# Patient Record
Sex: Female | Born: 1935
Health system: Southern US, Community
[De-identification: ages and names within clinical notes are randomized; demographics above are authoritative.]

## PROBLEM LIST (undated history)

## (undated) DIAGNOSIS — F0390 Unspecified dementia without behavioral disturbance: Secondary | ICD-10-CM

## (undated) DIAGNOSIS — I10 Essential (primary) hypertension: Secondary | ICD-10-CM

## (undated) DIAGNOSIS — E785 Hyperlipidemia, unspecified: Secondary | ICD-10-CM

## (undated) DIAGNOSIS — H3530 Unspecified macular degeneration: Secondary | ICD-10-CM

## (undated) DIAGNOSIS — E039 Hypothyroidism, unspecified: Secondary | ICD-10-CM

## (undated) DIAGNOSIS — M1990 Unspecified osteoarthritis, unspecified site: Secondary | ICD-10-CM

## (undated) DIAGNOSIS — I739 Peripheral vascular disease, unspecified: Secondary | ICD-10-CM

## (undated) DIAGNOSIS — M545 Low back pain: Secondary | ICD-10-CM

## (undated) HISTORY — PX: LUMBAR FUSION: SHX111

## (undated) HISTORY — DX: Hypothyroidism, unspecified: E03.9

## (undated) HISTORY — DX: Unspecified macular degeneration: H35.30

## (undated) HISTORY — DX: Unspecified osteoarthritis, unspecified site: M19.90

## (undated) HISTORY — PX: TOTAL KNEE ARTHROPLASTY: SHX125

## (undated) HISTORY — DX: Essential (primary) hypertension: I10

## (undated) HISTORY — DX: Low back pain: M54.5

## (undated) HISTORY — DX: Hyperlipidemia, unspecified: E78.5

## (undated) HISTORY — DX: Peripheral vascular disease, unspecified: I73.9

---

## 2007-06-29 ENCOUNTER — Ambulatory Visit: Payer: Self-pay | Admitting: Internal Medicine

## 2007-07-06 ENCOUNTER — Ambulatory Visit: Payer: Self-pay

## 2007-11-23 ENCOUNTER — Ambulatory Visit: Payer: Self-pay | Admitting: Internal Medicine

## 2007-11-23 DIAGNOSIS — I10 Essential (primary) hypertension: Secondary | ICD-10-CM

## 2007-11-23 DIAGNOSIS — E785 Hyperlipidemia, unspecified: Secondary | ICD-10-CM

## 2007-11-23 DIAGNOSIS — E039 Hypothyroidism, unspecified: Secondary | ICD-10-CM | POA: Insufficient documentation

## 2007-11-23 DIAGNOSIS — M545 Low back pain, unspecified: Secondary | ICD-10-CM

## 2007-11-23 DIAGNOSIS — M1990 Unspecified osteoarthritis, unspecified site: Secondary | ICD-10-CM

## 2007-11-23 HISTORY — DX: Hyperlipidemia, unspecified: E78.5

## 2007-11-23 HISTORY — DX: Unspecified osteoarthritis, unspecified site: M19.90

## 2007-11-23 HISTORY — DX: Hypothyroidism, unspecified: E03.9

## 2007-11-23 HISTORY — DX: Low back pain, unspecified: M54.50

## 2007-11-23 HISTORY — DX: Essential (primary) hypertension: I10

## 2007-11-26 LAB — CONVERTED CEMR LAB
CO2: 30 meq/L (ref 19–32)
Calcium: 10.4 mg/dL (ref 8.4–10.5)
Cholesterol: 236 mg/dL (ref 0–200)
GFR calc non Af Amer: 65 mL/min
Glucose, Bld: 81 mg/dL (ref 70–99)
Hemoglobin, Urine: NEGATIVE
Potassium: 4.6 meq/L (ref 3.5–5.1)
Specific Gravity, Urine: 1.015 (ref 1.000–1.03)
TSH: 5.44 microintl units/mL (ref 0.35–5.50)
Total CHOL/HDL Ratio: 2.5
Urine Glucose: NEGATIVE mg/dL
pH: 6 (ref 5.0–8.0)

## 2008-05-23 ENCOUNTER — Ambulatory Visit: Payer: Self-pay | Admitting: Internal Medicine

## 2008-05-23 DIAGNOSIS — IMO0001 Reserved for inherently not codable concepts without codable children: Secondary | ICD-10-CM

## 2008-05-23 DIAGNOSIS — D485 Neoplasm of uncertain behavior of skin: Secondary | ICD-10-CM

## 2008-05-23 DIAGNOSIS — D692 Other nonthrombocytopenic purpura: Secondary | ICD-10-CM | POA: Insufficient documentation

## 2008-07-11 ENCOUNTER — Telehealth: Payer: Self-pay | Admitting: Internal Medicine

## 2008-07-11 DIAGNOSIS — I739 Peripheral vascular disease, unspecified: Secondary | ICD-10-CM

## 2008-07-11 HISTORY — DX: Peripheral vascular disease, unspecified: I73.9

## 2008-07-19 ENCOUNTER — Encounter (INDEPENDENT_AMBULATORY_CARE_PROVIDER_SITE_OTHER): Payer: Self-pay | Admitting: *Deleted

## 2008-08-01 ENCOUNTER — Ambulatory Visit: Payer: Self-pay | Admitting: Internal Medicine

## 2008-08-01 DIAGNOSIS — R197 Diarrhea, unspecified: Secondary | ICD-10-CM

## 2008-08-02 ENCOUNTER — Encounter: Payer: Self-pay | Admitting: Internal Medicine

## 2008-08-02 LAB — CONVERTED CEMR LAB
Crystals: NEGATIVE
Specific Gravity, Urine: <=1.005 (ref 1.000–1.03)
TSH: 2.43 microintl units/mL (ref 0.35–5.50)
Total Protein, Urine: NEGATIVE mg/dL
Urine Glucose: NEGATIVE mg/dL
Urobilinogen, UA: 0.2 (ref 0.0–1.0)
pH: 7 (ref 5.0–8.0)

## 2008-08-22 ENCOUNTER — Encounter: Payer: Self-pay | Admitting: Internal Medicine

## 2008-08-24 ENCOUNTER — Ambulatory Visit: Payer: Self-pay

## 2008-08-24 ENCOUNTER — Encounter: Payer: Self-pay | Admitting: Internal Medicine

## 2008-08-31 ENCOUNTER — Ambulatory Visit: Payer: Self-pay | Admitting: Internal Medicine

## 2008-09-04 LAB — CONVERTED CEMR LAB
ALT: 26 units/L (ref 0–35)
Albumin: 4.3 g/dL (ref 3.5–5.2)
BUN: 24 mg/dL — ABNORMAL HIGH (ref 6–23)
Basophils Relative: 0.4 % (ref 0.0–3.0)
Calcium: 10 mg/dL (ref 8.4–10.5)
Cholesterol: 260 mg/dL (ref 0–200)
Creatinine, Ser: 0.9 mg/dL (ref 0.4–1.2)
Direct LDL: 199.4 mg/dL
GFR calc non Af Amer: 64 mL/min
HDL: 86.1 mg/dL (ref >39.0)
Hemoglobin: 14 g/dL (ref 12.0–15.0)
MCV: 89.6 fL (ref 78.0–100.0)
Monocytes Absolute: 0.5 10*3/uL (ref 0.1–1.0)
Monocytes Relative: 9.7 % (ref 3.0–12.0)
Potassium: 3.8 meq/L (ref 3.5–5.1)
RBC: 4.52 M/uL (ref 3.87–5.11)
Total Bilirubin: 1 mg/dL (ref 0.3–1.2)
Total CHOL/HDL Ratio: 3
VLDL: 12 mg/dL (ref 0–40)
WBC: 4.9 10*3/uL (ref 4.5–10.5)

## 2008-09-05 ENCOUNTER — Ambulatory Visit: Payer: Self-pay | Admitting: Internal Medicine

## 2009-05-25 ENCOUNTER — Ambulatory Visit: Payer: Self-pay | Admitting: Internal Medicine

## 2009-05-25 ENCOUNTER — Encounter: Payer: Self-pay | Admitting: Adult Health

## 2009-05-25 LAB — CONVERTED CEMR LAB
Bilirubin Urine: NEGATIVE
Ketones, ur: NEGATIVE mg/dL
Specific Gravity, Urine: <=1.005 (ref 1.000–1.030)
Total Protein, Urine: NEGATIVE mg/dL
Urine Glucose: NEGATIVE mg/dL
pH: 7 (ref 5.0–8.0)

## 2009-08-01 ENCOUNTER — Ambulatory Visit: Payer: Self-pay | Admitting: Internal Medicine

## 2009-08-01 LAB — CONVERTED CEMR LAB
AST: 29 units/L (ref 0–37)
Albumin: 4.2 g/dL (ref 3.5–5.2)
Alkaline Phosphatase: 88 units/L (ref 39–117)
Basophils Absolute: 0 10*3/uL (ref 0.0–0.1)
Basophils Relative: 0.7 % (ref 0.0–3.0)
Bilirubin Urine: NEGATIVE
Bilirubin, Direct: 0.1 mg/dL (ref 0.0–0.3)
Creatinine, Ser: 0.8 mg/dL (ref 0.4–1.2)
Eosinophils Absolute: 0.1 10*3/uL (ref 0.0–0.7)
Eosinophils Relative: 2 % (ref 0.0–5.0)
Glucose, Bld: 80 mg/dL (ref 70–99)
HCT: 37.9 % (ref 36.0–46.0)
Lymphocytes Relative: 34.2 % (ref 12.0–46.0)
Lymphs Abs: 1.5 10*3/uL (ref 0.7–4.0)
Neutro Abs: 2.2 10*3/uL (ref 1.4–7.7)
Platelets: 209 10*3/uL (ref 150.0–400.0)
Sodium: 141 meq/L (ref 135–145)
TSH: 2.11 microintl units/mL (ref 0.35–5.50)
Total Bilirubin: 1.1 mg/dL (ref 0.3–1.2)
Total Protein, Urine: NEGATIVE mg/dL
Urine Glucose: NEGATIVE mg/dL
Urobilinogen, UA: 0.2 (ref 0.0–1.0)
Vitamin B-12: 1100 pg/mL — ABNORMAL HIGH (ref 211–911)

## 2009-09-19 ENCOUNTER — Ambulatory Visit: Payer: Self-pay | Admitting: Internal Medicine

## 2010-04-15 ENCOUNTER — Encounter: Payer: Self-pay | Admitting: Internal Medicine

## 2010-04-19 ENCOUNTER — Encounter: Payer: Self-pay | Admitting: Internal Medicine

## 2010-05-16 ENCOUNTER — Encounter: Payer: Self-pay | Admitting: Internal Medicine

## 2010-06-14 ENCOUNTER — Ambulatory Visit: Payer: Self-pay | Admitting: Internal Medicine

## 2010-06-14 LAB — CONVERTED CEMR LAB
BUN: 22 mg/dL (ref 6–23)
Basophils Relative: 0.4 % (ref 0.0–3.0)
CO2: 28 meq/L (ref 19–32)
CRP, High Sensitivity: 7.9 — ABNORMAL HIGH (ref 0.00–5.00)
Eosinophils Relative: 1.4 % (ref 0.0–5.0)
GFR calc non Af Amer: 69.29 mL/min (ref >60)
Lymphocytes Relative: 23.1 % (ref 12.0–46.0)
Lymphs Abs: 1.4 10*3/uL (ref 0.7–4.0)
MCHC: 33.5 g/dL (ref 30.0–36.0)
Monocytes Absolute: 0.5 10*3/uL (ref 0.1–1.0)
Neutrophils Relative %: 66.3 % (ref 43.0–77.0)
RDW: 14 % (ref 11.5–14.6)

## 2010-06-20 ENCOUNTER — Ambulatory Visit: Payer: Self-pay | Admitting: Internal Medicine

## 2010-06-21 ENCOUNTER — Telehealth: Payer: Self-pay | Admitting: Internal Medicine

## 2010-07-01 ENCOUNTER — Telehealth: Payer: Self-pay | Admitting: Internal Medicine

## 2010-07-05 ENCOUNTER — Ambulatory Visit: Payer: Self-pay | Admitting: Internal Medicine

## 2010-07-08 ENCOUNTER — Telehealth: Payer: Self-pay | Admitting: Internal Medicine

## 2010-07-09 ENCOUNTER — Ambulatory Visit: Payer: Self-pay | Admitting: Family Medicine

## 2010-07-09 ENCOUNTER — Encounter: Payer: Self-pay | Admitting: Internal Medicine

## 2010-07-09 LAB — CONVERTED CEMR LAB
Basophils Absolute: 0 10*3/uL (ref 0.0–0.1)
Direct LDL: 196.2 mg/dL
Eosinophils Relative: 3.8 % (ref 0.0–5.0)
HDL: 80.8 mg/dL (ref >39.00)
Hemoglobin: 12.4 g/dL (ref 12.0–15.0)
Lymphocytes Relative: 29 % (ref 12.0–46.0)
Lymphs Abs: 1.5 10*3/uL (ref 0.7–4.0)
MCHC: 34.6 g/dL (ref 30.0–36.0)
MCV: 90.9 fL (ref 78.0–100.0)
Neutro Abs: 3 10*3/uL (ref 1.4–7.7)
Platelets: 309 10*3/uL (ref 150.0–400.0)
TSH: 2.98 microintl units/mL (ref 0.35–5.50)
Total CHOL/HDL Ratio: 4
Transferrin: 264.8 mg/dL (ref 212.0–360.0)
Triglycerides: 45 mg/dL (ref 0.0–149.0)
VLDL: 9 mg/dL (ref 0.0–40.0)
WBC: 5.2 10*3/uL (ref 4.5–10.5)

## 2010-07-10 ENCOUNTER — Telehealth: Payer: Self-pay | Admitting: Internal Medicine

## 2010-08-26 ENCOUNTER — Encounter: Payer: Self-pay | Admitting: Internal Medicine

## 2010-08-27 ENCOUNTER — Ambulatory Visit: Payer: Self-pay

## 2010-08-27 ENCOUNTER — Encounter: Payer: Self-pay | Admitting: Internal Medicine

## 2010-10-03 ENCOUNTER — Ambulatory Visit: Payer: Self-pay | Admitting: Internal Medicine

## 2010-10-03 LAB — CONVERTED CEMR LAB
Basophils Relative: 0.7 % (ref 0.0–3.0)
CO2: 26 meq/L (ref 19–32)
Calcium: 10 mg/dL (ref 8.4–10.5)
Chloride: 98 meq/L (ref 96–112)
Eosinophils Absolute: 0.1 10*3/uL (ref 0.0–0.7)
Hemoglobin: 12.4 g/dL (ref 12.0–15.0)
Lymphocytes Relative: 18 % (ref 12.0–46.0)
MCHC: 34.6 g/dL (ref 30.0–36.0)
MCV: 89.5 fL (ref 78.0–100.0)
Neutro Abs: 7.1 10*3/uL (ref 1.4–7.7)
RBC: 3.99 M/uL (ref 3.87–5.11)
Sodium: 134 meq/L — ABNORMAL LOW (ref 135–145)

## 2010-10-05 ENCOUNTER — Encounter: Payer: Self-pay | Admitting: Internal Medicine

## 2010-10-09 ENCOUNTER — Telehealth: Payer: Self-pay | Admitting: Internal Medicine

## 2010-10-14 ENCOUNTER — Telehealth: Payer: Self-pay | Admitting: Internal Medicine

## 2010-10-14 ENCOUNTER — Telehealth (INDEPENDENT_AMBULATORY_CARE_PROVIDER_SITE_OTHER): Payer: Self-pay | Admitting: *Deleted

## 2010-10-16 ENCOUNTER — Telehealth (INDEPENDENT_AMBULATORY_CARE_PROVIDER_SITE_OTHER): Payer: Self-pay | Admitting: *Deleted

## 2010-11-14 ENCOUNTER — Telehealth (INDEPENDENT_AMBULATORY_CARE_PROVIDER_SITE_OTHER): Payer: Self-pay | Admitting: *Deleted

## 2010-11-18 ENCOUNTER — Telehealth (INDEPENDENT_AMBULATORY_CARE_PROVIDER_SITE_OTHER): Payer: Self-pay | Admitting: *Deleted

## 2011-01-15 NOTE — Progress Notes (Signed)
Summary: memory   Phone Note Call from Patient Call back at Home Phone [phone] Call back at [phone] cell   Caller: Daughter-Lisa [name] Summary of Call: pt's daughter [name] called about concerns for pt. Pt is having more frequent memory episodes and cannot remember day to day events without asking someone. Also pt is c/o diarrhea and "just not feeling well." Regarding MD's advisement Initial call taken by: Lisa Grills MA,  July 10, 2010 4:18 PM  Follow-up for Phone Call        I just saw the pt with her husband - they both had no major concernes re: Lisa [name]' memory ( I've asked). I'll be happy to see her and her dtr back for an OV again to talk about memory problem again and to discuss avail Rx Follow-up by: Tresa Garter MD,  July 10, 2010 5:30 PM  Additional Follow-up for Phone Call Additional follow up Details #1::        informed pt's daughter of MD's advisement Additional Follow-up by: Lisa Grills MA,  July 11, 2010 10:52 AM

## 2011-01-15 NOTE — Assessment & Plan Note (Signed)
Summary: DR AVP PT/NO CLINIC--GROIN PAIN--STC   Vital Signs:  Patient profile:   77 year old female Height:      66 inches (167.64 cm) Weight:      126 pounds (57.27 kg) BMI:     20.41 O2 Sat:      99 % on Room air Temp:     97.6 degrees F (36.44 degrees C) oral Pulse rate:   64 / minute Pulse rhythm:   regular Resp:     16 per minute BP sitting:   130 / 90  (left arm) Cuff size:   regular  Vitals Entered By: Lanier Prude, CMA(AAMA) (June 14, 2010 10:23 AM)  O2 Flow:  Room air CC: C/O LT HIP PAIN/INFLAMM POST SCREW PLACEMENT 8 WKS AGO Is Patient Diabetic? No Pain Assessment Patient in pain? yes     Location: hip Intensity: 2 Type: sharp   Primary Care Provider:  [facility]nikov  CC:  C/O LT HIP PAIN/INFLAMM POST SCREW PLACEMENT 8 WKS AGO.  History of Present Illness: New to me she states that she had screws placed in her left hip 8 weeks ago by an Ortho. in [place], [place] due to a complication from a prior THR. She has had persistent left hip ( groin ) pain for 4 weeks now and has not improved with PT. She is taking Meloxicam, tramadol, and Tylenol with relief and she does not want anything in addition to that for pain. The pain is worsened nby any weight-bearing activity and ROM in the left hip.  Preventive Screening-Counseling & Management  Alcohol-Tobacco     Alcohol drinks/day: 0     Smoking Status: never  Hep-HIV-STD-Contraception     Hepatitis Risk: no risk noted     HIV Risk: no risk noted     STD Risk: no risk noted  Medications Prior to Update: 1)  Vitamin D3 1000 Unit  Tabs (Cholecalciferol) .Marland Kitchen.. 1 Qd 2)  Synthroid 88 Mcg Tabs (Levothyroxine Sodium) .Marland Kitchen.. 1 Po Qd 3)  Premarin 0.625 Mg/gm Crea (Estrogens, Conjugated) .... Use 1 G Pv At Bedtime X 7 D, Then 1 G Pv Q 1 Wk 4)  Amlodipine Besylate 5 Mg Tabs (Amlodipine Besylate) .... Once Daily 5)  Meloxicam 7.5 Mg Tabs (Meloxicam) .... Once Daily 6)  Ocuvite  Tabs (Multiple Vitamins-Minerals) .... Two Times A  Day 7)  Travatan 0.004 % Soln (Travoprost) .... 2 Drops in Each Eye Once Daily 8)  Penlac 8 % Soln (Ciclopirox) .... Once Daily On Effected Nail X6-12 Months 9)  Benicar Hct 20-12.5 Mg Tabs (Olmesartan Medoxomil-Hctz) .Marland Kitchen.. 1 Daily By Mouth  Current Medications (verified): 1)  Vitamin D3 1000 Unit  Tabs (Cholecalciferol) .Marland Kitchen.. 1 Qd 2)  Synthroid 88 Mcg Tabs (Levothyroxine Sodium) .Marland Kitchen.. 1 Po Qd 3)  Premarin 0.625 Mg/gm Crea (Estrogens, Conjugated) .... Use 1 G Pv At Bedtime X 7 D, Then 1 G Pv Q 1 Wk 4)  Amlodipine Besylate 5 Mg Tabs (Amlodipine Besylate) .... Once Daily 5)  Meloxicam 7.5 Mg Tabs (Meloxicam) .... Once Daily 6)  Ocuvite  Tabs (Multiple Vitamins-Minerals) .... Two Times A Day 7)  Travatan 0.004 % Soln (Travoprost) .... 2 Drops in Each Eye Once Daily 8)  Penlac 8 % Soln (Ciclopirox) .... Once Daily On Effected Nail X6-12 Months 9)  Benicar Hct 20-12.5 Mg Tabs (Olmesartan Medoxomil-Hctz) .Marland Kitchen.. 1 Daily By Mouth  Allergies (verified): 1)  ! Synthroid (Levothyroxine Sodium)  Past History:  Past Medical History: Last updated: 05/23/2008 Hyperlipidemia Hypertension  Hypothyroidism Low back pain Osteoarthritis Macular degeneration Glaucoma  Past Surgical History: Last updated: 11/23/2007 Total knee replacement B Lumbar fusion  Family History: Last updated: 11/23/2007 Family History Hypertension  Social History: Last updated: 08/01/2008 Retired - Runner, broadcasting/film/video Married Never Smoked 2 children Alcohol use-yes  Risk Factors: Alcohol Use: 0 (06/14/2010)  Risk Factors: Smoking Status: never (06/14/2010)  Family History: Reviewed history from 11/23/2007 and no changes required. Family History Hypertension  Social History: Reviewed history from 08/01/2008 and no changes required. Retired Film/video editor Married Never Smoked 2 children Alcohol use-yes Hepatitis Risk:  no risk noted HIV Risk:  no risk noted STD Risk:  no risk noted  Review of Systems       The patient  complains of difficulty walking.  The patient denies anorexia, fever, weight loss, chest pain, abdominal pain, hematuria, suspicious skin lesions, enlarged lymph nodes, and angioedema.   General:  Denies chills, fever, loss of appetite, malaise, sweats, and weakness. MS:  Complains of joint pain; denies joint redness, joint swelling, loss of strength, low back pain, mid back pain, muscle aches, cramps, muscle weakness, and stiffness.  Physical Exam  General:  disheveled and mild distress.   Head:  normocephalic and atraumatic.   Mouth:  Oral mucosa and oropharynx without lesions or exudates.  Teeth in good repair. Neck:  No deformities, masses, or tenderness noted. Lungs:  Normal respiratory effort, chest expands symmetrically. Lungs are clear to auscultation, no crackles or wheezes. Heart:  Normal rate and regular rhythm. S1 and S2 normal without gallop, murmur, click, rub or other extra sounds. Abdomen:  Bowel sounds positive,abdomen soft and non-tender without masses, organomegaly or hernias noted. Msk:  the incision sight over the left lateral hip looks good, is intact and has no ttp, erythema, exudate. there is mild anterior hip ttp in the left groin but no hernia, mass, LAD, rash, crepitance. she ambulates with slight favoring of the LLE. Pulses:  R and L carotid,radial,femoral,dorsalis pedis and posterior tibial pulses are full and equal bilaterally Extremities:  No clubbing, cyanosis, edema, or deformity noted with normal full range of motion of all joints.   Neurologic:  No cranial nerve deficits noted. Station and gait are normal. Plantar reflexes are down-going bilaterally. DTRs are symmetrical throughout. Sensory, motor and coordinative functions appear intact. Skin:  turgor normal, color normal, no rashes, no suspicious lesions, no ecchymoses, no petechiae, no purpura, no ulcerations, and no edema.   Cervical Nodes:  no anterior cervical adenopathy and no posterior cervical adenopathy.    Axillary Nodes:  no R axillary adenopathy and no L axillary adenopathy.   Inguinal Nodes:  no R inguinal adenopathy and no L inguinal adenopathy.   Psych:  Oriented X3, memory intact for recent and remote, normally interactive, good eye contact, not depressed appearing, not agitated, not suicidal, and slightly anxious.     Impression & Recommendations:  Problem # 1:  OTH COMPLICATIONS DUE INTERNAL JOINT PROSTHESIS (ICD-996.77) Assessment New plain films do not show any disruption of the hardware Orders: Venipuncture (14782) Orthopedic Referral (Ortho) TLB-BMP (Basic Metabolic Panel-BMET) (80048-METABOL) TLB-CBC Platelet - w/Differential (85025-CBCD) TLB-Sedimentation Rate (ESR) (85652-ESR) TLB-CRP-High Sensitivity (C-Reactive Protein) (86140-FCRP) T-Hip Comp Left Min 2-views (73510TC) Radiology Referral (Radiology)  Problem # 2:  HIP PAIN, LEFT (ICD-719.45) Assessment: New she has a very slight increase in CRP and ESR so will get a CT scan to look for infection Her updated medication list for this problem includes:    Meloxicam 7.5 Mg Tabs (Meloxicam) ..... Once  daily  Orders: Venipuncture (16109) Orthopedic Referral (Ortho) TLB-BMP (Basic Metabolic Panel-BMET) (80048-METABOL) TLB-CBC Platelet - w/Differential (85025-CBCD) TLB-Sedimentation Rate (ESR) (85652-ESR) TLB-CRP-High Sensitivity (C-Reactive Protein) (86140-FCRP) T-Hip Comp Left Min 2-views (73510TC) Radiology Referral (Radiology)  Complete Medication List: 1)  Vitamin D3 1000 Unit Tabs (Cholecalciferol) .Marland Kitchen.. 1 qd 2)  Synthroid 88 Mcg Tabs (Levothyroxine sodium) .Marland Kitchen.. 1 po qd 3)  Premarin 0.625 Mg/gm Crea (Estrogens, conjugated) .... Use 1 g pv at bedtime x 7 d, then 1 g pv q 1 wk 4)  Amlodipine Besylate 5 Mg Tabs (Amlodipine besylate) .... Once daily 5)  Meloxicam 7.5 Mg Tabs (Meloxicam) .... Once daily 6)  Ocuvite Tabs (Multiple vitamins-minerals) .... Two times a day 7)  Travatan 0.004 % Soln (Travoprost) ....  2 drops in each eye once daily 8)  Penlac 8 % Soln (Ciclopirox) .... Once daily on effected nail x6-12 months 9)  Benicar Hct 20-12.5 Mg Tabs (Olmesartan medoxomil-hctz) .Marland Kitchen.. 1 daily by mouth  Patient Instructions: 1)  Please schedule a follow-up appointment in 2 weeks.  Appended Document: DR AVP PT/NO CLINIC--GROIN PAIN--STC

## 2011-01-15 NOTE — Progress Notes (Signed)
Summary: repeat labs  Phone Note Call from Patient Call back at Home Phone [phone]   Summary of Call: Patient left message on triage that she will be having blood work done at [facility] and remembers that something needed to be repeated. She wanted to do it while she is there. Please advise. Initial call taken by: Lucious Groves CMA,  July 08, 2010 4:26 PM  Follow-up for Phone Call        It is on the printed Rx that she has Follow-up by: Tresa Garter MD,  July 08, 2010 10:24 PM  Additional Follow-up for Phone Call Additional follow up Details #1::        Pt called specificially requesting CRP be repeated per lab results from 06/14/2010. This test was not included on lab Rx. Okay to call lab and add with Dx 996.77? Additional Follow-up by: Margaret Pyle, CMA,  July 09, 2010 9:30 AM    Additional Follow-up for Phone Call Additional follow up Details #2::    ok Follow-up by: Tresa Garter MD,  July 09, 2010 12:58 PM  Additional Follow-up for Phone Call Additional follow up Details #3:: Details for Additional Follow-up Action Taken: Per lab order 07/09/2010 CRP to be drawn. Additional Follow-up by: Margaret Pyle, CMA,  July 09, 2010 2:19 PM

## 2011-01-15 NOTE — Progress Notes (Signed)
Summary: MRI  Phone Note Call from Patient Call back at Home Phone [phone]   Summary of Call: Pt is requesting a copy of her MRI.  She says she is moving to [facility]. Initial call taken by: Jarome Lamas,  October 14, 2010 11:31 AM

## 2011-01-15 NOTE — Letter (Signed)
Summary: Date Range:04-15-10 to 05-16-10/[facility]  Date Range:04-15-10 to 05-16-10/[facility]   Imported By: Sherian Rein 07/02/2010 10:06:01  _____________________________________________________________________  External Attachment:    Type:   Image     Comment:   External Document

## 2011-01-15 NOTE — Progress Notes (Signed)
Summary: RESULTS  Phone Note Call from Patient   Caller: Lynden Ang - daughter [phone] Summary of Call: Pt's daughter called, Patient is requesting results of all labs. Pt continues to be weak, not eating well and no appetite.   Also - patient did not discuss her memory issues at office visit? Family is concerned about memory. Initial call taken by: Lamar Sprinkles, CMA,  October 09, 2010 9:24 AM  Follow-up for Phone Call        Keep return office visit  OK to mail labs Take her to ER if very sick Follow-up by: Tresa Garter MD,  October 09, 2010 2:22 PM  Additional Follow-up for Phone Call Additional follow up Details #1::        left mess to call office back................Marland KitchenLamar Sprinkles, CMA  October 09, 2010 5:51 PM     Additional Follow-up for Phone Call Additional follow up Details #2::    I informed pt's daughter, pt is trying to get back to [facility] and will call as needed  Follow-up by: Ami Bullins CMA,  October 10, 2010 8:45 AM

## 2011-01-15 NOTE — Progress Notes (Signed)
Summary: MRI results  Phone Note Call from Patient   Caller: Patient Summary of Call: pt is requesting MRI results.  Please advise Initial call taken by: Lanier Prude, [facility]),  October 14, 2010 2:54 PM  Follow-up for Phone Call        Where was it done and who orered it?I do not see an MRI in the chart.  I see a CT report from july. Follow-up by: Tresa Garter MD,  October 14, 2010 5:34 PM  Additional Follow-up for Phone Call Additional follow up Details #1::        Pt wanted cpoies of everything done from early June to now. Pt is moving to [place] for the winter and wnats to have records on hand for any OV in [place]. Pt was advised that this would need to be done through the Medical Records dept, pt expressed understanding and was transferred to Medical Records. Additional Follow-up by: Margaret Pyle, CMA,  October 16, 2010 9:51 AM

## 2011-01-15 NOTE — Progress Notes (Signed)
Summary: RESULTS  Phone Note Call from Patient Call back at Home Phone [phone]   Summary of Call: Patient is requesting results of last labs, xray and CT.  Initial call taken by: Lamar Sprinkles, CMA,  June 21, 2010 10:28 AM  Follow-up for Phone Call        ok to provide results Keep return office visit  Follow-up by: Tresa Garter MD,  June 21, 2010 5:57 PM  Additional Follow-up for Phone Call Additional follow up Details #1::        Patient notified. Additional Follow-up by: Lucious Groves,  June 24, 2010 2:02 PM

## 2011-01-15 NOTE — Assessment & Plan Note (Signed)
Summary: diarrhea x 3 days/plot pt, plot off/cd   Vital Signs:  Patient profile:   77 year old female Menstrual status:  postmenopausal Height:      66 inches Weight:      128.50 pounds BMI:     20.82 O2 Sat:      97 % on Room air Temp:     97.8 degrees F oral Pulse rate:   69 / minute Pulse rhythm:   regular Resp:     16 per minute BP sitting:   126 / 80  (left arm) Cuff size:   large  Vitals Entered By: Rock Nephew CMA (October 03, 2010 10:01 AM)  O2 Flow:  Room air CC: Patient c/o diarrhea x several weeks, Diarrhea Is Patient Diabetic? No Pain Assessment Patient in pain? no       Does patient need assistance? Functional Status Self care Ambulation Normal     Menstrual Status postmenopausal   Primary Care Germani Gavilanes:  Plotnikov  CC:  Patient c/o diarrhea x several weeks and Diarrhea.  History of Present Illness:  Diarrhea      This is an 77 year old woman who presents with Diarrhea.  The symptoms began 4 weeks ago.  The severity is described as moderate.  The patient reports 3 stools or less per day, watery/unformed stools, voluminous stools, and gradual onset of symptoms, but denies blood in stool, mucus in stool, greasy stools, malodorous stools, fecal urgency, fecal soiling, alternating diarrhea/constipation, nocturnal diarrhea, fasting diarrhea, bloating, gassiness, and abrupt onset of symptoms.  The patient denies fever, abdominal pain, abdominal cramps, nausea, vomiting, lightheadedness, increased thirst, weight loss, joint pains, and mouth ulcers.  The symptoms are worse with any food.  The symptoms are better with fasting.  Patient's risk factors for diarrhea include recent hospitalization.    Preventive Screening-Counseling & Management  Alcohol-Tobacco     Alcohol drinks/day: 0     Smoking Status: never     Tobacco Counseling: not indicated; no tobacco use  Hep-HIV-STD-Contraception     Hepatitis Risk: no risk noted     HIV Risk: no risk noted  STD Risk: no risk noted      Sexual History:  currently monogamous.        Drug Use:  never.        Blood Transfusions:  no.    Clinical Review Panels:  Immunizations   Last Flu Vaccine:  Fluvax 3+ (09/19/2009)   Last Pneumovax:  given (12/15/2004)  Lipid Management   Cholesterol:  292 (07/09/2010)   LDL (bad choesterol):  DEL (08/31/2008)   HDL (good cholesterol):  80.80 (07/09/2010)  Diabetes Management   Creatinine:  0.8 (06/14/2010)   Last Flu Vaccine:  Fluvax 3+ (09/19/2009)   Last Pneumovax:  given (12/15/2004)  CBC   WBC:  5.2 (07/09/2010)   RBC:  3.94 (07/09/2010)   Hgb:  12.4 (07/09/2010)   Hct:  35.8 (07/09/2010)   Platelets:  309.0 (07/09/2010)   MCV  90.9 (07/09/2010)   MCHC  34.6 (07/09/2010)   RDW  14.8 (07/09/2010)   PMN:  57.9 (07/09/2010)   Lymphs:  29.0 (07/09/2010)   Monos:  8.8 (07/09/2010)   Eosinophils:  3.8 (07/09/2010)   Basophil:  0.5 (07/09/2010)  Complete Metabolic Panel   Glucose:  84 (06/14/2010)   Sodium:  135 (06/14/2010)   Potassium:  4.6 (06/14/2010)   Chloride:  97 (06/14/2010)   CO2:  28 (06/14/2010)   BUN:  22 (06/14/2010)   Creatinine:  0.8 (06/14/2010)   Albumin:  4.2 (08/01/2009)   Total Protein:  7.8 (08/01/2009)   Calcium:  9.7 (06/14/2010)   Total Bili:  1.1 (08/01/2009)   Alk Phos:  88 (08/01/2009)   SGPT (ALT):  32 (08/01/2009)   SGOT (AST):  29 (08/01/2009)   Medications Prior to Update: 1)  Vitamin D3 1000 Unit  Tabs (Cholecalciferol) .Marland Kitchen.. 1 Qd 2)  Synthroid 88 Mcg Tabs (Levothyroxine Sodium) .Marland Kitchen.. 1 Po Qd 3)  Amlodipine Besylate 5 Mg Tabs (Amlodipine Besylate) .... Once Daily 4)  Meloxicam 7.5 Mg Tabs (Meloxicam) .... Once Daily 5)  Ocuvite  Tabs (Multiple Vitamins-Minerals) .... Two Times A Day 6)  Travatan 0.004 % Soln (Travoprost) .... 2 Drops in Each Eye Once Daily 7)  Vit B12, Lipids, Tsh, Vit D, Cbc, Iron/tibc .... Dx V70.0 782.0 780.79  Current Medications (verified): 1)  Vitamin D3 1000 Unit  Tabs  (Cholecalciferol) .Marland Kitchen.. 1 Qd 2)  Synthroid 88 Mcg Tabs (Levothyroxine Sodium) .Marland Kitchen.. 1 Po Qd 3)  Amlodipine Besylate 5 Mg Tabs (Amlodipine Besylate) .... Once Daily 4)  Meloxicam 7.5 Mg Tabs (Meloxicam) .... Once Daily 5)  Ocuvite  Tabs (Multiple Vitamins-Minerals) .... Two Times A Day 6)  Travatan 0.004 % Soln (Travoprost) .... 2 Drops in Each Eye Once Daily 7)  Vit B12, Lipids, Tsh, Vit D, Cbc, Iron/tibc .... Dx V70.0 782.0 780.79  Allergies (verified): 1)  ! 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 Family History Hypertension  Social History: Reviewed history from 08/01/2008 and no changes required. 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    Physical Exam  General:  alert, well-developed, well-nourished, well-hydrated, appropriate dress, normal appearance, and healthy-appearing.   Eyes:  no icterus, she does have blepharitis Mouth:  Oral mucosa and oropharynx without lesions or exudates.   Teeth in good repair. pharynx pink and moist.   Neck:  supple, full ROM, and no masses.   Lungs:  normal respiratory effort, no intercostal retractions, no accessory muscle use, normal breath sounds, no dullness, no fremitus, no crackles, and no wheezes.   Heart:  Normal rate and regular rhythm. S1 and S2 normal without gallop, murmur, click, rub or other extra sounds. Abdomen:  soft, non-tender, no distention, no masses, no guarding, no rigidity, no rebound tenderness, no abdominal hernia, no inguinal hernia, no hepatomegaly, no splenomegaly, and bowel sounds hyperactive.   Msk:  No deformity or scoliosis noted of thoracic or lumbar spine.   Pulses:  R and L carotid,radial,femoral,dorsalis pedis and posterior tibial pulses are full and equal bilaterally Extremities:  No clubbing, cyanosis, edema, or deformity noted with normal full range of motion of all joints.   Neurologic:  No cranial nerve deficits noted. Station and gait are normal.  Plantar reflexes are down-going bilaterally. DTRs are symmetrical throughout. Sensory, motor and coordinative functions appear intact. Skin:  turgor normal, color normal, no rashes, no suspicious lesions, no ecchymoses, no petechiae, no purpura, no ulcerations, and no edema.   Cervical Nodes:  no anterior cervical adenopathy and no posterior cervical adenopathy.   Inguinal Nodes:  no R inguinal adenopathy and no L inguinal adenopathy.   Psych:  Oriented X3, memory intact for recent and remote, normally interactive, good eye contact, not depressed appearing, not agitated, not suicidal, and not anxious.     Impression & Recommendations:  Problem # 1:  DIARRHEA (ICD-787.91) Assessment Deteriorated will check for infection, malabsorption, celiac dz.,  Orders: T-Sprue Panel (Celiac Disease Aby Eval) (83516x3/[address]) T-Stool Fats [place] Stain [phone]) T-Stool Giardia / Crypto- EIA (14782) T-Stool for O&P (95621-30865) T-Fecal WBC (78469-62952) T-Culture,  C-Diff Toxin A/B (84132-44010) T-Beta Carotene (27253-66440) Venipuncture (34742) TLB-BMP (Basic Metabolic Panel-BMET) (80048-METABOL) TLB-CBC Platelet - w/Differential (85025-CBCD) TLB-TSH (Thyroid Stimulating Hormone) (84443-TSH)  Problem # 2:  HYPOTHYROIDISM (ICD-244.9) Assessment: Unchanged  Her updated medication list for this problem includes:    Synthroid 88 Mcg Tabs (Levothyroxine sodium) .Marland Kitchen... 1 po qd  Orders: Venipuncture (59563) TLB-BMP (Basic Metabolic Panel-BMET) (80048-METABOL) TLB-CBC Platelet - w/Differential (85025-CBCD) TLB-TSH (Thyroid Stimulating Hormone) (84443-TSH)  Labs Reviewed: TSH: 2.98 (07/09/2010)    Chol: 292 (07/09/2010)   HDL: 80.80 (07/09/2010)   LDL: DEL (08/31/2008)   TG: 45.0 (07/09/2010)  Problem # 3:  HYPERTENSION (ICD-401.9) Assessment: Improved  Her updated medication list for this problem includes:    Amlodipine Besylate 5 Mg Tabs (Amlodipine besylate) ..... Once daily  Orders: Venipuncture (87564) TLB-BMP (Basic Metabolic Panel-BMET) (80048-METABOL) TLB-CBC Platelet - w/Differential (85025-CBCD) TLB-TSH (Thyroid Stimulating Hormone) (84443-TSH)  BP today: 126/80 Prior BP: 130/60 (07/05/2010)  Labs Reviewed: K+: 4.6 (06/14/2010) Creat: : 0.8 (06/14/2010)   Chol: 292 (07/09/2010)   HDL: 80.80 (07/09/2010)   LDL: DEL (08/31/2008)   TG: 45.0 (07/09/2010)  Complete Medication List: 1)  Vitamin D3 1000 Unit Tabs (Cholecalciferol) .Marland Kitchen.. 1 qd 2)  Synthroid 88 Mcg Tabs (Levothyroxine sodium) .Marland Kitchen.. 1 po qd 3)  Amlodipine Besylate 5 Mg Tabs (Amlodipine besylate) .... Once daily 4)  Meloxicam 7.5 Mg Tabs (Meloxicam) .... Once daily 5)  Ocuvite Tabs (Multiple vitamins-minerals) .... Two times a day 6)  Travatan 0.004 % Soln (Travoprost) .... 2 drops in each eye once daily 7)  Vit B12, Lipids, Tsh, Vit D, Cbc, Iron/tibc  .... Dx v70.0 782.0 780.79  Patient Instructions: 1)  Please schedule a follow-up appointment in 2 weeks. 2)  teh main  problem with gastroenteritis is dehydration. Drink plenty of fluids and take solids as you feel better. If you are unable to keep anything down and/or you show signs of dehydration(dry/cracked lips, lack of tears, not urinating, very sleepy), call our office.   Orders Added: 1)  T-Sprue Panel (Celiac Disease Aby Eval) [83516x3/[address]] 2)  T-Stool Fats [place] Stain [33295-18841] 3)  T-Stool Giardia / Crypto- EIA [66063] 4)  T-Stool for O&P [87177-70555] 5)  T-Fecal WBC [01601-09323] 6)  T-Culture, C-Diff Toxin A/B [55732-20254] 7)  T-Beta Carotene [82380-81790] 8)  Venipuncture [36415] 9)  TLB-BMP (Basic Metabolic Panel-BMET) [80048-METABOL] 10)  TLB-CBC Platelet - w/Differential [85025-CBCD] 11)  TLB-TSH (Thyroid Stimulating Hormone) [84443-TSH] 12)  Est. Patient Level IV [27062]

## 2011-01-15 NOTE — Miscellaneous (Signed)
Summary: Orders Update  Clinical Lists Changes  Orders: Added new Test order of Carotid Duplex (Carotid Duplex) - Signed 

## 2011-01-15 NOTE — Assessment & Plan Note (Signed)
Summary: 6 mos f/u #/cd   Vital Signs:  Patient profile:   77 year old female Height:      66 inches Weight:      129 pounds BMI:     20.90 O2 Sat:      99 % on Room air Temp:     97.2 degrees F oral Pulse rate:   67 / minute Pulse rhythm:   regular BP sitting:   130 / 60  (left arm) Cuff size:   regular  Vitals Entered By: Lanier Prude, CMA(AAMA) (July 05, 2010 9:16 AM)  O2 Flow:  Room air CC: 6 mo f/u Comments pt needs Rf on all meds. She is not taking Premarin, Ocuvite, Penlac or Benicar. Please remove from list   Primary Care Provider:  Plotnikov  CC:  6 mo f/u.  History of Present Illness: C/o L groin pain - not better; worse with moving x 2 wk C/o anemia  The patient presents for a follow up of hypertension, hyperlipidemia   Current Medications (verified): 1)  Vitamin D3 1000 Unit  Tabs (Cholecalciferol) .Marland Kitchen.. 1 Qd 2)  Synthroid 88 Mcg Tabs (Levothyroxine Sodium) .Marland Kitchen.. 1 Po Qd 3)  Premarin 0.625 Mg/gm Crea (Estrogens, Conjugated) .... Use 1 G Pv At Bedtime X 7 D, Then 1 G Pv Q 1 Wk 4)  Amlodipine Besylate 5 Mg Tabs (Amlodipine Besylate) .... Once Daily 5)  Meloxicam 7.5 Mg Tabs (Meloxicam) .... Once Daily 6)  Ocuvite  Tabs (Multiple Vitamins-Minerals) .... Two Times A Day 7)  Travatan 0.004 % Soln (Travoprost) .... 2 Drops in Each Eye Once Daily 8)  Penlac 8 % Soln (Ciclopirox) .... Once Daily On Effected Nail X6-12 Months 9)  Benicar Hct 20-12.5 Mg Tabs (Olmesartan Medoxomil-Hctz) .Marland Kitchen.. 1 Daily By Mouth  Allergies (verified): 1)  ! Synthroid (Levothyroxine Sodium)  Past History:  Past Medical History: Last updated: 05/23/2008 Hyperlipidemia Hypertension Hypothyroidism Low back pain Osteoarthritis Macular degeneration Glaucoma  Past Surgical History: Last updated: 11/23/2007 Total knee replacement B Lumbar fusion  Social History: Last updated: 08/01/2008 Retired - Runner, broadcasting/film/video Married Never Smoked 2 children Alcohol use-yes  Review of  Systems  The patient denies fever, weight loss, weight gain, dyspnea on exertion, and abdominal pain.    Physical Exam  General:  disheveled and mild distress.   Eyes:  Less redness of the eyes Nose:  External nasal examination shows no deformity or inflammation. Nasal mucosa are pink and moist without lesions or exudates. Mouth:  Oral mucosa and oropharynx without lesions or exudates.  Teeth in good repair. Neck:  No deformities, masses, or tenderness noted. Lungs:  Normal respiratory effort, chest expands symmetrically. Lungs are clear to auscultation, no crackles or wheezes. Heart:  Normal rate and regular rhythm. S1 and S2 normal without gallop, murmur, click, rub or other extra sounds. Abdomen:  Bowel sounds positive,abdomen soft and non-tender without masses, organomegaly or hernias noted. Msk:  the incision sight over the left lateral hip looks good, is intact and has no ttp, erythema, exudate. there is mild anterior hip ttp in the left groin but no hernia, mass, LAD, rash, crepitance. she ambulates with slight favoring of the LLE. L groin is tender to palp Extremities:  No clubbing, cyanosis, edema, or deformity noted with normal full range of motion of all joints.   Neurologic:  No cranial nerve deficits noted. Station and gait are normal. Plantar reflexes are down-going bilaterally. DTRs are symmetrical throughout. Sensory, motor and coordinative functions appear intact.  Impression & Recommendations:  Problem # 1:  HIP PAIN, LEFT (ICD-719.45)/L groin pain - poss ileopsoas tendonitis Assessment New  Her updated medication list for this problem includes:    Meloxicam 7.5 Mg Tabs (Meloxicam) ..... Once daily Hip CT was OK See "Patient Instructions".  Offered Prednisone or Flexeril  Orders: TLB-B12, Serum-Total ONLY (13086-V78) TLB-CBC Platelet - w/Differential (85025-CBCD) TLB-IBC Pnl (Iron/FE;Transferrin) (83550-IBC) TLB-TSH (Thyroid Stimulating Hormone)  (84443-TSH) T-Vitamin D (25-Hydroxy) (46962-95284)  Problem # 2:  FATIGUE (ICD-780.79) Assessment: Unchanged  Labs is pending   Problem # 3:  PARESTHESIA (ICD-782.0) Assessment: Comment Only Check B12  Problem # 4:  REDNESS OR DISCHARGE OF EYE (ICD-379.93) Assessment: Improved  Problem # 5:  HYPERTENSION (ICD-401.9) Assessment: Unchanged  The following medications were removed from the medication list:    Benicar Hct 20-12.5 Mg Tabs (Olmesartan medoxomil-hctz) .Marland Kitchen... 1 daily by mouth Her updated medication list for this problem includes:    Amlodipine Besylate 5 Mg Tabs (Amlodipine besylate) ..... Once daily  BP today: 130/60 Prior BP: 130/90 (06/14/2010)  Labs Reviewed: K+: 4.6 (06/14/2010) Creat: : 0.8 (06/14/2010)   Chol: 260 (08/31/2008)   HDL: 86.1 (08/31/2008)   LDL: DEL (08/31/2008)   TG: 58 (08/31/2008)  Problem # 6:  HYPERLIPIDEMIA (ICD-272.4) Assessment: Comment Only  Complete Medication List: 1)  Vitamin D3 1000 Unit Tabs (Cholecalciferol) .Marland Kitchen.. 1 qd 2)  Synthroid 88 Mcg Tabs (Levothyroxine sodium) .Marland Kitchen.. 1 po qd 3)  Amlodipine Besylate 5 Mg Tabs (Amlodipine besylate) .... Once daily 4)  Meloxicam 7.5 Mg Tabs (Meloxicam) .... Once daily 5)  Ocuvite Tabs (Multiple vitamins-minerals) .... Two times a day 6)  Travatan 0.004 % Soln (Travoprost) .... 2 drops in each eye once daily 7)  Vit B12, Lipids, Tsh, Vit D, Cbc, Iron/tibc  .... Dx v70.0 782.0 780.79  Other Orders: Prescription Created Electronically [phone])  Patient Instructions: 1)  Use a body pillow or a blanket roll to sleep with 2)  Use stretching and balance exercises that I have provided (15 min. or longer every day) 3)  Please schedule a follow-up appointment in 2 months. 4)  Ileopsoas strain Prescriptions: VIT B12, LIPIDS, TSH, VIT D, CBC, IRON/TIBC Dx v70.0 782.0 780.79  #1 x 0   Entered and Authorized by:   Tresa Garter MD   Signed by:   Tresa Garter MD on 07/05/2010   Method used:    Print then Give to Patient   RxID:   [id number] MELOXICAM 7.5 MG TABS (MELOXICAM) once daily  #90 x 4   Entered and Authorized by:   Tresa Garter MD   Signed by:   Tresa Garter MD on 07/05/2010   Method used:   Print then Give to Patient   RxID:   [id number] AMLODIPINE BESYLATE 5 MG TABS (AMLODIPINE BESYLATE) once daily  #90 x 4   Entered and Authorized by:   Tresa Garter MD   Signed by:   Tresa Garter MD on 07/05/2010   Method used:   Print then Give to Patient   RxID:   [id number] SYNTHROID 88 MCG TABS (LEVOTHYROXINE SODIUM) 1 po qd  #90 x 4   Entered and Authorized by:   Tresa Garter MD   Signed by:   Tresa Garter MD on 07/05/2010   Method used:   Print then Give to Patient   RxID:   [id number] VITAMIN D3 1000 UNIT  TABS (CHOLECALCIFEROL) 1 qd  #90 x 4   Entered and  Authorized by:   Tresa Garter MD   Signed by:   Tresa Garter MD on 07/05/2010   Method used:   Print then Give to Patient   RxID:   [id number] MELOXICAM 7.5 MG TABS (MELOXICAM) once daily  #90 x 4   Entered and Authorized by:   Tresa Garter MD   Signed by:   Tresa Garter MD on 07/05/2010   Method used:   Electronically to        [facility]  [address] (retail)       [address]       [place], [place]  [address]       Ph: [phone] or [phone]       Fax: [phone]   RxID:   [id number] AMLODIPINE BESYLATE 5 MG TABS (AMLODIPINE BESYLATE) once daily  #90 x 4   Entered and Authorized by:   Tresa Garter MD   Signed by:   Tresa Garter MD on 07/05/2010   Method used:   Electronically to        [facility]  [address] (retail)       [address]       [place], [place]  [address]       Ph: [phone] or [phone]       Fax: [phone]   RxID:   [id number] SYNTHROID 88 MCG TABS (LEVOTHYROXINE SODIUM) 1 po qd  #90 x 4   Entered and Authorized by:   Tresa Garter  MD   Signed by:   Tresa Garter MD on 07/05/2010   Method used:   [facility] to        [facility]  [address] (retail)       [address]       [place], [place]  [address]       Ph: [phone] or [phone]       Fax: [phone]   RxID:   [id number] VITAMIN D3 1000 UNIT  TABS (CHOLECALCIFEROL) 1 qd  #90 x 4   Entered and Authorized by:   Tresa Garter MD   Signed by:   Tresa Garter MD on 07/05/2010   Method used:   [facility]ron[facility] to        [facility] (retail)       [address]       [place], [place]  [address]       Ph: [phone] or [phone]       Fax: [phone]   RxID:   [id number]

## 2011-01-15 NOTE — Progress Notes (Signed)
  Phone Note Other Incoming   Request: Send information Summary of Call: Request received from [facility] forwarded to [employer].

## 2011-01-15 NOTE — Op Note (Signed)
Summary: [facility]  [facility]   Imported By: Sherian Rein 07/02/2010 10:15:42  _____________________________________________________________________  External Attachment:    Type:   Image     Comment:   External Document

## 2011-01-15 NOTE — Progress Notes (Signed)
  Phone Note Other Incoming   Request: Send information Summary of Call: Returned message from patient. Patient is leaving for [place], [place] and would like copies of her records from 06/2010 to present. She is unable to stop by and complete the form because she is leaving tomorrow. I offered to fax or e-mail her a release. The patient states, she does not have a fax and her e-mail is shut off until she arrives to [place]. The patient is requesting that the release form be mailed to [address] [place], [place] [address]. This was placed in the mail this morning.

## 2011-01-15 NOTE — Progress Notes (Signed)
  Phone Note Other Incoming   Request: Send information Summary of Call: [facility] faxed a release requesting and MRI report. Release was faxed back to [phone] with included statement that records requested were unavailable. There is no MRI report in the patient's chart nor in EMR.

## 2011-01-15 NOTE — Progress Notes (Signed)
Summary: resuklts  Phone Note Call from Patient   Caller: Patient Reason for Call: Lab or Test Results Summary of Call: Patient called requesting status of lab results/ she states she never received in the mail.Alvy Beal Archie CMA  July 01, 2010 9:05 AM   Follow-up for Phone Call        Mailed today, Pt informed  Follow-up by: Lamar Sprinkles, CMA,  July 01, 2010 3:21 PM

## 2011-04-29 NOTE — Assessment & Plan Note (Signed)
[facility]                           PRIMARY CARE OFFICE NOTE   Lisa [name], Lisa [name]                        MRN:          [id number]  DATE:06/30/2007                            DOB:          12-31-1927    The patient is a 76 year old female, who presents to establish primary  with me and to address chronic medical problems, including hypertension,  osteoarthritis, hypothyroidism and others.   PAST MEDICAL HISTORY:  Total knee replacement bilaterally, total hip  replacement bilaterally, history of spinal fusion, hypothyroidism,  hypertension.   ALLERGIES:  SYNTHROID, CERTAIN EYE DROPS.   CURRENT MEDICATIONS:  1. Doxycycline.  2. Cream for eyes.  3. Clindamycin eye drops p.r.n.  4. Synthroid 75 micrograms daily.  5. Meloxicam 7.5 mg daily.  6. Amlodipine 5 mg daily.  7. Ocuvite 2 drops each eye twice a day.  8. Travatan eye drops.  9. Centrum Silver.  10.Calcium and D.  11.Vitamin C.  12.Vitamin E.  13.Vitamin B12.  14.Red rice yeast extract for cholesterol.   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  Does not smoke.  No alcohol.  She has two children, moved  here from [place].   FAMILY HISTORY:  Father died with diabetes complications.  Mother died  with possible coronary disease or valve disease.   REVIEW OF SYSTEMS:  She has been exercising on a stationary bike.  Chronic joint problems with back and lower extremity pain.  Chronic  inflammation of the eyelids.  Health maintenance status seems to be up  to date.  The rest of the review of systems is negative.   PHYSICAL:  Blood pressure 167/68, pulse 61, temperature 98, weight 151  pounds.  She is in no acute distress.  HEENT:  Reveals erythematous eyelids bilaterally.  Eyeballs without  obvious inflammation.  NECK:  Supple, no thyromegaly.  Bruit on the left side.  LUNGS:  Clear, no wheezes or rales.  HEART:  S1, S2, no murmur, no  gallops.  ABDOMEN:  Soft, nontender.  LOWER EXTREMITIES:  Without edema.  She is alert and cooperative.  Denies being depressed.  SKIN:  Clear.   LABORATORY DATA:  On March 10, 2007, TSH 2.07, HDL 71, LDL 126, glucose  96.   ASSESSMENT AND PLAN:  1. Hypertension.  Continue current therapy.  2. Osteoarthritis.  Continue current therapy.  3. Hypothyroidism.  Continue current therapy.  4. Chronic conjunctivitis/blepharitis, chronic on therapy.  5. Elevated blood pressure.  States normal at home.  Call if elevated.      I will see her back in three months with laboratory work.  6. Carotid left bruit.  We will obtain ultrasound.  7. Health maintenance:  Colonoscopy three years ago.  Had Pap      smear/mammogram/chest x-ray recently.  Bone density scan normal in      April 2007.  Zostavax information provided.  Other shots up to      date.     Georgina Quint. Plotnikov, MD  Electronically Signed    AVP/MedQ  DD: 07/01/2007  DT:  07/02/2007  Job #: [id number]

## 2011-06-03 ENCOUNTER — Other Ambulatory Visit: Payer: Self-pay | Admitting: Internal Medicine

## 2011-06-24 ENCOUNTER — Telehealth: Payer: Self-pay | Admitting: *Deleted

## 2011-06-24 NOTE — Telephone Encounter (Signed)
Pt has apt tomorrow. Spoke w/daughter (who can not come w/her tomorrow)  1. Pt has worries about driving but husband "likes her to drive". Pt does not feel comfortable b/c she says she "cant see" 2. Pt has had 4 episodes where she "goes out" - non responsive for a couple minutes. Not all are after standing. One was after being in hot car, another while sitting at dinner table, and another after eating large meal. She was seen in hospital in [place]. Dx low sodium and kept overnight. EMS was called last Sunday to last episode but pt did not go to hospital. They are worried about her heart.  3. Pt constantly c/o "feeling like a wet dish rag"

## 2011-06-24 NOTE — Telephone Encounter (Signed)
Noted. Thx.

## 2011-06-25 ENCOUNTER — Ambulatory Visit: Payer: Self-pay | Admitting: Internal Medicine

## 2011-06-25 ENCOUNTER — Encounter: Payer: Self-pay | Admitting: Internal Medicine

## 2011-06-25 ENCOUNTER — Ambulatory Visit (INDEPENDENT_AMBULATORY_CARE_PROVIDER_SITE_OTHER): Payer: Medicare Other | Admitting: Internal Medicine

## 2011-06-25 VITALS — BP 150/70 | HR 80 | Temp 97.6°F | Resp 16 | Wt 130.0 lb

## 2011-06-25 DIAGNOSIS — F0280 Dementia in other diseases classified elsewhere without behavioral disturbance: Secondary | ICD-10-CM

## 2011-06-25 DIAGNOSIS — E871 Hypo-osmolality and hyponatremia: Secondary | ICD-10-CM | POA: Insufficient documentation

## 2011-06-25 DIAGNOSIS — R55 Syncope and collapse: Secondary | ICD-10-CM | POA: Insufficient documentation

## 2011-06-25 DIAGNOSIS — G309 Alzheimer's disease, unspecified: Secondary | ICD-10-CM

## 2011-06-25 NOTE — Patient Instructions (Signed)
Stop Aricept please Do not drive

## 2011-06-25 NOTE — Assessment & Plan Note (Signed)
EKG w/S brady Hosp records and labs and tests from [facility] were reviewed RTC 3 wks Stop Aricept Card cons if sx not resolved

## 2011-06-25 NOTE — Assessment & Plan Note (Signed)
Reduce water intake and recheck BMET in 3 wks

## 2011-06-25 NOTE — Progress Notes (Signed)
  Subjective:    Patient ID: Lisa [name], female    DOB: Oct 14, 1928, 77 y.o.   MRN: [id number]  HPI   1. Pt has worries about driving but husband "likes her to drive". Pt does not feel comfortable b/c she says she "cant see"  2. Pt has had 4 episodes where she "passed out" - non responsive for a couple minutes. Never hit her head. No warnings. Not all are after standing. One was after being in hot car, another while sitting at dinner table, and another after eating large meal. She was seen in hospital in [place], [place] for syncope #1. Dx low sodium and kept overnight. EMS was called last Sunday to last episode but pt did not go to hospital . They are worried about her heart.  3. Pt constantly c/o "feeling like a wet dish rag" however denied it today  The only new med is a dementia med started 3-4 mo ago    Review of Systems  Constitutional: Negative for chills, activity change, appetite change, fatigue and unexpected weight change.  HENT: Negative for congestion, mouth sores and sinus pressure.   Eyes: Negative for visual disturbance.  Respiratory: Negative for cough, chest tightness and shortness of breath.   Cardiovascular: Negative for chest pain, palpitations and leg swelling.  Gastrointestinal: Negative for nausea, abdominal pain, blood in stool and abdominal distention.  Genitourinary: Negative for dysuria, urgency, frequency, difficulty urinating and vaginal pain.  Musculoskeletal: Negative for back pain and gait problem.  Skin: Negative for pallor and rash.  Neurological: Negative for dizziness, tremors, seizures, weakness, light-headedness, numbness and headaches.  Psychiatric/Behavioral: Negative for confusion and sleep disturbance. The patient is not nervous/anxious.        Objective:   Physical Exam  Constitutional: She appears well-developed and well-nourished. No distress.       NAD  HENT:  Head: Normocephalic.  Right Ear: External ear normal.  Left Ear: External ear  normal.  Nose: Nose normal.  Mouth/Throat: Oropharynx is clear and moist.  Eyes: Conjunctivae are normal. Pupils are equal, round, and reactive to light. Right eye exhibits no discharge. Left eye exhibits no discharge.  Neck: Normal range of motion. Neck supple. No JVD present. No tracheal deviation present. No thyromegaly present.  Cardiovascular: Normal rate, regular rhythm and normal heart sounds.  Exam reveals no gallop.   No murmur heard. Pulmonary/Chest: No stridor. No respiratory distress. She has no wheezes.  Abdominal: Soft. Bowel sounds are normal. She exhibits no distension and no mass. There is no tenderness. There is no rebound and no guarding.  Musculoskeletal: She exhibits no edema and no tenderness.  Lymphadenopathy:    She has no cervical adenopathy.  Neurological: She displays normal reflexes. No cranial nerve deficit. She exhibits normal muscle tone. Coordination (ataxic) abnormal.  Skin: No rash noted. No erythema.  Psychiatric: She has a normal mood and affect. Her behavior is normal. Judgment and thought content normal.          Assessment & Plan:

## 2011-06-25 NOTE — Assessment & Plan Note (Signed)
May try Exelon later

## 2011-06-26 ENCOUNTER — Other Ambulatory Visit: Payer: Self-pay | Admitting: *Deleted

## 2011-06-26 MED ORDER — AMLODIPINE BESYLATE 5 MG PO TABS: 5.0000 mg | ORAL_TABLET | Freq: Every day | ORAL | Status: DC

## 2011-06-26 MED ORDER — LEVOTHYROXINE SODIUM 88 MCG PO TABS: 88.0000 ug | ORAL_TABLET | Freq: Every day | ORAL | Status: DC

## 2011-06-27 ENCOUNTER — Other Ambulatory Visit: Payer: Self-pay | Admitting: *Deleted

## 2011-06-27 MED ORDER — LEVOTHYROXINE SODIUM 88 MCG PO TABS: 88.0000 ug | ORAL_TABLET | Freq: Every day | ORAL | Status: DC

## 2011-06-27 MED ORDER — AMLODIPINE BESYLATE 5 MG PO TABS: 5.0000 mg | ORAL_TABLET | Freq: Every day | ORAL | Status: DC

## 2011-07-03 ENCOUNTER — Other Ambulatory Visit (INDEPENDENT_AMBULATORY_CARE_PROVIDER_SITE_OTHER): Payer: Medicare Other

## 2011-07-03 DIAGNOSIS — R55 Syncope and collapse: Secondary | ICD-10-CM

## 2011-07-03 LAB — BASIC METABOLIC PANEL
BUN: 28 mg/dL — ABNORMAL HIGH (ref 6–23)
Chloride: 103 mEq/L (ref 96–112)
Glucose, Bld: 82 mg/dL (ref 70–99)
Potassium: 4.5 mEq/L (ref 3.5–5.1)
Sodium: 138 mEq/L (ref 135–145)

## 2011-07-22 ENCOUNTER — Ambulatory Visit (INDEPENDENT_AMBULATORY_CARE_PROVIDER_SITE_OTHER): Payer: Medicare Other | Admitting: Internal Medicine

## 2011-07-22 ENCOUNTER — Encounter: Payer: Self-pay | Admitting: Internal Medicine

## 2011-07-22 DIAGNOSIS — R5381 Other malaise: Secondary | ICD-10-CM

## 2011-07-22 DIAGNOSIS — I10 Essential (primary) hypertension: Secondary | ICD-10-CM

## 2011-07-22 DIAGNOSIS — R55 Syncope and collapse: Secondary | ICD-10-CM

## 2011-07-22 DIAGNOSIS — E871 Hypo-osmolality and hyponatremia: Secondary | ICD-10-CM

## 2011-07-22 DIAGNOSIS — R5383 Other fatigue: Secondary | ICD-10-CM | POA: Insufficient documentation

## 2011-07-22 DIAGNOSIS — E039 Hypothyroidism, unspecified: Secondary | ICD-10-CM

## 2011-07-22 DIAGNOSIS — F0280 Dementia in other diseases classified elsewhere without behavioral disturbance: Secondary | ICD-10-CM

## 2011-07-22 DIAGNOSIS — G309 Alzheimer's disease, unspecified: Secondary | ICD-10-CM

## 2011-07-22 MED ORDER — MEMANTINE HCL 10 MG PO TABS: 10.0000 mg | ORAL_TABLET | Freq: Two times a day (BID) | ORAL | Status: DC

## 2011-07-22 NOTE — Assessment & Plan Note (Signed)
On Rx 

## 2011-07-22 NOTE — Assessment & Plan Note (Signed)
Will try Namenda

## 2011-07-22 NOTE — Assessment & Plan Note (Signed)
Take Norvasc at hs 

## 2011-07-22 NOTE — Assessment & Plan Note (Signed)
Will cont to watch 

## 2011-07-22 NOTE — Assessment & Plan Note (Signed)
Take Norvasc at hs due to fatigue

## 2011-07-22 NOTE — Assessment & Plan Note (Signed)
Resolved

## 2011-07-22 NOTE — Progress Notes (Signed)
  Subjective:    Patient ID: Lisa [name], female    DOB: [birth year], 77 y.o.   MRN: [id number]  HPI  F/u syncope - stopped F/u dementia - no change C/o fatigue  Review of Systems  Constitutional: Positive for fatigue. Negative for chills, activity change, appetite change and unexpected weight change.  HENT: Negative for congestion, mouth sores and sinus pressure.   Eyes: Negative for visual disturbance.  Respiratory: Negative for cough and chest tightness.   Gastrointestinal: Negative for nausea and abdominal pain.  Genitourinary: Negative for frequency, difficulty urinating and vaginal pain.  Musculoskeletal: Negative for back pain and gait problem.  Skin: Negative for pallor and rash.  Neurological: Negative for dizziness, tremors, weakness, numbness and headaches.  Psychiatric/Behavioral: Negative for confusion and sleep disturbance.   BP Readings from Last 3 Encounters:  07/22/11 150/70  06/25/11 150/70  10/03/10 126/80   Wt Readings from Last 3 Encounters:  07/22/11 133 lb (60.328 kg)  06/25/11 130 lb (58.968 kg)  10/03/10 128 lb 8 oz (58.287 kg)       Objective:   Physical Exam  Constitutional: She appears well-developed and well-nourished. No distress.  HENT:  Head: Normocephalic.  Right Ear: External ear normal.  Left Ear: External ear normal.  Nose: Nose normal.  Mouth/Throat: Oropharynx is clear and moist.       blepharitis  Eyes: Conjunctivae are normal. Pupils are equal, round, and reactive to light. Right eye exhibits no discharge. Left eye exhibits no discharge.  Neck: Normal range of motion. Neck supple. No JVD present. No tracheal deviation present. No thyromegaly present.  Cardiovascular: Normal rate, regular rhythm and normal heart sounds.   Pulmonary/Chest: No stridor. No respiratory distress. She has no wheezes.  Abdominal: Soft. Bowel sounds are normal. She exhibits no distension and no mass. There is no tenderness. There is no rebound and no  guarding.  Musculoskeletal: She exhibits no edema and no tenderness.  Lymphadenopathy:    She has no cervical adenopathy.  Neurological: She displays normal reflexes. No cranial nerve deficit. She exhibits normal muscle tone. Coordination normal.  Skin: No rash noted. No erythema.  Psychiatric: She has a normal mood and affect. Her behavior is normal. Judgment and thought content normal.    Lab Results  Component Value Date   WBC 9.6 10/03/2010   HGB 12.4 10/03/2010   HCT 35.7* 10/03/2010   PLT 282.0 10/03/2010   CHOL 292* 07/09/2010   TRIG 45.0 07/09/2010   HDL 80.80 07/09/2010   LDLDIRECT 196.2 07/09/2010   ALT 32 08/01/2009   AST 29 08/01/2009   NA 138 07/03/2011   K 4.5 07/03/2011   CL 103 07/03/2011   CREATININE 0.9 07/03/2011   BUN 28* 07/03/2011   CO2 26 07/03/2011   TSH 2.36 10/03/2010         Assessment & Plan:

## 2011-07-22 NOTE — Patient Instructions (Signed)
Take Amlodipine at night Start "Silver sneakers"

## 2011-09-08 ENCOUNTER — Other Ambulatory Visit: Payer: Self-pay | Admitting: *Deleted

## 2011-09-08 MED ORDER — MEMANTINE HCL 10 MG PO TABS: 10.0000 mg | ORAL_TABLET | Freq: Two times a day (BID) | ORAL | Status: DC

## 2011-09-16 ENCOUNTER — Encounter: Payer: Self-pay | Admitting: Internal Medicine

## 2011-09-16 ENCOUNTER — Ambulatory Visit: Payer: Medicare Other | Admitting: Internal Medicine

## 2011-09-16 ENCOUNTER — Ambulatory Visit (INDEPENDENT_AMBULATORY_CARE_PROVIDER_SITE_OTHER): Payer: Medicare Other | Admitting: Internal Medicine

## 2011-09-16 ENCOUNTER — Other Ambulatory Visit (INDEPENDENT_AMBULATORY_CARE_PROVIDER_SITE_OTHER): Payer: Medicare Other

## 2011-09-16 ENCOUNTER — Other Ambulatory Visit: Payer: Self-pay | Admitting: Internal Medicine

## 2011-09-16 VITALS — BP 150/70 | HR 72 | Temp 98.1°F | Resp 16 | Wt 139.0 lb

## 2011-09-16 DIAGNOSIS — R55 Syncope and collapse: Secondary | ICD-10-CM

## 2011-09-16 DIAGNOSIS — M545 Low back pain: Secondary | ICD-10-CM

## 2011-09-16 DIAGNOSIS — E871 Hypo-osmolality and hyponatremia: Secondary | ICD-10-CM

## 2011-09-16 DIAGNOSIS — Z23 Encounter for immunization: Secondary | ICD-10-CM

## 2011-09-16 DIAGNOSIS — E039 Hypothyroidism, unspecified: Secondary | ICD-10-CM

## 2011-09-16 DIAGNOSIS — M1990 Unspecified osteoarthritis, unspecified site: Secondary | ICD-10-CM

## 2011-09-16 DIAGNOSIS — I10 Essential (primary) hypertension: Secondary | ICD-10-CM

## 2011-09-16 LAB — URINALYSIS, ROUTINE W REFLEX MICROSCOPIC
Bilirubin Urine: NEGATIVE
Hgb urine dipstick: NEGATIVE
Nitrite: NEGATIVE
Total Protein, Urine: NEGATIVE
Urine Glucose: NEGATIVE

## 2011-09-16 LAB — CBC WITH DIFFERENTIAL/PLATELET
Basophils Relative: 0.9 % (ref 0.0–3.0)
Eosinophils Absolute: 0.3 10*3/uL (ref 0.0–0.7)
MCHC: 33.9 g/dL (ref 30.0–36.0)
MCV: 90.5 fl (ref 78.0–100.0)
Monocytes Absolute: 0.5 10*3/uL (ref 0.1–1.0)
Neutrophils Relative %: 62.9 % (ref 43.0–77.0)
WBC: 5.6 10*3/uL (ref 4.5–10.5)

## 2011-09-16 LAB — BASIC METABOLIC PANEL
Calcium: 9.4 mg/dL (ref 8.4–10.5)
GFR: 78.72 mL/min (ref >60.00)
Potassium: 4.5 mEq/L (ref 3.5–5.1)
Sodium: 142 mEq/L (ref 135–145)

## 2011-09-16 MED ORDER — LEVOTHYROXINE SODIUM 88 MCG PO TABS: 88.0000 ug | ORAL_TABLET | Freq: Every day | ORAL | Status: DC

## 2011-09-16 MED ORDER — VITAMIN D3 25 MCG (1000 UT) PO CAPS: 1.0000 | ORAL_CAPSULE | Freq: Every day | ORAL | Status: AC

## 2011-09-16 MED ORDER — AMLODIPINE BESYLATE 5 MG PO TABS: 5.0000 mg | ORAL_TABLET | Freq: Every day | ORAL | Status: DC

## 2011-09-16 MED ORDER — MELOXICAM 7.5 MG PO TABS: 7.5000 mg | ORAL_TABLET | Freq: Every day | ORAL | Status: DC

## 2011-09-16 MED ORDER — MEMANTINE HCL 10 MG PO TABS: 10.0000 mg | ORAL_TABLET | Freq: Two times a day (BID) | ORAL | Status: DC

## 2011-09-16 NOTE — Progress Notes (Signed)
  Subjective:    Patient ID: Lisa [name], female    DOB: July 02, 1928, 77 y.o.   MRN: [id number]  HPI  The patient presents for a follow-up of  chronic hypertension, chronic OA, memory loss controlled with medicines    Review of Systems  Constitutional: Negative for chills, activity change, appetite change, fatigue and unexpected weight change.  HENT: Negative for congestion, mouth sores and sinus pressure.   Eyes: Negative for visual disturbance.  Respiratory: Negative for cough and chest tightness.   Gastrointestinal: Negative for nausea and abdominal pain.  Genitourinary: Negative for frequency, difficulty urinating and vaginal pain.  Musculoskeletal: Negative for back pain and gait problem.  Skin: Negative for pallor and rash.  Neurological: Positive for syncope (remote). Negative for dizziness, tremors, weakness, numbness and headaches.  Psychiatric/Behavioral: Negative for confusion and sleep disturbance.       Objective:   Physical Exam  Constitutional: She appears well-developed and well-nourished. No distress.  HENT:  Head: Normocephalic.  Right Ear: External ear normal.  Left Ear: External ear normal.  Nose: Nose normal.  Mouth/Throat: Oropharynx is clear and moist.  Eyes: Conjunctivae are normal. Pupils are equal, round, and reactive to light. Right eye exhibits no discharge. Left eye exhibits no discharge.       Eyelids less red  Neck: Normal range of motion. Neck supple. No JVD present. No tracheal deviation present. No thyromegaly present.  Cardiovascular: Normal rate, regular rhythm and normal heart sounds.   Pulmonary/Chest: No stridor. No respiratory distress. She has no wheezes.  Abdominal: Soft. Bowel sounds are normal. She exhibits no distension and no mass. There is no tenderness. There is no rebound and no guarding.  Musculoskeletal: She exhibits no edema and no tenderness.  Lymphadenopathy:    She has no cervical adenopathy.  Neurological: She displays  normal reflexes. No cranial nerve deficit. She exhibits normal muscle tone. Coordination normal.  Skin: No rash noted. No erythema.  Psychiatric: She has a normal mood and affect. Her behavior is normal. Thought content normal.          Assessment & Plan:

## 2011-09-16 NOTE — Assessment & Plan Note (Signed)
Continue with current prescription therapy as reflected on the Med list.  

## 2011-09-16 NOTE — Assessment & Plan Note (Signed)
Will recheck labs 

## 2011-09-16 NOTE — Assessment & Plan Note (Signed)
No relapse 

## 2011-10-22 ENCOUNTER — Other Ambulatory Visit: Payer: Self-pay | Admitting: Internal Medicine

## 2012-01-08 ENCOUNTER — Telehealth: Payer: Self-pay | Admitting: *Deleted

## 2012-01-08 NOTE — Telephone Encounter (Signed)
Caller left vm req rx for pravastatin bid #[phone] be sent to [facility] in [place]. Med is not active on list., ok to fill?

## 2012-01-09 NOTE — Telephone Encounter (Signed)
Left mess for patient to call back.  Need to verify strength/dose.

## 2012-01-09 NOTE — Telephone Encounter (Signed)
No answer..... See below.

## 2012-01-09 NOTE — Telephone Encounter (Signed)
OK. Thx

## 2012-01-13 DIAGNOSIS — H3530 Unspecified macular degeneration: Secondary | ICD-10-CM | POA: Diagnosis not present

## 2012-01-13 DIAGNOSIS — H43819 Vitreous degeneration, unspecified eye: Secondary | ICD-10-CM | POA: Diagnosis not present

## 2012-01-15 NOTE — Telephone Encounter (Signed)
Spoke to [employer] at [facility] in [place]. He states pt last got Pravastatin 20mg  1 daily by Dr Dia Crawford on 01/05/12 in [place].

## 2012-01-19 MED ORDER — PRAVASTATIN SODIUM 20 MG PO TABS: 20.0000 mg | ORAL_TABLET | Freq: Every day | ORAL | Status: DC

## 2012-01-19 NOTE — Telephone Encounter (Signed)
20 mg qd is correct Thx

## 2012-01-19 NOTE — Telephone Encounter (Signed)
Addended by: Janeal Holmes on: 01/19/2012 01:15 PM   Modules accepted: Orders

## 2012-01-19 NOTE — Telephone Encounter (Signed)
Dr. Posey Rea- Does pt need to be taking Pravastatin 20 mg bid? See below/please advise

## 2012-01-20 ENCOUNTER — Other Ambulatory Visit: Payer: Self-pay | Admitting: *Deleted

## 2012-01-20 MED ORDER — PRAVASTATIN SODIUM 20 MG PO TABS: 20.0000 mg | ORAL_TABLET | Freq: Every day | ORAL | Status: DC

## 2012-02-16 DIAGNOSIS — H353190 Nonexudative age-related macular degeneration, unspecified eye, stage unspecified: Secondary | ICD-10-CM | POA: Diagnosis not present

## 2012-02-16 DIAGNOSIS — H43819 Vitreous degeneration, unspecified eye: Secondary | ICD-10-CM | POA: Diagnosis not present

## 2012-02-24 DIAGNOSIS — B351 Tinea unguium: Secondary | ICD-10-CM | POA: Diagnosis not present

## 2012-04-19 ENCOUNTER — Ambulatory Visit (INDEPENDENT_AMBULATORY_CARE_PROVIDER_SITE_OTHER)
Admission: RE | Admit: 2012-04-19 | Discharge: 2012-04-19 | Disposition: A | Discharge status: Home / Self Care | Payer: Medicare Other | Source: Ambulatory Visit | Attending: Family Medicine | Admitting: Family Medicine

## 2012-04-19 ENCOUNTER — Ambulatory Visit: Payer: Medicare Other | Admitting: Endocrinology

## 2012-04-19 ENCOUNTER — Ambulatory Visit (INDEPENDENT_AMBULATORY_CARE_PROVIDER_SITE_OTHER): Payer: Medicare Other | Admitting: Family Medicine

## 2012-04-19 ENCOUNTER — Encounter: Payer: Self-pay | Admitting: Family Medicine

## 2012-04-19 ENCOUNTER — Telehealth: Payer: Self-pay

## 2012-04-19 VITALS — BP 130/72 | HR 84 | Temp 97.7°F | Ht 66.0 in | Wt 139.0 lb

## 2012-04-19 DIAGNOSIS — M19049 Primary osteoarthritis, unspecified hand: Secondary | ICD-10-CM | POA: Diagnosis not present

## 2012-04-19 DIAGNOSIS — S52501A Unspecified fracture of the lower end of right radius, initial encounter for closed fracture: Secondary | ICD-10-CM

## 2012-04-19 DIAGNOSIS — S52599A Other fractures of lower end of unspecified radius, initial encounter for closed fracture: Secondary | ICD-10-CM | POA: Diagnosis not present

## 2012-04-19 DIAGNOSIS — M25539 Pain in unspecified wrist: Secondary | ICD-10-CM | POA: Diagnosis not present

## 2012-04-19 DIAGNOSIS — M25531 Pain in right wrist: Secondary | ICD-10-CM

## 2012-04-19 DIAGNOSIS — M79609 Pain in unspecified limb: Secondary | ICD-10-CM

## 2012-04-19 DIAGNOSIS — S63259A Unspecified dislocation of unspecified finger, initial encounter: Secondary | ICD-10-CM | POA: Diagnosis not present

## 2012-04-19 DIAGNOSIS — S63509A Unspecified sprain of unspecified wrist, initial encounter: Secondary | ICD-10-CM | POA: Diagnosis not present

## 2012-04-19 DIAGNOSIS — W19XXXA Unspecified fall, initial encounter: Secondary | ICD-10-CM

## 2012-04-19 DIAGNOSIS — M79641 Pain in right hand: Secondary | ICD-10-CM

## 2012-04-19 MED ORDER — HYDROCODONE-ACETAMINOPHEN 5-500 MG PO TABS: 1.0000 | ORAL_TABLET | Freq: Four times a day (QID) | ORAL | Status: AC | PRN

## 2012-04-19 NOTE — Patient Instructions (Signed)
REFERRAL: GO THE THE FRONT ROOM AT THE ENTRANCE OF OUR CLINIC, NEAR CHECK IN. ASK FOR MARION. SHE WILL HELP YOU SET UP YOUR REFERRAL. DATE: TIME:  

## 2012-04-19 NOTE — Telephone Encounter (Signed)
Pt walked in injury to rt wrist and hand with pain (pain level 8) and swelling; pt slipped off curb on 04/18/12. Pt cannot drive to [facility] office and Dr Patsy Lager will see pt.

## 2012-04-19 NOTE — Progress Notes (Signed)
Patient Name: Lisa [name] Date of Birth: 1928-10-21 Medical Record Number: [id number] Gender: female Date of Encounter: 04/19/2012  History of Present Illness:  Lisa [name] is a 78 y.o. very pleasant female patient who presents with the following:  Pleasant patient who I am asked to see on an emergent basis in the office, for an urgent evaluation of wrist pain, status post fall that occurred one day prior. The patient and her husband were driving home from [place], and the patient stumbled forward and fell on her outstretched hand. Now she is having significant pain in the wrist, swelling, and diffuse bruising. She is having significant pain with motion at the wrist and hand. No numbness and tingling distally. She has never had a prior operative intervention or fracture in the affected hand, which is her right.  Orthopedic history is significant for left total hip arthroplasty, subsequent dislocation of her prosthetic joint, with complication and operative fixation. She has also had some spine surgery. All operative interventions have been done in [place] in the past.  Patient Active Problem List  Diagnoses  . NEOPLASM OF UNCERTAIN BEHAVIOR OF SKIN  . HYPOTHYROIDISM  . HYPERLIPIDEMIA  . OTHER NONTHROMBOCYTOPENIC PURPURAS  . HYPERTENSION  . PERIPHERAL VASCULAR DISEASE  . OSTEOARTHRITIS  . LOW BACK PAIN  . MYALGIA  . DIARRHEA  . Syncopal episodes  . Hyponatremia  . Alzheimer disease  . Fatigue   Past Medical History  Diagnosis Date  . HYPOTHYROIDISM 11/23/2007  . HYPERLIPIDEMIA 11/23/2007  . HYPERTENSION 11/23/2007  . LOW BACK PAIN 11/23/2007  . OSTEOARTHRITIS 11/23/2007  . PERIPHERAL VASCULAR DISEASE 07/11/2008  . Macular degeneration   . Glaucoma    Past Surgical History  Procedure Date  . Total knee arthroplasty   . Lumbar fusion    History  Substance Use Topics  . Smoking status: Never Smoker   . Smokeless tobacco: Not on file  . Alcohol Use: No   Family History    Problem Relation Age of Onset  . Hypertension Other   . Hyperlipidemia Mother   . Diabetes Father    Allergies  Allergen Reactions  . Aricept (Donepezil Hydrochloride)     syncope  . Levothyroxine Sodium     Medication list has been reviewed and updated.  Review of Systems:  GEN: No fevers, chills. Nontoxic. Primarily MSK c/o today. MSK: Detailed in the HPI GI: tolerating PO intake without difficulty Neuro: No numbness, parasthesias, or tingling associated. Otherwise, the pertinent positives and negatives are listed above and in the HPI, otherwise a full review of systems has been reviewed and is negative unless noted positive.    Physical Examination: Filed Vitals:   04/19/12 0949  BP: 130/72  Pulse: 84  Temp: 97.7 F (36.5 C)  TempSrc: Oral  Height: 5\' 6"  (1.676 m)  Weight: 139 lb (63.05 kg)  SpO2: 98%    Body mass index is 22.44 kg/(m^2).   GEN: WDWN, NAD, Non-toxic, Alert & Oriented x 3 HEENT: Atraumatic, Normocephalic.  Ears and Nose: No external deformity. NEURO: Normal gait.  PSYCH: Normally interactive. Conversant. Not depressed or anxious appearing.  Calm demeanor.   MSK: Diffuse swelling and bruising at her distal radius, significant swelling and bruising around the first and second metacarpals and around her wrists. Significant tenderness to palpation at the distal radius. Nontender at the distal colon. Nontender proximally along the radius and ulna. Range of motion at the elbow is full.  Nontender throughout all phalanges. Nontender along all metacarpals. Nontender at all  PIP, DIP, and MCP joints. Carpal bones and true wrist joint is tender to palpation. Given suspicion for occult fx, excessive manipulation is not attempted.  Objective:  Dg Wrist Complete Right  04/19/2012  *RADIOLOGY REPORT*  Clinical Data: Suspicious for distal radial fracture  RIGHT WRIST - COMPLETE 3+ VIEW  Comparison: Right hand same day  Findings: Four views of the right wrist  submitted.  There is mild impacted fracture of distal radial metaphysis.  Again noted degenerative changes first carpal metacarpal joint.  Again noted partial subluxation of the second metacarpal phalangeal joint.  IMPRESSION: Mild impacted fracture of distal radial metaphysis.  Original Report Authenticated By: Natasha Mead, M.D.   Dg Hand Complete Right  04/19/2012  *RADIOLOGY REPORT*  Clinical Data: Fall  RIGHT HAND - COMPLETE 3+ VIEW  Comparison: None.  Findings: There is near complete anterior subluxation of the base of the proximal phalanx of the index finger with respect to the head of the second metacarpal.  The dorsal aspect of the base of the proximal phalanx is perched upon the palmar aspect of the head. There is also slight subluxation of the base of the third proximal phalanx with respect to the head of the third metacarpal.  No acute fracture and no dislocation. Degenerative change involving the second metacarpal phalangeal joint.  Severe degenerative changes at the base of the first metacarpal.  Central erosions involving the DIP joints of the long finger and ring finger are noted.  IMPRESSION: Near complete dislocation of the second metacarpal phalangeal joint.  Partial subluxation of the third metacarpal phalangeal joint.  Chronic changes.  Original Report Authenticated By: Donavan Burnet, M.D.     Assessment and Plan:  1. Distal radius fracture, right  Ambulatory referral to Orthopedic Surgery  2. Wrist pain, right  DG Wrist Complete Right, DG Wrist Navic Only Right, DG Hand Complete Right, Ambulatory referral to Orthopedic Surgery  3. Hand pain, right  DG Wrist Complete Right, DG Wrist Navic Only Right, DG Hand Complete Right, Ambulatory referral to Orthopedic Surgery   Urgent evaluation, the patient has sustained a nondisplaced minimally impacted right distal radius fracture. The patient was placed in a fiberglass sugar tong splint without diffulty and placed in a sling.  Multiple  findings in the hand, consistent with degenerative changes. Nonacute.  With the patient's age and risk, we have made her an orthopedics appointment for definitive management at 2:15 PM today at [facility].  Orders Today: Orders Placed This Encounter  Procedures  . DG Wrist Complete Right    Standing Status: Future     Number of Occurrences: 1     Standing Expiration Date: 06/19/2013    Order Specific Question:  Preferred imaging location?    Answer:  [facility]    Order Specific Question:  Reason for exam:    Answer:  FOOSH, strong suspicion distal radius fracture  . DG Hand Complete Right    Standing Status: Future     Number of Occurrences: 1     Standing Expiration Date: 06/19/2013    Order Specific Question:  Reason for exam:    Answer:  FOOSH, distal radius fx strong suspicion, cannot exclude other    Order Specific Question:  Preferred imaging location?    Answer:  [facility]  . Ambulatory referral to Orthopedic Surgery    Referral Priority:  Routine    Referral Type:  Surgical    Referral Reason:  Specialty Services Required    Requested Specialty:  Orthopedic Surgery    Number of Visits Requested:  1    Medications Today: Meds ordered this encounter  Medications  . HYDROcodone-acetaminophen (VICODIN) 5-500 MG per tablet    Sig: Take 1 tablet by mouth every 6 (six) hours as needed for pain.    Dispense:  20 tablet    Refill:  0

## 2012-04-26 DIAGNOSIS — M25539 Pain in unspecified wrist: Secondary | ICD-10-CM | POA: Diagnosis not present

## 2012-04-26 DIAGNOSIS — M19049 Primary osteoarthritis, unspecified hand: Secondary | ICD-10-CM | POA: Diagnosis not present

## 2012-04-26 DIAGNOSIS — S63509A Unspecified sprain of unspecified wrist, initial encounter: Secondary | ICD-10-CM | POA: Diagnosis not present

## 2012-05-20 DIAGNOSIS — S63509A Unspecified sprain of unspecified wrist, initial encounter: Secondary | ICD-10-CM | POA: Diagnosis not present

## 2012-05-20 DIAGNOSIS — Z96659 Presence of unspecified artificial knee joint: Secondary | ICD-10-CM | POA: Diagnosis not present

## 2012-05-20 DIAGNOSIS — M25569 Pain in unspecified knee: Secondary | ICD-10-CM | POA: Diagnosis not present

## 2012-06-25 ENCOUNTER — Other Ambulatory Visit (INDEPENDENT_AMBULATORY_CARE_PROVIDER_SITE_OTHER): Payer: Medicare Other

## 2012-06-25 ENCOUNTER — Ambulatory Visit (INDEPENDENT_AMBULATORY_CARE_PROVIDER_SITE_OTHER): Payer: Medicare Other | Admitting: Internal Medicine

## 2012-06-25 VITALS — BP 170/90 | HR 73 | Temp 97.0°F | Resp 16 | Wt 154.2 lb

## 2012-06-25 DIAGNOSIS — F0280 Dementia in other diseases classified elsewhere without behavioral disturbance: Secondary | ICD-10-CM | POA: Diagnosis not present

## 2012-06-25 DIAGNOSIS — G309 Alzheimer's disease, unspecified: Secondary | ICD-10-CM | POA: Diagnosis not present

## 2012-06-25 DIAGNOSIS — E039 Hypothyroidism, unspecified: Secondary | ICD-10-CM

## 2012-06-25 DIAGNOSIS — R5383 Other fatigue: Secondary | ICD-10-CM | POA: Diagnosis not present

## 2012-06-25 DIAGNOSIS — I10 Essential (primary) hypertension: Secondary | ICD-10-CM

## 2012-06-25 DIAGNOSIS — E785 Hyperlipidemia, unspecified: Secondary | ICD-10-CM

## 2012-06-25 DIAGNOSIS — R5381 Other malaise: Secondary | ICD-10-CM

## 2012-06-25 LAB — BASIC METABOLIC PANEL
BUN: 28 mg/dL — ABNORMAL HIGH (ref 6–23)
Creatinine, Ser: 1 mg/dL (ref 0.4–1.2)
GFR: 59.81 mL/min — ABNORMAL LOW (ref >60.00)
Potassium: 4.7 mEq/L (ref 3.5–5.1)

## 2012-06-25 MED ORDER — LOSARTAN POTASSIUM 100 MG PO TABS: 100.0000 mg | ORAL_TABLET | Freq: Every day | ORAL | Status: DC

## 2012-06-25 NOTE — Progress Notes (Signed)
  Subjective:    Patient ID: Lisa [name], female    DOB: [birth year], 78 y.o.   MRN: [id number]  HPI  C/o elevated BP - new -- SBP 180-190/90 x 1 wk. C/o fatigue  The patient presents for a follow-up of  chronic hypertension, chronic OA, memory loss controlled with medicines  BP Readings from Last 3 Encounters:  06/25/12 170/90  04/19/12 130/72  09/16/11 150/70   Wt Readings from Last 3 Encounters:  06/25/12 154 lb 4 oz (69.967 kg)  04/19/12 139 lb (63.05 kg)  09/16/11 139 lb (63.05 kg)       Review of Systems  Constitutional: Negative for chills, activity change, appetite change, fatigue and unexpected weight change.  HENT: Negative for congestion, mouth sores and sinus pressure.   Eyes: Negative for visual disturbance.  Respiratory: Negative for cough and chest tightness.   Gastrointestinal: Negative for nausea and abdominal pain.  Genitourinary: Negative for frequency, difficulty urinating and vaginal pain.  Musculoskeletal: Negative for back pain and gait problem.  Skin: Negative for pallor and rash.  Neurological: Positive for syncope (remote). Negative for dizziness, tremors, weakness, numbness and headaches.  Psychiatric/Behavioral: Negative for confusion and disturbed wake/sleep cycle.       Objective:   Physical Exam  Constitutional: She appears well-developed and well-nourished. No distress.  HENT:  Head: Normocephalic.  Right Ear: External ear normal.  Left Ear: External ear normal.  Nose: Nose normal.  Mouth/Throat: Oropharynx is clear and moist.  Eyes: Conjunctivae are normal. Pupils are equal, round, and reactive to light. Right eye exhibits no discharge. Left eye exhibits no discharge.       Eyelids less red  Neck: Normal range of motion. Neck supple. No JVD present. No tracheal deviation present. No thyromegaly present.  Cardiovascular: Normal rate, regular rhythm and normal heart sounds.   Pulmonary/Chest: No stridor. No respiratory distress. She has  no wheezes.  Abdominal: Soft. Bowel sounds are normal. She exhibits no distension and no mass. There is no tenderness. There is no rebound and no guarding.  Musculoskeletal: She exhibits no edema and no tenderness.  Lymphadenopathy:    She has no cervical adenopathy.  Neurological: She displays normal reflexes. No cranial nerve deficit. She exhibits normal muscle tone. Coordination normal.  Skin: No rash noted. No erythema.  Psychiatric: She has a normal mood and affect. Her behavior is normal. Thought content normal.     Lab Results  Component Value Date   WBC 5.6 09/16/2011   HGB 12.4 09/16/2011   HCT 36.4 09/16/2011   PLT 255.0 09/16/2011   GLUCOSE 84 09/16/2011   CHOL 292* 07/09/2010   TRIG 45.0 07/09/2010   HDL 80.80 07/09/2010   LDLDIRECT 196.2 07/09/2010   LDLCALC 127* 11/23/2007   ALT 32 08/01/2009   AST 29 08/01/2009   NA 142 09/16/2011   K 4.5 09/16/2011   CL 103 09/16/2011   CREATININE 0.8 09/16/2011   BUN 28* 09/16/2011   CO2 30 09/16/2011   TSH 1.79 09/16/2011        Assessment & Plan:

## 2012-06-25 NOTE — Assessment & Plan Note (Addendum)
Chronic  7/13 - worse due to wt gain Reduce Meloxicam or stop it Added Losartan

## 2012-06-26 ENCOUNTER — Encounter: Payer: Self-pay | Admitting: Internal Medicine

## 2012-06-26 NOTE — Assessment & Plan Note (Signed)
Continue with current prescription therapy as reflected on the Med list.  

## 2012-06-26 NOTE — Assessment & Plan Note (Signed)
No change Check labs

## 2012-06-29 ENCOUNTER — Ambulatory Visit: Payer: Medicare Other | Admitting: Internal Medicine

## 2012-07-27 ENCOUNTER — Encounter: Payer: Self-pay | Admitting: Internal Medicine

## 2012-07-27 ENCOUNTER — Ambulatory Visit (INDEPENDENT_AMBULATORY_CARE_PROVIDER_SITE_OTHER): Payer: Medicare Other | Admitting: Internal Medicine

## 2012-07-27 VITALS — BP 142/82 | HR 70 | Temp 97.7°F | Wt 158.0 lb

## 2012-07-27 DIAGNOSIS — E039 Hypothyroidism, unspecified: Secondary | ICD-10-CM

## 2012-07-27 DIAGNOSIS — R55 Syncope and collapse: Secondary | ICD-10-CM

## 2012-07-27 DIAGNOSIS — R5381 Other malaise: Secondary | ICD-10-CM | POA: Diagnosis not present

## 2012-07-27 DIAGNOSIS — R5383 Other fatigue: Secondary | ICD-10-CM

## 2012-07-27 DIAGNOSIS — I10 Essential (primary) hypertension: Secondary | ICD-10-CM

## 2012-07-27 NOTE — Assessment & Plan Note (Signed)
Discussed.

## 2012-07-27 NOTE — Assessment & Plan Note (Signed)
Better Loose wt Continue with current prescription therapy as reflected on the Med list.

## 2012-07-27 NOTE — Assessment & Plan Note (Signed)
No relapse 

## 2012-07-27 NOTE — Progress Notes (Signed)
   Subjective:    Patient ID: Lisa [name], female    DOB: 19-Mar-1928, 78 y.o.   MRN: [id number]  Hypertension Pertinent negatives include no headaches.    F/u elevated BP -SBP 130-140/80 at home. C/o fatigue - not better.  The patient presents for a follow-up of  chronic hypertension, chronic OA, memory loss controlled with medicines  BP Readings from Last 3 Encounters:  07/27/12 142/82  06/25/12 170/90  04/19/12 130/72   Wt Readings from Last 3 Encounters:  07/27/12 158 lb (71.668 kg)  06/25/12 154 lb 4 oz (69.967 kg)  04/19/12 139 lb (63.05 kg)       Review of Systems  Constitutional: Negative for chills, activity change, appetite change, fatigue and unexpected weight change.  HENT: Negative for congestion, mouth sores and sinus pressure.   Eyes: Negative for visual disturbance.  Respiratory: Negative for cough and chest tightness.   Gastrointestinal: Negative for nausea and abdominal pain.  Genitourinary: Negative for frequency, difficulty urinating and vaginal pain.  Musculoskeletal: Negative for back pain and gait problem.  Skin: Negative for pallor and rash.  Neurological: Positive for syncope (remote). Negative for dizziness, tremors, weakness, numbness and headaches.  Psychiatric/Behavioral: Negative for confusion and disturbed wake/sleep cycle.       Objective:   Physical Exam  Constitutional: She appears well-developed and well-nourished. No distress.  HENT:  Head: Normocephalic.  Right Ear: External ear normal.  Left Ear: External ear normal.  Nose: Nose normal.  Mouth/Throat: Oropharynx is clear and moist.  Eyes: Conjunctivae are normal. Pupils are equal, round, and reactive to light. Right eye exhibits no discharge. Left eye exhibits no discharge.       Eyelids less red  Neck: Normal range of motion. Neck supple. No JVD present. No tracheal deviation present. No thyromegaly present.  Cardiovascular: Normal rate, regular rhythm and normal heart sounds.    Pulmonary/Chest: No stridor. No respiratory distress. She has no wheezes.  Abdominal: Soft. Bowel sounds are normal. She exhibits no distension and no mass. There is no tenderness. There is no rebound and no guarding.  Musculoskeletal: She exhibits no edema and no tenderness.  Lymphadenopathy:    She has no cervical adenopathy.  Neurological: She displays normal reflexes. No cranial nerve deficit. She exhibits normal muscle tone. Coordination normal.  Skin: No rash noted. No erythema.  Psychiatric: She has a normal mood and affect. Her behavior is normal. Thought content normal.     Lab Results  Component Value Date   WBC 5.6 09/16/2011   HGB 12.4 09/16/2011   HCT 36.4 09/16/2011   PLT 255.0 09/16/2011   GLUCOSE 102* 06/25/2012   CHOL 292* 07/09/2010   TRIG 45.0 07/09/2010   HDL 80.80 07/09/2010   LDLDIRECT 196.2 07/09/2010   LDLCALC 127* 11/23/2007   ALT 32 08/01/2009   AST 29 08/01/2009   NA 139 06/25/2012   K 4.7 06/25/2012   CL 101 06/25/2012   CREATININE 1.0 06/25/2012   BUN 28* 06/25/2012   CO2 29 06/25/2012   TSH 1.79 09/16/2011        Assessment & Plan:

## 2012-07-27 NOTE — Assessment & Plan Note (Signed)
Continue with current prescription therapy as reflected on the Med list.  

## 2012-08-24 DIAGNOSIS — H43819 Vitreous degeneration, unspecified eye: Secondary | ICD-10-CM | POA: Diagnosis not present

## 2012-08-24 DIAGNOSIS — H4011X0 Primary open-angle glaucoma, stage unspecified: Secondary | ICD-10-CM | POA: Diagnosis not present

## 2012-08-24 DIAGNOSIS — H353190 Nonexudative age-related macular degeneration, unspecified eye, stage unspecified: Secondary | ICD-10-CM | POA: Diagnosis not present

## 2012-09-09 ENCOUNTER — Other Ambulatory Visit: Payer: Self-pay | Admitting: Cardiology

## 2012-09-09 DIAGNOSIS — I6529 Occlusion and stenosis of unspecified carotid artery: Secondary | ICD-10-CM

## 2012-09-16 ENCOUNTER — Other Ambulatory Visit: Payer: Self-pay | Admitting: *Deleted

## 2012-09-16 ENCOUNTER — Ambulatory Visit (INDEPENDENT_AMBULATORY_CARE_PROVIDER_SITE_OTHER): Payer: Medicare Other | Admitting: *Deleted

## 2012-09-16 DIAGNOSIS — Z23 Encounter for immunization: Secondary | ICD-10-CM | POA: Diagnosis not present

## 2012-09-16 MED ORDER — AMLODIPINE BESYLATE 5 MG PO TABS: 5.0000 mg | ORAL_TABLET | Freq: Every day | ORAL | Status: DC

## 2012-09-16 MED ORDER — LOSARTAN POTASSIUM 100 MG PO TABS: 100.0000 mg | ORAL_TABLET | Freq: Every day | ORAL | Status: DC

## 2012-09-16 MED ORDER — PRAVASTATIN SODIUM 20 MG PO TABS: 20.0000 mg | ORAL_TABLET | Freq: Every day | ORAL | Status: DC

## 2012-09-16 MED ORDER — MELOXICAM 7.5 MG PO TABS: 7.5000 mg | ORAL_TABLET | Freq: Every day | ORAL | Status: DC

## 2012-09-16 MED ORDER — LEVOTHYROXINE SODIUM 88 MCG PO TABS: 88.0000 ug | ORAL_TABLET | Freq: Every day | ORAL | Status: DC

## 2012-10-26 DIAGNOSIS — H3530 Unspecified macular degeneration: Secondary | ICD-10-CM | POA: Diagnosis not present

## 2012-11-02 DIAGNOSIS — B351 Tinea unguium: Secondary | ICD-10-CM | POA: Diagnosis not present

## 2013-01-04 DIAGNOSIS — B351 Tinea unguium: Secondary | ICD-10-CM | POA: Diagnosis not present

## 2013-01-14 ENCOUNTER — Other Ambulatory Visit: Payer: Self-pay | Admitting: Internal Medicine

## 2013-02-08 DIAGNOSIS — H4011X0 Primary open-angle glaucoma, stage unspecified: Secondary | ICD-10-CM | POA: Diagnosis not present

## 2013-02-14 DIAGNOSIS — H3581 Retinal edema: Secondary | ICD-10-CM | POA: Diagnosis not present

## 2013-02-14 DIAGNOSIS — H353290 Exudative age-related macular degeneration, unspecified eye, stage unspecified: Secondary | ICD-10-CM | POA: Diagnosis not present

## 2013-03-07 DIAGNOSIS — H353290 Exudative age-related macular degeneration, unspecified eye, stage unspecified: Secondary | ICD-10-CM | POA: Diagnosis not present

## 2013-03-14 DIAGNOSIS — H353290 Exudative age-related macular degeneration, unspecified eye, stage unspecified: Secondary | ICD-10-CM | POA: Diagnosis not present

## 2013-03-15 DIAGNOSIS — B351 Tinea unguium: Secondary | ICD-10-CM | POA: Diagnosis not present

## 2013-04-11 DIAGNOSIS — H353290 Exudative age-related macular degeneration, unspecified eye, stage unspecified: Secondary | ICD-10-CM | POA: Diagnosis not present

## 2013-05-10 DIAGNOSIS — H353190 Nonexudative age-related macular degeneration, unspecified eye, stage unspecified: Secondary | ICD-10-CM | POA: Diagnosis not present

## 2013-05-10 DIAGNOSIS — H353290 Exudative age-related macular degeneration, unspecified eye, stage unspecified: Secondary | ICD-10-CM | POA: Diagnosis not present

## 2013-05-10 DIAGNOSIS — H35059 Retinal neovascularization, unspecified, unspecified eye: Secondary | ICD-10-CM | POA: Diagnosis not present

## 2013-05-10 DIAGNOSIS — H43819 Vitreous degeneration, unspecified eye: Secondary | ICD-10-CM | POA: Diagnosis not present

## 2013-05-17 ENCOUNTER — Other Ambulatory Visit: Payer: Self-pay | Admitting: Internal Medicine

## 2013-05-20 DIAGNOSIS — H4011X0 Primary open-angle glaucoma, stage unspecified: Secondary | ICD-10-CM | POA: Diagnosis not present

## 2013-05-20 DIAGNOSIS — H353290 Exudative age-related macular degeneration, unspecified eye, stage unspecified: Secondary | ICD-10-CM | POA: Diagnosis not present

## 2013-05-20 DIAGNOSIS — H35059 Retinal neovascularization, unspecified, unspecified eye: Secondary | ICD-10-CM | POA: Diagnosis not present

## 2013-05-20 DIAGNOSIS — H353190 Nonexudative age-related macular degeneration, unspecified eye, stage unspecified: Secondary | ICD-10-CM | POA: Diagnosis not present

## 2013-07-06 DIAGNOSIS — H353290 Exudative age-related macular degeneration, unspecified eye, stage unspecified: Secondary | ICD-10-CM | POA: Diagnosis not present

## 2013-07-06 DIAGNOSIS — H35059 Retinal neovascularization, unspecified, unspecified eye: Secondary | ICD-10-CM | POA: Diagnosis not present

## 2013-07-06 DIAGNOSIS — H353190 Nonexudative age-related macular degeneration, unspecified eye, stage unspecified: Secondary | ICD-10-CM | POA: Diagnosis not present

## 2013-07-06 DIAGNOSIS — H43819 Vitreous degeneration, unspecified eye: Secondary | ICD-10-CM | POA: Diagnosis not present

## 2013-08-16 DIAGNOSIS — H353290 Exudative age-related macular degeneration, unspecified eye, stage unspecified: Secondary | ICD-10-CM | POA: Diagnosis not present

## 2013-08-16 DIAGNOSIS — H4011X0 Primary open-angle glaucoma, stage unspecified: Secondary | ICD-10-CM | POA: Diagnosis not present

## 2013-08-16 DIAGNOSIS — H353190 Nonexudative age-related macular degeneration, unspecified eye, stage unspecified: Secondary | ICD-10-CM | POA: Diagnosis not present

## 2013-08-16 DIAGNOSIS — H35059 Retinal neovascularization, unspecified, unspecified eye: Secondary | ICD-10-CM | POA: Diagnosis not present

## 2013-09-06 ENCOUNTER — Telehealth: Payer: Self-pay | Admitting: Internal Medicine

## 2013-09-06 DIAGNOSIS — I10 Essential (primary) hypertension: Secondary | ICD-10-CM

## 2013-09-06 DIAGNOSIS — E785 Hyperlipidemia, unspecified: Secondary | ICD-10-CM

## 2013-09-06 DIAGNOSIS — E039 Hypothyroidism, unspecified: Secondary | ICD-10-CM

## 2013-09-06 NOTE — Telephone Encounter (Signed)
Pt request lab work prior to her appt on 09/13/13. Pt request the lab work to be order at [facility] at [employer] because its very close to the house. Please call pt if this is ok.

## 2013-09-08 NOTE — Telephone Encounter (Signed)
Lab schedule in for [facility] location 09/08/13 at 8:35 am. Please put in order for pt.

## 2013-09-08 NOTE — Telephone Encounter (Signed)
Ok lipids, liver, BMET, CBC, TSH, UA Thx

## 2013-09-09 ENCOUNTER — Other Ambulatory Visit (INDEPENDENT_AMBULATORY_CARE_PROVIDER_SITE_OTHER): Payer: Medicare Other

## 2013-09-09 DIAGNOSIS — I10 Essential (primary) hypertension: Secondary | ICD-10-CM

## 2013-09-09 DIAGNOSIS — E039 Hypothyroidism, unspecified: Secondary | ICD-10-CM

## 2013-09-09 DIAGNOSIS — E785 Hyperlipidemia, unspecified: Secondary | ICD-10-CM

## 2013-09-09 LAB — BASIC METABOLIC PANEL
BUN: 26 mg/dL — ABNORMAL HIGH (ref 6–23)
CO2: 28 mEq/L (ref 19–32)
Chloride: 102 mEq/L (ref 96–112)
Creatinine, Ser: 1 mg/dL (ref 0.4–1.2)
GFR: 57.53 mL/min — ABNORMAL LOW (ref >60.00)
Potassium: 4.2 mEq/L (ref 3.5–5.1)
Sodium: 138 mEq/L (ref 135–145)

## 2013-09-09 LAB — CBC WITH DIFFERENTIAL/PLATELET
Basophils Absolute: 0 10*3/uL (ref 0.0–0.1)
Basophils Relative: 0.5 % (ref 0.0–3.0)
Eosinophils Relative: 4.5 % (ref 0.0–5.0)
Hemoglobin: 13.9 g/dL (ref 12.0–15.0)
Lymphocytes Relative: 30.2 % (ref 12.0–46.0)
MCHC: 34.1 g/dL (ref 30.0–36.0)
Monocytes Relative: 8.5 % (ref 3.0–12.0)
Neutro Abs: 3.6 10*3/uL (ref 1.4–7.7)
Neutrophils Relative %: 56.3 % (ref 43.0–77.0)
RBC: 4.5 Mil/uL (ref 3.87–5.11)
WBC: 6.4 10*3/uL (ref 4.5–10.5)

## 2013-09-09 LAB — URINALYSIS, ROUTINE W REFLEX MICROSCOPIC
Bilirubin Urine: NEGATIVE
Nitrite: POSITIVE
Specific Gravity, Urine: 1.01 (ref 1.000–1.030)
Total Protein, Urine: NEGATIVE
Urine Glucose: NEGATIVE

## 2013-09-09 LAB — LIPID PANEL
Cholesterol: 276 mg/dL — ABNORMAL HIGH (ref 0–200)
VLDL: 14.6 mg/dL (ref 0.0–40.0)

## 2013-09-09 LAB — LDL CHOLESTEROL, DIRECT: Direct LDL: 187.3 mg/dL

## 2013-09-09 LAB — HEPATIC FUNCTION PANEL
ALT: 23 U/L (ref 0–35)
AST: 22 U/L (ref 0–37)
Bilirubin, Direct: 0 mg/dL (ref 0.0–0.3)
Total Bilirubin: 0.7 mg/dL (ref 0.3–1.2)
Total Protein: 7.5 g/dL (ref 6.0–8.3)

## 2013-09-09 LAB — TSH: TSH: 8.98 u[IU]/mL — ABNORMAL HIGH (ref 0.35–5.50)

## 2013-09-09 NOTE — Telephone Encounter (Signed)
Labs entered.

## 2013-09-13 ENCOUNTER — Ambulatory Visit (INDEPENDENT_AMBULATORY_CARE_PROVIDER_SITE_OTHER): Payer: Medicare Other | Admitting: Internal Medicine

## 2013-09-13 ENCOUNTER — Encounter: Payer: Self-pay | Admitting: Internal Medicine

## 2013-09-13 VITALS — BP 148/90 | HR 88 | Temp 97.7°F | Resp 16 | Wt 166.0 lb

## 2013-09-13 DIAGNOSIS — F0280 Dementia in other diseases classified elsewhere without behavioral disturbance: Secondary | ICD-10-CM

## 2013-09-13 DIAGNOSIS — R5381 Other malaise: Secondary | ICD-10-CM

## 2013-09-13 DIAGNOSIS — E039 Hypothyroidism, unspecified: Secondary | ICD-10-CM

## 2013-09-13 DIAGNOSIS — R5383 Other fatigue: Secondary | ICD-10-CM

## 2013-09-13 DIAGNOSIS — I10 Essential (primary) hypertension: Secondary | ICD-10-CM | POA: Diagnosis not present

## 2013-09-13 DIAGNOSIS — Z23 Encounter for immunization: Secondary | ICD-10-CM

## 2013-09-13 DIAGNOSIS — R55 Syncope and collapse: Secondary | ICD-10-CM

## 2013-09-13 MED ORDER — MEMANTINE HCL 10 MG PO TABS: 10.0000 mg | ORAL_TABLET | Freq: Two times a day (BID) | ORAL | Status: DC

## 2013-09-13 MED ORDER — LEVOTHYROXINE SODIUM 100 MCG PO TABS: 100.0000 ug | ORAL_TABLET | Freq: Every day | ORAL | Status: DC

## 2013-09-13 MED ORDER — LEVOTHYROXINE SODIUM 88 MCG PO TABS: 88.0000 ug | ORAL_TABLET | Freq: Every day | ORAL | Status: DC

## 2013-09-13 MED ORDER — PRAVASTATIN SODIUM 20 MG PO TABS: 20.0000 mg | ORAL_TABLET | Freq: Every day | ORAL | Status: DC

## 2013-09-13 MED ORDER — LOSARTAN POTASSIUM 100 MG PO TABS: 100.0000 mg | ORAL_TABLET | Freq: Every day | ORAL | Status: DC

## 2013-09-13 MED ORDER — PHOSPHATIDYLSERINE-DHA-EPA 100-19.5-6.5 MG PO CAPS: 1.0000 | ORAL_CAPSULE | Freq: Every day | ORAL | Status: DC

## 2013-09-13 MED ORDER — AMLODIPINE BESYLATE 5 MG PO TABS: 5.0000 mg | ORAL_TABLET | Freq: Every day | ORAL | Status: DC

## 2013-09-13 MED ORDER — MELOXICAM 7.5 MG PO TABS: 7.5000 mg | ORAL_TABLET | Freq: Every day | ORAL | Status: DC

## 2013-09-13 MED ORDER — CIPROFLOXACIN HCL 250 MG PO TABS: 250.0000 mg | ORAL_TABLET | Freq: Two times a day (BID) | ORAL | Status: DC

## 2013-09-18 ENCOUNTER — Encounter: Payer: Self-pay | Admitting: Internal Medicine

## 2013-09-18 NOTE — Assessment & Plan Note (Signed)
No change 

## 2013-09-18 NOTE — Assessment & Plan Note (Signed)
Continue with current prescription therapy as reflected on the Med list.  

## 2013-09-18 NOTE — Progress Notes (Signed)
   Subjective:     Hypertension This is a chronic problem. Pertinent negatives include no headaches.    F/u elevated BP -SBP 130-140/80 at home. C/o fatigue - not better.  The patient presents for a follow-up of  chronic hypertension, chronic OA, memory loss controlled with medicines  BP Readings from Last 3 Encounters:  09/13/13 148/90  07/27/12 142/82  06/25/12 170/90   Wt Readings from Last 3 Encounters:  09/13/13 166 lb (75.297 kg)  07/27/12 158 lb (71.668 kg)  06/25/12 154 lb 4 oz (69.967 kg)       Review of Systems  Constitutional: Negative for chills, activity change, appetite change, fatigue and unexpected weight change.  HENT: Negative for congestion, mouth sores and sinus pressure.   Eyes: Negative for visual disturbance.  Respiratory: Negative for cough and chest tightness.   Gastrointestinal: Negative for nausea and abdominal pain.  Genitourinary: Negative for frequency, difficulty urinating and vaginal pain.  Musculoskeletal: Negative for back pain and gait problem.  Skin: Negative for pallor and rash.  Neurological: Positive for syncope (remote). Negative for dizziness, tremors, weakness, numbness and headaches.  Psychiatric/Behavioral: Negative for confusion and sleep disturbance.       Objective:   Physical Exam  Constitutional: She appears well-developed and well-nourished. No distress.  HENT:  Head: Normocephalic.  Right Ear: External ear normal.  Left Ear: External ear normal.  Nose: Nose normal.  Mouth/Throat: Oropharynx is clear and moist.  Eyes: Conjunctivae are normal. Pupils are equal, round, and reactive to light. Right eye exhibits no discharge. Left eye exhibits no discharge.  Eyelids less red  Neck: Normal range of motion. Neck supple. No JVD present. No tracheal deviation present. No thyromegaly present.  Cardiovascular: Normal rate, regular rhythm and normal heart sounds.   Pulmonary/Chest: No stridor. No respiratory distress. She has  no wheezes.  Abdominal: Soft. Bowel sounds are normal. She exhibits no distension and no mass. There is no tenderness. There is no rebound and no guarding.  Musculoskeletal: She exhibits no edema and no tenderness.  Lymphadenopathy:    She has no cervical adenopathy.  Neurological: She displays normal reflexes. No cranial nerve deficit. She exhibits normal muscle tone. Coordination normal.  Skin: No rash noted. No erythema.  Psychiatric: She has a normal mood and affect. Her behavior is normal. Thought content normal.     Lab Results  Component Value Date   WBC 6.4 09/09/2013   HGB 13.9 09/09/2013   HCT 40.7 09/09/2013   PLT 294.0 09/09/2013   GLUCOSE 95 09/09/2013   CHOL 276* 09/09/2013   TRIG 73.0 09/09/2013   HDL 70.10 09/09/2013   LDLDIRECT 187.3 09/09/2013   LDLCALC 127* 11/23/2007   ALT 23 09/09/2013   AST 22 09/09/2013   NA 138 09/09/2013   K 4.2 09/09/2013   CL 102 09/09/2013   CREATININE 1.0 09/09/2013   BUN 26* 09/09/2013   CO2 28 09/09/2013   TSH 8.98* 09/09/2013        Assessment & Plan:

## 2013-09-18 NOTE — Assessment & Plan Note (Signed)
2014 moderate Discussed Continue with current prescription therapy as reflected on the Med list.

## 2013-09-18 NOTE — Assessment & Plan Note (Signed)
No relapse 

## 2013-09-23 DIAGNOSIS — H353290 Exudative age-related macular degeneration, unspecified eye, stage unspecified: Secondary | ICD-10-CM | POA: Diagnosis not present

## 2013-09-23 DIAGNOSIS — H353190 Nonexudative age-related macular degeneration, unspecified eye, stage unspecified: Secondary | ICD-10-CM | POA: Diagnosis not present

## 2013-09-23 DIAGNOSIS — H35059 Retinal neovascularization, unspecified, unspecified eye: Secondary | ICD-10-CM | POA: Diagnosis not present

## 2013-09-23 DIAGNOSIS — H43819 Vitreous degeneration, unspecified eye: Secondary | ICD-10-CM | POA: Diagnosis not present

## 2013-11-04 DIAGNOSIS — H353190 Nonexudative age-related macular degeneration, unspecified eye, stage unspecified: Secondary | ICD-10-CM | POA: Diagnosis not present

## 2013-11-04 DIAGNOSIS — H353290 Exudative age-related macular degeneration, unspecified eye, stage unspecified: Secondary | ICD-10-CM | POA: Diagnosis not present

## 2013-11-22 DIAGNOSIS — B351 Tinea unguium: Secondary | ICD-10-CM | POA: Diagnosis not present

## 2013-12-13 DIAGNOSIS — H4011X0 Primary open-angle glaucoma, stage unspecified: Secondary | ICD-10-CM | POA: Diagnosis not present

## 2013-12-16 DIAGNOSIS — H353290 Exudative age-related macular degeneration, unspecified eye, stage unspecified: Secondary | ICD-10-CM | POA: Diagnosis not present

## 2014-01-20 DIAGNOSIS — H353290 Exudative age-related macular degeneration, unspecified eye, stage unspecified: Secondary | ICD-10-CM | POA: Diagnosis not present

## 2014-01-31 DIAGNOSIS — B351 Tinea unguium: Secondary | ICD-10-CM | POA: Diagnosis not present

## 2014-02-17 DIAGNOSIS — H43819 Vitreous degeneration, unspecified eye: Secondary | ICD-10-CM | POA: Diagnosis not present

## 2014-02-17 DIAGNOSIS — H353290 Exudative age-related macular degeneration, unspecified eye, stage unspecified: Secondary | ICD-10-CM | POA: Diagnosis not present

## 2014-03-24 DIAGNOSIS — H353290 Exudative age-related macular degeneration, unspecified eye, stage unspecified: Secondary | ICD-10-CM | POA: Diagnosis not present

## 2014-04-06 DIAGNOSIS — E039 Hypothyroidism, unspecified: Secondary | ICD-10-CM | POA: Diagnosis not present

## 2014-04-06 DIAGNOSIS — R05 Cough: Secondary | ICD-10-CM | POA: Diagnosis not present

## 2014-04-06 DIAGNOSIS — J069 Acute upper respiratory infection, unspecified: Secondary | ICD-10-CM | POA: Diagnosis not present

## 2014-04-06 DIAGNOSIS — R030 Elevated blood-pressure reading, without diagnosis of hypertension: Secondary | ICD-10-CM | POA: Diagnosis not present

## 2014-04-06 DIAGNOSIS — R059 Cough, unspecified: Secondary | ICD-10-CM | POA: Diagnosis not present

## 2014-04-25 DIAGNOSIS — H35059 Retinal neovascularization, unspecified, unspecified eye: Secondary | ICD-10-CM | POA: Diagnosis not present

## 2014-04-25 DIAGNOSIS — H4011X0 Primary open-angle glaucoma, stage unspecified: Secondary | ICD-10-CM | POA: Diagnosis not present

## 2014-04-25 DIAGNOSIS — H353290 Exudative age-related macular degeneration, unspecified eye, stage unspecified: Secondary | ICD-10-CM | POA: Diagnosis not present

## 2014-04-25 DIAGNOSIS — H353190 Nonexudative age-related macular degeneration, unspecified eye, stage unspecified: Secondary | ICD-10-CM | POA: Diagnosis not present

## 2014-05-29 ENCOUNTER — Other Ambulatory Visit: Payer: Self-pay | Admitting: Internal Medicine

## 2014-05-30 DIAGNOSIS — H353290 Exudative age-related macular degeneration, unspecified eye, stage unspecified: Secondary | ICD-10-CM | POA: Diagnosis not present

## 2014-05-30 DIAGNOSIS — H35059 Retinal neovascularization, unspecified, unspecified eye: Secondary | ICD-10-CM | POA: Diagnosis not present

## 2014-07-04 DIAGNOSIS — H353190 Nonexudative age-related macular degeneration, unspecified eye, stage unspecified: Secondary | ICD-10-CM | POA: Diagnosis not present

## 2014-07-04 DIAGNOSIS — H353290 Exudative age-related macular degeneration, unspecified eye, stage unspecified: Secondary | ICD-10-CM | POA: Diagnosis not present

## 2014-07-04 DIAGNOSIS — H35059 Retinal neovascularization, unspecified, unspecified eye: Secondary | ICD-10-CM | POA: Diagnosis not present

## 2014-08-14 ENCOUNTER — Telehealth: Payer: Self-pay | Admitting: Internal Medicine

## 2014-08-14 DIAGNOSIS — E039 Hypothyroidism, unspecified: Secondary | ICD-10-CM

## 2014-08-14 DIAGNOSIS — Z0000 Encounter for general adult medical examination without abnormal findings: Secondary | ICD-10-CM

## 2014-08-14 DIAGNOSIS — E785 Hyperlipidemia, unspecified: Secondary | ICD-10-CM

## 2014-08-14 NOTE — Telephone Encounter (Signed)
Pt states he needs [place] to place lab orders for him so he may go get his labs at [facility] prior to his appt on 08/25/14.

## 2014-08-15 DIAGNOSIS — H353290 Exudative age-related macular degeneration, unspecified eye, stage unspecified: Secondary | ICD-10-CM | POA: Diagnosis not present

## 2014-08-15 DIAGNOSIS — H35059 Retinal neovascularization, unspecified, unspecified eye: Secondary | ICD-10-CM | POA: Diagnosis not present

## 2014-08-18 NOTE — Telephone Encounter (Signed)
They are requesting that we call them to confirm once the orders have been entered.

## 2014-08-18 NOTE — Telephone Encounter (Signed)
Patient's husband called again regarding lab orders for [facility]. They both have appointments on 08/25/14. Can you place orders please?

## 2014-08-22 NOTE — Telephone Encounter (Signed)
MD ok labs to be entered. Pt husnabd has been notified...[name]

## 2014-08-23 ENCOUNTER — Other Ambulatory Visit (INDEPENDENT_AMBULATORY_CARE_PROVIDER_SITE_OTHER): Payer: Medicare Other

## 2014-08-23 DIAGNOSIS — E039 Hypothyroidism, unspecified: Secondary | ICD-10-CM

## 2014-08-23 DIAGNOSIS — Z0000 Encounter for general adult medical examination without abnormal findings: Secondary | ICD-10-CM

## 2014-08-23 DIAGNOSIS — E785 Hyperlipidemia, unspecified: Secondary | ICD-10-CM | POA: Diagnosis not present

## 2014-08-23 LAB — HEPATIC FUNCTION PANEL
ALT: 19 U/L (ref 0–35)
AST: 21 U/L (ref 0–37)
Albumin: 3.9 g/dL (ref 3.5–5.2)
Alkaline Phosphatase: 75 U/L (ref 39–117)
BILIRUBIN DIRECT: 0 mg/dL (ref 0.0–0.3)
Total Bilirubin: 0.4 mg/dL (ref 0.2–1.2)
Total Protein: 7.5 g/dL (ref 6.0–8.3)

## 2014-08-23 LAB — BASIC METABOLIC PANEL
BUN: 25 mg/dL — AB (ref 6–23)
CALCIUM: 9.6 mg/dL (ref 8.4–10.5)
CO2: 27 mEq/L (ref 19–32)
CREATININE: 1.1 mg/dL (ref 0.4–1.2)
Chloride: 102 mEq/L (ref 96–112)
GFR: 51.31 mL/min — ABNORMAL LOW (ref >60.00)
Glucose, Bld: 94 mg/dL (ref 70–99)
POTASSIUM: 3.8 meq/L (ref 3.5–5.1)
Sodium: 137 mEq/L (ref 135–145)

## 2014-08-23 LAB — URINALYSIS, ROUTINE W REFLEX MICROSCOPIC
BILIRUBIN URINE: NEGATIVE
HGB URINE DIPSTICK: NEGATIVE
Ketones, ur: NEGATIVE
NITRITE: NEGATIVE
RBC / HPF: NONE SEEN (ref 0–?)
Specific Gravity, Urine: 1.02 (ref 1.000–1.030)
Total Protein, Urine: 30 — AB
UROBILINOGEN UA: 0.2 (ref 0.0–1.0)
Urine Glucose: NEGATIVE
pH: 6 (ref 5.0–8.0)

## 2014-08-23 LAB — CBC WITH DIFFERENTIAL/PLATELET
Basophils Absolute: 0 10*3/uL (ref 0.0–0.1)
Basophils Relative: 0.4 % (ref 0.0–3.0)
EOS ABS: 0.2 10*3/uL (ref 0.0–0.7)
Eosinophils Relative: 3.5 % (ref 0.0–5.0)
HCT: 42.2 % (ref 36.0–46.0)
Hemoglobin: 14.2 g/dL (ref 12.0–15.0)
Lymphocytes Relative: 36.5 % (ref 12.0–46.0)
Lymphs Abs: 2.4 10*3/uL (ref 0.7–4.0)
MCHC: 33.7 g/dL (ref 30.0–36.0)
MCV: 91 fl (ref 78.0–100.0)
Monocytes Absolute: 0.6 10*3/uL (ref 0.1–1.0)
Monocytes Relative: 8.7 % (ref 3.0–12.0)
NEUTROS ABS: 3.4 10*3/uL (ref 1.4–7.7)
NEUTROS PCT: 50.9 % (ref 43.0–77.0)
Platelets: 305 10*3/uL (ref 150.0–400.0)
RBC: 4.64 Mil/uL (ref 3.87–5.11)
RDW: 14.2 % (ref 11.5–15.5)
WBC: 6.6 10*3/uL (ref 4.0–10.5)

## 2014-08-23 LAB — TSH: TSH: 7.97 u[IU]/mL — AB (ref 0.35–4.50)

## 2014-08-23 LAB — LIPID PANEL
CHOLESTEROL: 303 mg/dL — AB (ref 0–200)
HDL: 68.8 mg/dL (ref >39.00)
LDL CALC: 220 mg/dL — AB (ref 0–99)
NonHDL: 234.2
Total CHOL/HDL Ratio: 4
Triglycerides: 72 mg/dL (ref 0.0–149.0)
VLDL: 14.4 mg/dL (ref 0.0–40.0)

## 2014-08-23 NOTE — Addendum Note (Signed)
Addended by: Marchia Bond on: 08/23/2014 10:44 AM   Modules accepted: Orders

## 2014-08-25 ENCOUNTER — Encounter: Payer: Self-pay | Admitting: Internal Medicine

## 2014-08-25 ENCOUNTER — Ambulatory Visit (INDEPENDENT_AMBULATORY_CARE_PROVIDER_SITE_OTHER): Payer: Medicare Other | Admitting: Internal Medicine

## 2014-08-25 VITALS — BP 160/80 | HR 69 | Temp 97.7°F | Wt 161.0 lb

## 2014-08-25 DIAGNOSIS — Z23 Encounter for immunization: Secondary | ICD-10-CM

## 2014-08-25 DIAGNOSIS — E039 Hypothyroidism, unspecified: Secondary | ICD-10-CM | POA: Diagnosis not present

## 2014-08-25 DIAGNOSIS — F0280 Dementia in other diseases classified elsewhere without behavioral disturbance: Secondary | ICD-10-CM | POA: Diagnosis not present

## 2014-08-25 DIAGNOSIS — G309 Alzheimer's disease, unspecified: Secondary | ICD-10-CM | POA: Diagnosis not present

## 2014-08-25 DIAGNOSIS — Z0000 Encounter for general adult medical examination without abnormal findings: Secondary | ICD-10-CM

## 2014-08-25 MED ORDER — AMLODIPINE BESYLATE 5 MG PO TABS: 5.0000 mg | ORAL_TABLET | Freq: Every day | ORAL | Status: DC

## 2014-08-25 MED ORDER — LOSARTAN POTASSIUM 100 MG PO TABS: 100.0000 mg | ORAL_TABLET | Freq: Every day | ORAL | Status: DC

## 2014-08-25 MED ORDER — MEMANTINE HCL ER 28 MG PO CP24: 1.0000 | ORAL_CAPSULE | Freq: Every day | ORAL | Status: DC

## 2014-08-25 MED ORDER — LEVOTHYROXINE SODIUM 100 MCG PO TABS: 100.0000 ug | ORAL_TABLET | Freq: Every day | ORAL | Status: DC

## 2014-08-25 NOTE — Assessment & Plan Note (Signed)
Risks associated with treatment noncompliance were discussed. Compliance was encouraged.  TSH in 2 mo

## 2014-08-25 NOTE — Progress Notes (Signed)
   Subjective:   The patient is here for a wellness exam.   The patient has been doing well overall without major physical or psychological issues going on lately.    Hypertension This is a chronic problem. Pertinent negatives include no headaches.    F/u elevated BP -SBP 130-140/80 at home. C/o fatigue - not better.  The patient presents for a follow-up of  chronic hypertension, chronic OA, memory loss - worse  BP Readings from Last 3 Encounters:  08/25/14 160/80  09/13/13 148/90  07/27/12 142/82   Wt Readings from Last 3 Encounters:  08/25/14 161 lb (73.029 kg)  09/13/13 166 lb (75.297 kg)  07/27/12 158 lb (71.668 kg)       Review of Systems  Constitutional: Negative for chills, activity change, appetite change, fatigue and unexpected weight change.  HENT: Negative for congestion, mouth sores and sinus pressure.   Eyes: Negative for visual disturbance.  Respiratory: Negative for cough and chest tightness.   Gastrointestinal: Negative for nausea and abdominal pain.  Genitourinary: Negative for frequency, difficulty urinating and vaginal pain.  Musculoskeletal: Negative for back pain and gait problem.  Skin: Negative for pallor and rash.  Neurological: Positive for syncope (remote). Negative for dizziness, tremors, weakness, numbness and headaches.  Psychiatric/Behavioral: Negative for confusion and sleep disturbance.       Objective:   Physical Exam  Constitutional: She appears well-developed and well-nourished. No distress.  HENT:  Head: Normocephalic.  Right Ear: External ear normal.  Left Ear: External ear normal.  Nose: Nose normal.  Mouth/Throat: Oropharynx is clear and moist.  Eyes: Conjunctivae are normal. Pupils are equal, round, and reactive to light. Right eye exhibits no discharge. Left eye exhibits no discharge.  Eyelids less red  Neck: Normal range of motion. Neck supple. No JVD present. No tracheal deviation present. No thyromegaly present.   Cardiovascular: Normal rate, regular rhythm and normal heart sounds.   Pulmonary/Chest: No stridor. No respiratory distress. She has no wheezes.  Abdominal: Soft. Bowel sounds are normal. She exhibits no distension and no mass. There is no tenderness. There is no rebound and no guarding.  Musculoskeletal: She exhibits no edema and no tenderness.  Lymphadenopathy:    She has no cervical adenopathy.  Neurological: She displays normal reflexes. No cranial nerve deficit. She exhibits normal muscle tone. Coordination normal.  Skin: No rash noted. No erythema.  Psychiatric: She has a normal mood and affect. Her behavior is normal.     Lab Results  Component Value Date   WBC 6.6 08/23/2014   HGB 14.2 08/23/2014   HCT 42.2 08/23/2014   PLT 305.0 08/23/2014   GLUCOSE 94 08/23/2014   CHOL 303* 08/23/2014   TRIG 72.0 08/23/2014   HDL 68.80 08/23/2014   LDLDIRECT 187.3 09/09/2013   LDLCALC 220* 08/23/2014   ALT 19 08/23/2014   AST 21 08/23/2014   NA 137 08/23/2014   K 3.8 08/23/2014   CL 102 08/23/2014   CREATININE 1.1 08/23/2014   BUN 25* 08/23/2014   CO2 27 08/23/2014   TSH 7.97* 08/23/2014        Assessment & Plan:

## 2014-08-25 NOTE — Assessment & Plan Note (Signed)
Risks associated with treatment noncompliance were discussed. Compliance was encouraged. Changed to Namenda XR 28 mg/d

## 2014-08-25 NOTE — Progress Notes (Signed)
Pre visit review using our clinic review tool, if applicable. No additional management support is needed unless otherwise documented below in the visit note. 

## 2014-08-25 NOTE — Patient Instructions (Signed)
Preventive Care for Adults A healthy lifestyle and preventive care can promote health and wellness. Preventive health guidelines for women include the following key practices.  A routine yearly physical is a good way to check with your health care provider about your health and preventive screening. It is a chance to share any concerns and updates on your health and to receive a thorough exam.  Visit your dentist for a routine exam and preventive care every 6 months. Brush your teeth twice a day and floss once a day. Good oral hygiene prevents tooth decay and gum disease.  The frequency of eye exams is based on your age, health, family medical history, use of contact lenses, and other factors. Follow your health care provider's recommendations for frequency of eye exams.  Eat a healthy diet. Foods like vegetables, fruits, whole grains, low-fat dairy products, and lean protein foods contain the nutrients you need without too many calories. Decrease your intake of foods high in solid fats, added sugars, and salt. Eat the right amount of calories for you.Get information about a proper diet from your health care provider, if necessary.  Regular physical exercise is one of the most important things you can do for your health. Most adults should get at least 150 minutes of moderate-intensity exercise (any activity that increases your heart rate and causes you to sweat) each week. In addition, most adults need muscle-strengthening exercises on 2 or more days a week.  Maintain a healthy weight. The body mass index (BMI) is a screening tool to identify possible weight problems. It provides an estimate of body fat based on height and weight. Your health care provider can find your BMI and can help you achieve or maintain a healthy weight.For adults 20 years and older:  A BMI below 18.5 is considered underweight.  A BMI of 18.5 to 24.9 is normal.  A BMI of 25 to 29.9 is considered overweight.  A BMI of  30 and above is considered obese.  Maintain normal blood lipids and cholesterol levels by exercising and minimizing your intake of saturated fat. Eat a balanced diet with plenty of fruit and vegetables. Blood tests for lipids and cholesterol should begin at age 76 and be repeated every 5 years. If your lipid or cholesterol levels are high, you are over 50, or you are at high risk for heart disease, you may need your cholesterol levels checked more frequently.Ongoing high lipid and cholesterol levels should be treated with medicines if diet and exercise are not working.  If you smoke, find out from your health care provider how to quit. If you do not use tobacco, do not start.  Lung cancer screening is recommended for adults aged 22-80 years who are at high risk for developing lung cancer because of a history of smoking. A yearly low-dose CT scan of the lungs is recommended for people who have at least a 30-pack-year history of smoking and are a current smoker or have quit within the past 15 years. A pack year of smoking is smoking an average of 1 pack of cigarettes a day for 1 year (for example: 1 pack a day for 30 years or 2 packs a day for 15 years). Yearly screening should continue until the smoker has stopped smoking for at least 15 years. Yearly screening should be stopped for people who develop a health problem that would prevent them from having lung cancer treatment.  If you are pregnant, do not drink alcohol. If you are breastfeeding,  be very cautious about drinking alcohol. If you are not pregnant and choose to drink alcohol, do not have more than 1 drink per day. One drink is considered to be 12 ounces (355 mL) of beer, 5 ounces (148 mL) of wine, or 1.5 ounces (44 mL) of liquor.  Avoid use of street drugs. Do not share needles with anyone. Ask for help if you need support or instructions about stopping the use of drugs.  High blood pressure causes heart disease and increases the risk of  stroke. Your blood pressure should be checked at least every 1 to 2 years. Ongoing high blood pressure should be treated with medicines if weight loss and exercise do not work.  If you are 3-86 years old, ask your health care provider if you should take aspirin to prevent strokes.  Diabetes screening involves taking a blood sample to check your fasting blood sugar level. This should be done once every 3 years, after age 67, if you are within normal weight and without risk factors for diabetes. Testing should be considered at a younger age or be carried out more frequently if you are overweight and have at least 1 risk factor for diabetes.  Breast cancer screening is essential preventive care for women. You should practice "breast self-awareness." This means understanding the normal appearance and feel of your breasts and may include breast self-examination. Any changes detected, no matter how small, should be reported to a health care provider. Women in their 8s and 30s should have a clinical breast exam (CBE) by a health care provider as part of a regular health exam every 1 to 3 years. After age 70, women should have a CBE every year. Starting at age 25, women should consider having a mammogram (breast X-ray test) every year. Women who have a family history of breast cancer should talk to their health care provider about genetic screening. Women at a high risk of breast cancer should talk to their health care providers about having an MRI and a mammogram every year.  Breast cancer gene (BRCA)-related cancer risk assessment is recommended for women who have family members with BRCA-related cancers. BRCA-related cancers include breast, ovarian, tubal, and peritoneal cancers. Having family members with these cancers may be associated with an increased risk for harmful changes (mutations) in the breast cancer genes BRCA1 and BRCA2. Results of the assessment will determine the need for genetic counseling and  BRCA1 and BRCA2 testing.  Routine pelvic exams to screen for cancer are no longer recommended for nonpregnant women who are considered low risk for cancer of the pelvic organs (ovaries, uterus, and vagina) and who do not have symptoms. Ask your health care provider if a screening pelvic exam is right for you.  If you have had past treatment for cervical cancer or a condition that could lead to cancer, you need Pap tests and screening for cancer for at least 20 years after your treatment. If Pap tests have been discontinued, your risk factors (such as having a new sexual partner) need to be reassessed to determine if screening should be resumed. Some women have medical problems that increase the chance of getting cervical cancer. In these cases, your health care provider may recommend more frequent screening and Pap tests.  The HPV test is an additional test that may be used for cervical cancer screening. The HPV test looks for the virus that can cause the cell changes on the cervix. The cells collected during the Pap test can be  tested for HPV. The HPV test could be used to screen women aged 30 years and older, and should be used in women of any age who have unclear Pap test results. After the age of 30, women should have HPV testing at the same frequency as a Pap test.  Colorectal cancer can be detected and often prevented. Most routine colorectal cancer screening begins at the age of 50 years and continues through age 75 years. However, your health care provider may recommend screening at an earlier age if you have risk factors for colon cancer. On a yearly basis, your health care provider may provide home test kits to check for hidden blood in the stool. Use of a small camera at the end of a tube, to directly examine the colon (sigmoidoscopy or colonoscopy), can detect the earliest forms of colorectal cancer. Talk to your health care provider about this at age 50, when routine screening begins. Direct  exam of the colon should be repeated every 5-10 years through age 75 years, unless early forms of pre-cancerous polyps or small growths are found.  People who are at an increased risk for hepatitis B should be screened for this virus. You are considered at high risk for hepatitis B if:  You were born in a country where hepatitis B occurs often. Talk with your health care provider about which countries are considered high risk.  Your parents were born in a high-risk country and you have not received a shot to protect against hepatitis B (hepatitis B vaccine).  You have HIV or AIDS.  You use needles to inject street drugs.  You live with, or have sex with, someone who has hepatitis B.  You get hemodialysis treatment.  You take certain medicines for conditions like cancer, organ transplantation, and autoimmune conditions.  Hepatitis C blood testing is recommended for all people born from 1945 through 1965 and any individual with known risks for hepatitis C.  Practice safe sex. Use condoms and avoid high-risk sexual practices to reduce the spread of sexually transmitted infections (STIs). STIs include gonorrhea, chlamydia, syphilis, trichomonas, herpes, HPV, and human immunodeficiency virus (HIV). Herpes, HIV, and HPV are viral illnesses that have no cure. They can result in disability, cancer, and death.  You should be screened for sexually transmitted illnesses (STIs) including gonorrhea and chlamydia if:  You are sexually active and are younger than 24 years.  You are older than 24 years and your health care provider tells you that you are at risk for this type of infection.  Your sexual activity has changed since you were last screened and you are at an increased risk for chlamydia or gonorrhea. Ask your health care provider if you are at risk.  If you are at risk of being infected with HIV, it is recommended that you take a prescription medicine daily to prevent HIV infection. This is  called preexposure prophylaxis (PrEP). You are considered at risk if:  You are a heterosexual woman, are sexually active, and are at increased risk for HIV infection.  You take drugs by injection.  You are sexually active with a partner who has HIV.  Talk with your health care provider about whether you are at high risk of being infected with HIV. If you choose to begin PrEP, you should first be tested for HIV. You should then be tested every 3 months for as long as you are taking PrEP.  Osteoporosis is a disease in which the bones lose minerals and strength   with aging. This can result in serious bone fractures or breaks. The risk of osteoporosis can be identified using a bone density scan. Women ages 65 years and over and women at risk for fractures or osteoporosis should discuss screening with their health care providers. Ask your health care provider whether you should take a calcium supplement or vitamin D to reduce the rate of osteoporosis.  Menopause can be associated with physical symptoms and risks. Hormone replacement therapy is available to decrease symptoms and risks. You should talk to your health care provider about whether hormone replacement therapy is right for you.  Use sunscreen. Apply sunscreen liberally and repeatedly throughout the day. You should seek shade when your shadow is shorter than you. Protect yourself by wearing long sleeves, pants, a wide-brimmed hat, and sunglasses year round, whenever you are outdoors.  Once a month, do a whole body skin exam, using a mirror to look at the skin on your back. Tell your health care provider of new moles, moles that have irregular borders, moles that are larger than a pencil eraser, or moles that have changed in shape or color.  Stay current with required vaccines (immunizations).  Influenza vaccine. All adults should be immunized every year.  Tetanus, diphtheria, and acellular pertussis (Td, Tdap) vaccine. Pregnant women should  receive 1 dose of Tdap vaccine during each pregnancy. The dose should be obtained regardless of the length of time since the last dose. Immunization is preferred during the 27th-36th week of gestation. An adult who has not previously received Tdap or who does not know her vaccine status should receive 1 dose of Tdap. This initial dose should be followed by tetanus and diphtheria toxoids (Td) booster doses every 10 years. Adults with an unknown or incomplete history of completing a 3-dose immunization series with Td-containing vaccines should begin or complete a primary immunization series including a Tdap dose. Adults should receive a Td booster every 10 years.  Varicella vaccine. An adult without evidence of immunity to varicella should receive 2 doses or a second dose if she has previously received 1 dose. Pregnant females who do not have evidence of immunity should receive the first dose after pregnancy. This first dose should be obtained before leaving the health care facility. The second dose should be obtained 4-8 weeks after the first dose.  Human papillomavirus (HPV) vaccine. Females aged 13-26 years who have not received the vaccine previously should obtain the 3-dose series. The vaccine is not recommended for use in pregnant females. However, pregnancy testing is not needed before receiving a dose. If a female is found to be pregnant after receiving a dose, no treatment is needed. In that case, the remaining doses should be delayed until after the pregnancy. Immunization is recommended for any person with an immunocompromised condition through the age of 26 years if she did not get any or all doses earlier. During the 3-dose series, the second dose should be obtained 4-8 weeks after the first dose. The third dose should be obtained 24 weeks after the first dose and 16 weeks after the second dose.  Zoster vaccine. One dose is recommended for adults aged 60 years or older unless certain conditions are  present.  Measles, mumps, and rubella (MMR) vaccine. Adults born before 1957 generally are considered immune to measles and mumps. Adults born in 1957 or later should have 1 or more doses of MMR vaccine unless there is a contraindication to the vaccine or there is laboratory evidence of immunity to   each of the three diseases. A routine second dose of MMR vaccine should be obtained at least 28 days after the first dose for students attending postsecondary schools, health care workers, or international travelers. People who received inactivated measles vaccine or an unknown type of measles vaccine during 1963-1967 should receive 2 doses of MMR vaccine. People who received inactivated mumps vaccine or an unknown type of mumps vaccine before 1979 and are at high risk for mumps infection should consider immunization with 2 doses of MMR vaccine. For females of childbearing age, rubella immunity should be determined. If there is no evidence of immunity, females who are not pregnant should be vaccinated. If there is no evidence of immunity, females who are pregnant should delay immunization until after pregnancy. Unvaccinated health care workers born before 1957 who lack laboratory evidence of measles, mumps, or rubella immunity or laboratory confirmation of disease should consider measles and mumps immunization with 2 doses of MMR vaccine or rubella immunization with 1 dose of MMR vaccine.  Pneumococcal 13-valent conjugate (PCV13) vaccine. When indicated, a person who is uncertain of her immunization history and has no record of immunization should receive the PCV13 vaccine. An adult aged 19 years or older who has certain medical conditions and has not been previously immunized should receive 1 dose of PCV13 vaccine. This PCV13 should be followed with a dose of pneumococcal polysaccharide (PPSV23) vaccine. The PPSV23 vaccine dose should be obtained at least 8 weeks after the dose of PCV13 vaccine. An adult aged 19  years or older who has certain medical conditions and previously received 1 or more doses of PPSV23 vaccine should receive 1 dose of PCV13. The PCV13 vaccine dose should be obtained 1 or more years after the last PPSV23 vaccine dose.  Pneumococcal polysaccharide (PPSV23) vaccine. When PCV13 is also indicated, PCV13 should be obtained first. All adults aged 65 years and older should be immunized. An adult younger than age 65 years who has certain medical conditions should be immunized. Any person who resides in a nursing home or long-term care facility should be immunized. An adult smoker should be immunized. People with an immunocompromised condition and certain other conditions should receive both PCV13 and PPSV23 vaccines. People with human immunodeficiency virus (HIV) infection should be immunized as soon as possible after diagnosis. Immunization during chemotherapy or radiation therapy should be avoided. Routine use of PPSV23 vaccine is not recommended for American Indians, [place] Natives, or people younger than 65 years unless there are medical conditions that require PPSV23 vaccine. When indicated, people who have unknown immunization and have no record of immunization should receive PPSV23 vaccine. One-time revaccination 5 years after the first dose of PPSV23 is recommended for people aged 19-64 years who have chronic kidney failure, nephrotic syndrome, asplenia, or immunocompromised conditions. People who received 1-2 doses of PPSV23 before age 65 years should receive another dose of PPSV23 vaccine at age 65 years or later if at least 5 years have passed since the previous dose. Doses of PPSV23 are not needed for people immunized with PPSV23 at or after age 65 years.  Meningococcal vaccine. Adults with asplenia or persistent complement component deficiencies should receive 2 doses of quadrivalent meningococcal conjugate (MenACWY-D) vaccine. The doses should be obtained at least 2 months apart.  Microbiologists working with certain meningococcal bacteria, military recruits, people at risk during an outbreak, and people who travel to or live in countries with a high rate of meningitis should be immunized. A first-year college student up through age   21 years who is living in a residence hall should receive a dose if she did not receive a dose on or after her 16th birthday. Adults who have certain high-risk conditions should receive one or more doses of vaccine.  Hepatitis A vaccine. Adults who wish to be protected from this disease, have certain high-risk conditions, work with hepatitis A-infected animals, work in hepatitis A research labs, or travel to or work in countries with a high rate of hepatitis A should be immunized. Adults who were previously unvaccinated and who anticipate close contact with an international adoptee during the first 60 days after arrival in the [place] from a country with a high rate of hepatitis A should be immunized.  Hepatitis B vaccine. Adults who wish to be protected from this disease, have certain high-risk conditions, may be exposed to blood or other infectious body fluids, are household contacts or sex partners of hepatitis B positive people, are clients or workers in certain care facilities, or travel to or work in countries with a high rate of hepatitis B should be immunized.  Haemophilus influenzae type b (Hib) vaccine. A previously unvaccinated person with asplenia or sickle cell disease or having a scheduled splenectomy should receive 1 dose of Hib vaccine. Regardless of previous immunization, a recipient of a hematopoietic stem cell transplant should receive a 3-dose series 6-12 months after her successful transplant. Hib vaccine is not recommended for adults with HIV infection. Preventive Services / Frequency Ages 64 to 68 years  Blood pressure check.** / Every 1 to 2 years.  Lipid and cholesterol check.** / Every 5 years beginning at age  22.  Clinical breast exam.** / Every 3 years for women in their 88s and 53s.  BRCA-related cancer risk assessment.** / For women who have family members with a BRCA-related cancer (breast, ovarian, tubal, or peritoneal cancers).  Pap test.** / Every 2 years from ages 90 through 51. Every 3 years starting at age 21 through age 56 or 3 with a history of 3 consecutive normal Pap tests.  HPV screening.** / Every 3 years from ages 24 through ages 1 to 46 with a history of 3 consecutive normal Pap tests.  Hepatitis C blood test.** / For any individual with known risks for hepatitis C.  Skin self-exam. / Monthly.  Influenza vaccine. / Every year.  Tetanus, diphtheria, and acellular pertussis (Tdap, Td) vaccine.** / Consult your health care provider. Pregnant women should receive 1 dose of Tdap vaccine during each pregnancy. 1 dose of Td every 10 years.  Varicella vaccine.** / Consult your health care provider. Pregnant females who do not have evidence of immunity should receive the first dose after pregnancy.  HPV vaccine. / 3 doses over 6 months, if 72 and younger. The vaccine is not recommended for use in pregnant females. However, pregnancy testing is not needed before receiving a dose.  Measles, mumps, rubella (MMR) vaccine.** / You need at least 1 dose of MMR if you were born in 1957 or later. You may also need a 2nd dose. For females of childbearing age, rubella immunity should be determined. If there is no evidence of immunity, females who are not pregnant should be vaccinated. If there is no evidence of immunity, females who are pregnant should delay immunization until after pregnancy.  Pneumococcal 13-valent conjugate (PCV13) vaccine.** / Consult your health care provider.  Pneumococcal polysaccharide (PPSV23) vaccine.** / 1 to 2 doses if you smoke cigarettes or if you have certain conditions.  Meningococcal vaccine.** /  1 dose if you are age 19 to 21 years and a first-year college  student living in a residence hall, or have one of several medical conditions, you need to get vaccinated against meningococcal disease. You may also need additional booster doses.  Hepatitis A vaccine.** / Consult your health care provider.  Hepatitis B vaccine.** / Consult your health care provider.  Haemophilus influenzae type b (Hib) vaccine.** / Consult your health care provider. Ages 40 to 64 years  Blood pressure check.** / Every 1 to 2 years.  Lipid and cholesterol check.** / Every 5 years beginning at age 20 years.  Lung cancer screening. / Every year if you are aged 55-80 years and have a 30-pack-year history of smoking and currently smoke or have quit within the past 15 years. Yearly screening is stopped once you have quit smoking for at least 15 years or develop a health problem that would prevent you from having lung cancer treatment.  Clinical breast exam.** / Every year after age 40 years.  BRCA-related cancer risk assessment.** / For women who have family members with a BRCA-related cancer (breast, ovarian, tubal, or peritoneal cancers).  Mammogram.** / Every year beginning at age 40 years and continuing for as long as you are in good health. Consult with your health care provider.  Pap test.** / Every 3 years starting at age 30 years through age 65 or 70 years with a history of 3 consecutive normal Pap tests.  HPV screening.** / Every 3 years from ages 30 years through ages 65 to 70 years with a history of 3 consecutive normal Pap tests.  Fecal occult blood test (FOBT) of stool. / Every year beginning at age 50 years and continuing until age 75 years. You may not need to do this test if you get a colonoscopy every 10 years.  Flexible sigmoidoscopy or colonoscopy.** / Every 5 years for a flexible sigmoidoscopy or every 10 years for a colonoscopy beginning at age 50 years and continuing until age 75 years.  Hepatitis C blood test.** / For all people born from 1945 through  1965 and any individual with known risks for hepatitis C.  Skin self-exam. / Monthly.  Influenza vaccine. / Every year.  Tetanus, diphtheria, and acellular pertussis (Tdap/Td) vaccine.** / Consult your health care provider. Pregnant women should receive 1 dose of Tdap vaccine during each pregnancy. 1 dose of Td every 10 years.  Varicella vaccine.** / Consult your health care provider. Pregnant females who do not have evidence of immunity should receive the first dose after pregnancy.  Zoster vaccine.** / 1 dose for adults aged 60 years or older.  Measles, mumps, rubella (MMR) vaccine.** / You need at least 1 dose of MMR if you were born in 1957 or later. You may also need a 2nd dose. For females of childbearing age, rubella immunity should be determined. If there is no evidence of immunity, females who are not pregnant should be vaccinated. If there is no evidence of immunity, females who are pregnant should delay immunization until after pregnancy.  Pneumococcal 13-valent conjugate (PCV13) vaccine.** / Consult your health care provider.  Pneumococcal polysaccharide (PPSV23) vaccine.** / 1 to 2 doses if you smoke cigarettes or if you have certain conditions.  Meningococcal vaccine.** / Consult your health care provider.  Hepatitis A vaccine.** / Consult your health care provider.  Hepatitis B vaccine.** / Consult your health care provider.  Haemophilus influenzae type b (Hib) vaccine.** / Consult your health care provider. Ages 65   years and over  Blood pressure check.** / Every 1 to 2 years.  Lipid and cholesterol check.** / Every 5 years beginning at age 22 years.  Lung cancer screening. / Every year if you are aged 73-80 years and have a 30-pack-year history of smoking and currently smoke or have quit within the past 15 years. Yearly screening is stopped once you have quit smoking for at least 15 years or develop a health problem that would prevent you from having lung cancer  treatment.  Clinical breast exam.** / Every year after age 4 years.  BRCA-related cancer risk assessment.** / For women who have family members with a BRCA-related cancer (breast, ovarian, tubal, or peritoneal cancers).  Mammogram.** / Every year beginning at age 40 years and continuing for as long as you are in good health. Consult with your health care provider.  Pap test.** / Every 3 years starting at age 9 years through age 34 or 91 years with 3 consecutive normal Pap tests. Testing can be stopped between 65 and 70 years with 3 consecutive normal Pap tests and no abnormal Pap or HPV tests in the past 10 years.  HPV screening.** / Every 3 years from ages 57 years through ages 64 or 45 years with a history of 3 consecutive normal Pap tests. Testing can be stopped between 65 and 70 years with 3 consecutive normal Pap tests and no abnormal Pap or HPV tests in the past 10 years.  Fecal occult blood test (FOBT) of stool. / Every year beginning at age 15 years and continuing until age 17 years. You may not need to do this test if you get a colonoscopy every 10 years.  Flexible sigmoidoscopy or colonoscopy.** / Every 5 years for a flexible sigmoidoscopy or every 10 years for a colonoscopy beginning at age 86 years and continuing until age 71 years.  Hepatitis C blood test.** / For all people born from [birth year] through 1963 and any individual with known risks for hepatitis C.  Osteoporosis screening.** / A one-time screening for women ages 83 years and over and women at risk for fractures or osteoporosis.  Skin self-exam. / Monthly.  Influenza vaccine. / Every year.  Tetanus, diphtheria, and acellular pertussis (Tdap/Td) vaccine.** / 1 dose of Td every 10 years.  Varicella vaccine.** / Consult your health care provider.  Zoster vaccine.** / 1 dose for adults aged 61 years or older.  Pneumococcal 13-valent conjugate (PCV13) vaccine.** / Consult your health care provider.  Pneumococcal  polysaccharide (PPSV23) vaccine.** / 1 dose for all adults aged 28 years and older.  Meningococcal vaccine.** / Consult your health care provider.  Hepatitis A vaccine.** / Consult your health care provider.  Hepatitis B vaccine.** / Consult your health care provider.  Haemophilus influenzae type b (Hib) vaccine.** / Consult your health care provider. ** Family history and personal history of risk and conditions may change your health care provider's recommendations. Document Released: 01/27/2002 Document Revised: 04/17/2014 Document Reviewed: 04/28/2011 [facility] Patient Information 2015 [place], [place]. This information is not intended to replace advice given to you by your health care provider. Make sure you discuss any questions you have with your health care provider.

## 2014-08-31 DIAGNOSIS — Z0000 Encounter for general adult medical examination without abnormal findings: Secondary | ICD-10-CM | POA: Insufficient documentation

## 2014-08-31 NOTE — Assessment & Plan Note (Signed)
Here for medicare wellness/physical  Diet: heart healthy  Physical activity: sedentary  Depression/mood screen: negative  Hearing: intact to whispered voice  Visual acuity: grossly normal, performs annual eye exam  ADLs: capable  Fall risk: moderate  Home safety: good  Cognitive evaluation: intact to orientation, naming, recall and repetition  EOL planning: adv directives, full code/ I agree  I have personally reviewed and have noted  1. The patient's medical and social history  2. Their use of alcohol, tobacco or illicit drugs  3. Their current medications and supplements  4. The patient's functional ability including ADL's, fall risks, home safety risks and hearing or visual impairment.  5. Diet and physical activities  6. Evidence for depression or mood disorders    Today patient counseled on age appropriate routine health concerns for screening and prevention, each reviewed and up to date or declined. Immunizations reviewed and up to date or declined. Labs ordered and reviewed. Risk factors for depression reviewed and negative. Hearing function and visual acuity are intact. ADLs screened and addressed as needed. Functional ability and level of safety reviewed and appropriate. Education, counseling and referrals performed based on assessed risks today. Patient provided with a copy of personalized plan for preventive services.

## 2014-09-11 DIAGNOSIS — L821 Other seborrheic keratosis: Secondary | ICD-10-CM | POA: Diagnosis not present

## 2014-09-11 DIAGNOSIS — D235 Other benign neoplasm of skin of trunk: Secondary | ICD-10-CM | POA: Diagnosis not present

## 2014-09-14 ENCOUNTER — Other Ambulatory Visit: Payer: Self-pay | Admitting: Internal Medicine

## 2014-10-06 DIAGNOSIS — H3532 Exudative age-related macular degeneration: Secondary | ICD-10-CM | POA: Diagnosis not present

## 2014-10-06 DIAGNOSIS — H43811 Vitreous degeneration, right eye: Secondary | ICD-10-CM | POA: Diagnosis not present

## 2014-10-17 DIAGNOSIS — B351 Tinea unguium: Secondary | ICD-10-CM | POA: Diagnosis not present

## 2014-10-31 DIAGNOSIS — M25551 Pain in right hip: Secondary | ICD-10-CM | POA: Diagnosis not present

## 2014-10-31 DIAGNOSIS — M25552 Pain in left hip: Secondary | ICD-10-CM | POA: Diagnosis not present

## 2014-10-31 DIAGNOSIS — M25561 Pain in right knee: Secondary | ICD-10-CM | POA: Diagnosis not present

## 2014-10-31 DIAGNOSIS — M25559 Pain in unspecified hip: Secondary | ICD-10-CM | POA: Diagnosis not present

## 2014-10-31 DIAGNOSIS — M25562 Pain in left knee: Secondary | ICD-10-CM | POA: Diagnosis not present

## 2014-11-02 DIAGNOSIS — R002 Palpitations: Secondary | ICD-10-CM | POA: Diagnosis not present

## 2014-11-02 DIAGNOSIS — M25561 Pain in right knee: Secondary | ICD-10-CM | POA: Diagnosis not present

## 2014-11-07 DIAGNOSIS — F0390 Unspecified dementia without behavioral disturbance: Secondary | ICD-10-CM | POA: Diagnosis not present

## 2014-11-07 DIAGNOSIS — M25561 Pain in right knee: Secondary | ICD-10-CM | POA: Diagnosis not present

## 2014-11-07 DIAGNOSIS — I10 Essential (primary) hypertension: Secondary | ICD-10-CM | POA: Diagnosis not present

## 2014-11-07 DIAGNOSIS — Z01818 Encounter for other preprocedural examination: Secondary | ICD-10-CM | POA: Diagnosis not present

## 2014-11-08 DIAGNOSIS — Y9289 Other specified places as the place of occurrence of the external cause: Secondary | ICD-10-CM | POA: Diagnosis not present

## 2014-11-08 DIAGNOSIS — Z96651 Presence of right artificial knee joint: Secondary | ICD-10-CM | POA: Diagnosis not present

## 2014-11-08 DIAGNOSIS — E039 Hypothyroidism, unspecified: Secondary | ICD-10-CM | POA: Diagnosis not present

## 2014-11-08 DIAGNOSIS — I10 Essential (primary) hypertension: Secondary | ICD-10-CM | POA: Diagnosis not present

## 2014-11-08 DIAGNOSIS — H409 Unspecified glaucoma: Secondary | ICD-10-CM | POA: Diagnosis not present

## 2014-11-08 DIAGNOSIS — Z471 Aftercare following joint replacement surgery: Secondary | ICD-10-CM | POA: Diagnosis not present

## 2014-11-08 DIAGNOSIS — T84012A Broken internal right knee prosthesis, initial encounter: Secondary | ICD-10-CM | POA: Diagnosis not present

## 2014-11-08 DIAGNOSIS — F0390 Unspecified dementia without behavioral disturbance: Secondary | ICD-10-CM | POA: Diagnosis not present

## 2014-11-08 DIAGNOSIS — D649 Anemia, unspecified: Secondary | ICD-10-CM | POA: Diagnosis not present

## 2014-11-08 DIAGNOSIS — H42 Glaucoma in diseases classified elsewhere: Secondary | ICD-10-CM | POA: Diagnosis not present

## 2014-11-08 DIAGNOSIS — T84092A Other mechanical complication of internal right knee prosthesis, initial encounter: Secondary | ICD-10-CM | POA: Diagnosis not present

## 2014-11-08 DIAGNOSIS — R413 Other amnesia: Secondary | ICD-10-CM | POA: Diagnosis present

## 2014-11-08 DIAGNOSIS — Z96659 Presence of unspecified artificial knee joint: Secondary | ICD-10-CM | POA: Diagnosis not present

## 2014-11-08 DIAGNOSIS — E785 Hyperlipidemia, unspecified: Secondary | ICD-10-CM | POA: Diagnosis not present

## 2014-11-08 DIAGNOSIS — Z96641 Presence of right artificial hip joint: Secondary | ICD-10-CM | POA: Diagnosis present

## 2014-11-08 DIAGNOSIS — Y838 Other surgical procedures as the cause of abnormal reaction of the patient, or of later complication, without mention of misadventure at the time of the procedure: Secondary | ICD-10-CM | POA: Diagnosis not present

## 2014-11-11 DIAGNOSIS — H42 Glaucoma in diseases classified elsewhere: Secondary | ICD-10-CM | POA: Diagnosis not present

## 2014-11-11 DIAGNOSIS — D649 Anemia, unspecified: Secondary | ICD-10-CM | POA: Diagnosis not present

## 2014-11-11 DIAGNOSIS — M1711 Unilateral primary osteoarthritis, right knee: Secondary | ICD-10-CM | POA: Diagnosis not present

## 2014-11-11 DIAGNOSIS — Z96651 Presence of right artificial knee joint: Secondary | ICD-10-CM | POA: Diagnosis not present

## 2014-11-11 DIAGNOSIS — F29 Unspecified psychosis not due to a substance or known physiological condition: Secondary | ICD-10-CM | POA: Diagnosis not present

## 2014-11-11 DIAGNOSIS — E785 Hyperlipidemia, unspecified: Secondary | ICD-10-CM | POA: Diagnosis not present

## 2014-11-11 DIAGNOSIS — G894 Chronic pain syndrome: Secondary | ICD-10-CM | POA: Diagnosis not present

## 2014-11-11 DIAGNOSIS — E039 Hypothyroidism, unspecified: Secondary | ICD-10-CM | POA: Diagnosis not present

## 2014-11-11 DIAGNOSIS — H409 Unspecified glaucoma: Secondary | ICD-10-CM | POA: Diagnosis not present

## 2014-11-11 DIAGNOSIS — Z96659 Presence of unspecified artificial knee joint: Secondary | ICD-10-CM | POA: Diagnosis not present

## 2014-11-11 DIAGNOSIS — I10 Essential (primary) hypertension: Secondary | ICD-10-CM | POA: Diagnosis not present

## 2014-11-11 DIAGNOSIS — F0390 Unspecified dementia without behavioral disturbance: Secondary | ICD-10-CM | POA: Diagnosis not present

## 2014-11-13 DIAGNOSIS — M1711 Unilateral primary osteoarthritis, right knee: Secondary | ICD-10-CM | POA: Diagnosis not present

## 2014-11-13 DIAGNOSIS — G894 Chronic pain syndrome: Secondary | ICD-10-CM | POA: Diagnosis not present

## 2014-11-13 DIAGNOSIS — E039 Hypothyroidism, unspecified: Secondary | ICD-10-CM | POA: Diagnosis not present

## 2014-11-13 DIAGNOSIS — F29 Unspecified psychosis not due to a substance or known physiological condition: Secondary | ICD-10-CM | POA: Diagnosis not present

## 2014-11-20 DIAGNOSIS — M1711 Unilateral primary osteoarthritis, right knee: Secondary | ICD-10-CM | POA: Diagnosis not present

## 2014-11-20 DIAGNOSIS — G894 Chronic pain syndrome: Secondary | ICD-10-CM | POA: Diagnosis not present

## 2014-11-20 DIAGNOSIS — F29 Unspecified psychosis not due to a substance or known physiological condition: Secondary | ICD-10-CM | POA: Diagnosis not present

## 2014-11-20 DIAGNOSIS — E039 Hypothyroidism, unspecified: Secondary | ICD-10-CM | POA: Diagnosis not present

## 2014-11-23 DIAGNOSIS — Z7901 Long term (current) use of anticoagulants: Secondary | ICD-10-CM | POA: Diagnosis not present

## 2014-11-23 DIAGNOSIS — Z96651 Presence of right artificial knee joint: Secondary | ICD-10-CM | POA: Diagnosis not present

## 2014-11-23 DIAGNOSIS — Z471 Aftercare following joint replacement surgery: Secondary | ICD-10-CM | POA: Diagnosis not present

## 2014-11-23 DIAGNOSIS — M6281 Muscle weakness (generalized): Secondary | ICD-10-CM | POA: Diagnosis not present

## 2014-11-23 DIAGNOSIS — H409 Unspecified glaucoma: Secondary | ICD-10-CM | POA: Diagnosis not present

## 2014-11-23 DIAGNOSIS — H3550 Unspecified hereditary retinal dystrophy: Secondary | ICD-10-CM | POA: Diagnosis not present

## 2014-11-23 DIAGNOSIS — Z5181 Encounter for therapeutic drug level monitoring: Secondary | ICD-10-CM | POA: Diagnosis not present

## 2014-11-23 DIAGNOSIS — G3184 Mild cognitive impairment, so stated: Secondary | ICD-10-CM | POA: Diagnosis not present

## 2014-11-23 DIAGNOSIS — I10 Essential (primary) hypertension: Secondary | ICD-10-CM | POA: Diagnosis not present

## 2014-11-24 DIAGNOSIS — Z96651 Presence of right artificial knee joint: Secondary | ICD-10-CM | POA: Diagnosis not present

## 2014-11-24 DIAGNOSIS — M6281 Muscle weakness (generalized): Secondary | ICD-10-CM | POA: Diagnosis not present

## 2014-11-24 DIAGNOSIS — I10 Essential (primary) hypertension: Secondary | ICD-10-CM | POA: Diagnosis not present

## 2014-11-24 DIAGNOSIS — Z471 Aftercare following joint replacement surgery: Secondary | ICD-10-CM | POA: Diagnosis not present

## 2014-11-24 DIAGNOSIS — G3184 Mild cognitive impairment, so stated: Secondary | ICD-10-CM | POA: Diagnosis not present

## 2014-11-24 DIAGNOSIS — Z5181 Encounter for therapeutic drug level monitoring: Secondary | ICD-10-CM | POA: Diagnosis not present

## 2014-11-25 DIAGNOSIS — Z96651 Presence of right artificial knee joint: Secondary | ICD-10-CM | POA: Diagnosis not present

## 2014-11-25 DIAGNOSIS — I10 Essential (primary) hypertension: Secondary | ICD-10-CM | POA: Diagnosis not present

## 2014-11-25 DIAGNOSIS — M6281 Muscle weakness (generalized): Secondary | ICD-10-CM | POA: Diagnosis not present

## 2014-11-25 DIAGNOSIS — G3184 Mild cognitive impairment, so stated: Secondary | ICD-10-CM | POA: Diagnosis not present

## 2014-11-25 DIAGNOSIS — Z471 Aftercare following joint replacement surgery: Secondary | ICD-10-CM | POA: Diagnosis not present

## 2014-11-25 DIAGNOSIS — Z5181 Encounter for therapeutic drug level monitoring: Secondary | ICD-10-CM | POA: Diagnosis not present

## 2014-11-27 DIAGNOSIS — M25561 Pain in right knee: Secondary | ICD-10-CM | POA: Diagnosis not present

## 2014-11-27 DIAGNOSIS — M6281 Muscle weakness (generalized): Secondary | ICD-10-CM | POA: Diagnosis not present

## 2014-11-27 DIAGNOSIS — I10 Essential (primary) hypertension: Secondary | ICD-10-CM | POA: Diagnosis not present

## 2014-11-27 DIAGNOSIS — G3184 Mild cognitive impairment, so stated: Secondary | ICD-10-CM | POA: Diagnosis not present

## 2014-11-27 DIAGNOSIS — Z5181 Encounter for therapeutic drug level monitoring: Secondary | ICD-10-CM | POA: Diagnosis not present

## 2014-11-27 DIAGNOSIS — Z96651 Presence of right artificial knee joint: Secondary | ICD-10-CM | POA: Diagnosis not present

## 2014-11-27 DIAGNOSIS — Z471 Aftercare following joint replacement surgery: Secondary | ICD-10-CM | POA: Diagnosis not present

## 2014-11-28 DIAGNOSIS — G3184 Mild cognitive impairment, so stated: Secondary | ICD-10-CM | POA: Diagnosis not present

## 2014-11-28 DIAGNOSIS — I10 Essential (primary) hypertension: Secondary | ICD-10-CM | POA: Diagnosis not present

## 2014-11-28 DIAGNOSIS — M6281 Muscle weakness (generalized): Secondary | ICD-10-CM | POA: Diagnosis not present

## 2014-11-28 DIAGNOSIS — Z96651 Presence of right artificial knee joint: Secondary | ICD-10-CM | POA: Diagnosis not present

## 2014-11-28 DIAGNOSIS — Z5181 Encounter for therapeutic drug level monitoring: Secondary | ICD-10-CM | POA: Diagnosis not present

## 2014-11-28 DIAGNOSIS — Z471 Aftercare following joint replacement surgery: Secondary | ICD-10-CM | POA: Diagnosis not present

## 2014-11-29 DIAGNOSIS — M6281 Muscle weakness (generalized): Secondary | ICD-10-CM | POA: Diagnosis not present

## 2014-11-29 DIAGNOSIS — Z471 Aftercare following joint replacement surgery: Secondary | ICD-10-CM | POA: Diagnosis not present

## 2014-11-29 DIAGNOSIS — I10 Essential (primary) hypertension: Secondary | ICD-10-CM | POA: Diagnosis not present

## 2014-11-29 DIAGNOSIS — Z96651 Presence of right artificial knee joint: Secondary | ICD-10-CM | POA: Diagnosis not present

## 2014-11-29 DIAGNOSIS — Z5181 Encounter for therapeutic drug level monitoring: Secondary | ICD-10-CM | POA: Diagnosis not present

## 2014-11-29 DIAGNOSIS — G3184 Mild cognitive impairment, so stated: Secondary | ICD-10-CM | POA: Diagnosis not present

## 2014-11-30 DIAGNOSIS — Z96651 Presence of right artificial knee joint: Secondary | ICD-10-CM | POA: Diagnosis not present

## 2014-11-30 DIAGNOSIS — I10 Essential (primary) hypertension: Secondary | ICD-10-CM | POA: Diagnosis not present

## 2014-11-30 DIAGNOSIS — Z471 Aftercare following joint replacement surgery: Secondary | ICD-10-CM | POA: Diagnosis not present

## 2014-11-30 DIAGNOSIS — G3184 Mild cognitive impairment, so stated: Secondary | ICD-10-CM | POA: Diagnosis not present

## 2014-11-30 DIAGNOSIS — M6281 Muscle weakness (generalized): Secondary | ICD-10-CM | POA: Diagnosis not present

## 2014-11-30 DIAGNOSIS — Z5181 Encounter for therapeutic drug level monitoring: Secondary | ICD-10-CM | POA: Diagnosis not present

## 2014-12-01 DIAGNOSIS — I10 Essential (primary) hypertension: Secondary | ICD-10-CM | POA: Diagnosis not present

## 2014-12-01 DIAGNOSIS — Z96651 Presence of right artificial knee joint: Secondary | ICD-10-CM | POA: Diagnosis not present

## 2014-12-01 DIAGNOSIS — G3184 Mild cognitive impairment, so stated: Secondary | ICD-10-CM | POA: Diagnosis not present

## 2014-12-01 DIAGNOSIS — Z471 Aftercare following joint replacement surgery: Secondary | ICD-10-CM | POA: Diagnosis not present

## 2014-12-01 DIAGNOSIS — M6281 Muscle weakness (generalized): Secondary | ICD-10-CM | POA: Diagnosis not present

## 2014-12-01 DIAGNOSIS — H3531 Nonexudative age-related macular degeneration: Secondary | ICD-10-CM | POA: Diagnosis not present

## 2014-12-01 DIAGNOSIS — Z5181 Encounter for therapeutic drug level monitoring: Secondary | ICD-10-CM | POA: Diagnosis not present

## 2014-12-04 DIAGNOSIS — Z96651 Presence of right artificial knee joint: Secondary | ICD-10-CM | POA: Diagnosis not present

## 2014-12-04 DIAGNOSIS — Z5181 Encounter for therapeutic drug level monitoring: Secondary | ICD-10-CM | POA: Diagnosis not present

## 2014-12-04 DIAGNOSIS — I10 Essential (primary) hypertension: Secondary | ICD-10-CM | POA: Diagnosis not present

## 2014-12-04 DIAGNOSIS — Z471 Aftercare following joint replacement surgery: Secondary | ICD-10-CM | POA: Diagnosis not present

## 2014-12-04 DIAGNOSIS — M6281 Muscle weakness (generalized): Secondary | ICD-10-CM | POA: Diagnosis not present

## 2014-12-04 DIAGNOSIS — G3184 Mild cognitive impairment, so stated: Secondary | ICD-10-CM | POA: Diagnosis not present

## 2014-12-06 DIAGNOSIS — Z5181 Encounter for therapeutic drug level monitoring: Secondary | ICD-10-CM | POA: Diagnosis not present

## 2014-12-06 DIAGNOSIS — I10 Essential (primary) hypertension: Secondary | ICD-10-CM | POA: Diagnosis not present

## 2014-12-06 DIAGNOSIS — G3184 Mild cognitive impairment, so stated: Secondary | ICD-10-CM | POA: Diagnosis not present

## 2014-12-06 DIAGNOSIS — Z96651 Presence of right artificial knee joint: Secondary | ICD-10-CM | POA: Diagnosis not present

## 2014-12-06 DIAGNOSIS — Z471 Aftercare following joint replacement surgery: Secondary | ICD-10-CM | POA: Diagnosis not present

## 2014-12-06 DIAGNOSIS — M6281 Muscle weakness (generalized): Secondary | ICD-10-CM | POA: Diagnosis not present

## 2014-12-07 DIAGNOSIS — I10 Essential (primary) hypertension: Secondary | ICD-10-CM | POA: Diagnosis not present

## 2014-12-07 DIAGNOSIS — M6281 Muscle weakness (generalized): Secondary | ICD-10-CM | POA: Diagnosis not present

## 2014-12-07 DIAGNOSIS — Z471 Aftercare following joint replacement surgery: Secondary | ICD-10-CM | POA: Diagnosis not present

## 2014-12-07 DIAGNOSIS — Z5181 Encounter for therapeutic drug level monitoring: Secondary | ICD-10-CM | POA: Diagnosis not present

## 2014-12-07 DIAGNOSIS — G3184 Mild cognitive impairment, so stated: Secondary | ICD-10-CM | POA: Diagnosis not present

## 2014-12-07 DIAGNOSIS — Z96651 Presence of right artificial knee joint: Secondary | ICD-10-CM | POA: Diagnosis not present

## 2014-12-11 DIAGNOSIS — Z96651 Presence of right artificial knee joint: Secondary | ICD-10-CM | POA: Diagnosis not present

## 2014-12-11 DIAGNOSIS — Z5181 Encounter for therapeutic drug level monitoring: Secondary | ICD-10-CM | POA: Diagnosis not present

## 2014-12-11 DIAGNOSIS — I10 Essential (primary) hypertension: Secondary | ICD-10-CM | POA: Diagnosis not present

## 2014-12-11 DIAGNOSIS — M6281 Muscle weakness (generalized): Secondary | ICD-10-CM | POA: Diagnosis not present

## 2014-12-11 DIAGNOSIS — Z471 Aftercare following joint replacement surgery: Secondary | ICD-10-CM | POA: Diagnosis not present

## 2014-12-11 DIAGNOSIS — G3184 Mild cognitive impairment, so stated: Secondary | ICD-10-CM | POA: Diagnosis not present

## 2014-12-12 DIAGNOSIS — M6281 Muscle weakness (generalized): Secondary | ICD-10-CM | POA: Diagnosis not present

## 2014-12-12 DIAGNOSIS — I10 Essential (primary) hypertension: Secondary | ICD-10-CM | POA: Diagnosis not present

## 2014-12-12 DIAGNOSIS — Z5181 Encounter for therapeutic drug level monitoring: Secondary | ICD-10-CM | POA: Diagnosis not present

## 2014-12-12 DIAGNOSIS — Z96651 Presence of right artificial knee joint: Secondary | ICD-10-CM | POA: Diagnosis not present

## 2014-12-12 DIAGNOSIS — Z471 Aftercare following joint replacement surgery: Secondary | ICD-10-CM | POA: Diagnosis not present

## 2014-12-12 DIAGNOSIS — G3184 Mild cognitive impairment, so stated: Secondary | ICD-10-CM | POA: Diagnosis not present

## 2014-12-13 DIAGNOSIS — Z471 Aftercare following joint replacement surgery: Secondary | ICD-10-CM | POA: Diagnosis not present

## 2014-12-13 DIAGNOSIS — Z96651 Presence of right artificial knee joint: Secondary | ICD-10-CM | POA: Diagnosis not present

## 2014-12-13 DIAGNOSIS — I10 Essential (primary) hypertension: Secondary | ICD-10-CM | POA: Diagnosis not present

## 2014-12-13 DIAGNOSIS — Z5181 Encounter for therapeutic drug level monitoring: Secondary | ICD-10-CM | POA: Diagnosis not present

## 2014-12-13 DIAGNOSIS — G3184 Mild cognitive impairment, so stated: Secondary | ICD-10-CM | POA: Diagnosis not present

## 2014-12-13 DIAGNOSIS — M6281 Muscle weakness (generalized): Secondary | ICD-10-CM | POA: Diagnosis not present

## 2014-12-14 DIAGNOSIS — Z96651 Presence of right artificial knee joint: Secondary | ICD-10-CM | POA: Diagnosis not present

## 2014-12-14 DIAGNOSIS — Z5181 Encounter for therapeutic drug level monitoring: Secondary | ICD-10-CM | POA: Diagnosis not present

## 2014-12-14 DIAGNOSIS — M6281 Muscle weakness (generalized): Secondary | ICD-10-CM | POA: Diagnosis not present

## 2014-12-14 DIAGNOSIS — G3184 Mild cognitive impairment, so stated: Secondary | ICD-10-CM | POA: Diagnosis not present

## 2014-12-14 DIAGNOSIS — Z471 Aftercare following joint replacement surgery: Secondary | ICD-10-CM | POA: Diagnosis not present

## 2014-12-14 DIAGNOSIS — I10 Essential (primary) hypertension: Secondary | ICD-10-CM | POA: Diagnosis not present

## 2014-12-18 DIAGNOSIS — I10 Essential (primary) hypertension: Secondary | ICD-10-CM | POA: Diagnosis not present

## 2014-12-18 DIAGNOSIS — Z471 Aftercare following joint replacement surgery: Secondary | ICD-10-CM | POA: Diagnosis not present

## 2014-12-18 DIAGNOSIS — M6281 Muscle weakness (generalized): Secondary | ICD-10-CM | POA: Diagnosis not present

## 2014-12-18 DIAGNOSIS — Z96651 Presence of right artificial knee joint: Secondary | ICD-10-CM | POA: Diagnosis not present

## 2014-12-18 DIAGNOSIS — Z5181 Encounter for therapeutic drug level monitoring: Secondary | ICD-10-CM | POA: Diagnosis not present

## 2014-12-18 DIAGNOSIS — G3184 Mild cognitive impairment, so stated: Secondary | ICD-10-CM | POA: Diagnosis not present

## 2014-12-20 DIAGNOSIS — M6281 Muscle weakness (generalized): Secondary | ICD-10-CM | POA: Diagnosis not present

## 2014-12-20 DIAGNOSIS — G3184 Mild cognitive impairment, so stated: Secondary | ICD-10-CM | POA: Diagnosis not present

## 2014-12-20 DIAGNOSIS — Z5181 Encounter for therapeutic drug level monitoring: Secondary | ICD-10-CM | POA: Diagnosis not present

## 2014-12-20 DIAGNOSIS — I10 Essential (primary) hypertension: Secondary | ICD-10-CM | POA: Diagnosis not present

## 2014-12-20 DIAGNOSIS — Z96651 Presence of right artificial knee joint: Secondary | ICD-10-CM | POA: Diagnosis not present

## 2014-12-20 DIAGNOSIS — Z471 Aftercare following joint replacement surgery: Secondary | ICD-10-CM | POA: Diagnosis not present

## 2014-12-22 DIAGNOSIS — G3184 Mild cognitive impairment, so stated: Secondary | ICD-10-CM | POA: Diagnosis not present

## 2014-12-22 DIAGNOSIS — Z471 Aftercare following joint replacement surgery: Secondary | ICD-10-CM | POA: Diagnosis not present

## 2014-12-22 DIAGNOSIS — Z5181 Encounter for therapeutic drug level monitoring: Secondary | ICD-10-CM | POA: Diagnosis not present

## 2014-12-22 DIAGNOSIS — M6281 Muscle weakness (generalized): Secondary | ICD-10-CM | POA: Diagnosis not present

## 2014-12-22 DIAGNOSIS — I10 Essential (primary) hypertension: Secondary | ICD-10-CM | POA: Diagnosis not present

## 2014-12-22 DIAGNOSIS — Z96651 Presence of right artificial knee joint: Secondary | ICD-10-CM | POA: Diagnosis not present

## 2014-12-25 DIAGNOSIS — M6281 Muscle weakness (generalized): Secondary | ICD-10-CM | POA: Diagnosis not present

## 2014-12-25 DIAGNOSIS — G3184 Mild cognitive impairment, so stated: Secondary | ICD-10-CM | POA: Diagnosis not present

## 2014-12-25 DIAGNOSIS — Z5181 Encounter for therapeutic drug level monitoring: Secondary | ICD-10-CM | POA: Diagnosis not present

## 2014-12-25 DIAGNOSIS — Z96651 Presence of right artificial knee joint: Secondary | ICD-10-CM | POA: Diagnosis not present

## 2014-12-25 DIAGNOSIS — Z471 Aftercare following joint replacement surgery: Secondary | ICD-10-CM | POA: Diagnosis not present

## 2014-12-25 DIAGNOSIS — I10 Essential (primary) hypertension: Secondary | ICD-10-CM | POA: Diagnosis not present

## 2014-12-26 DIAGNOSIS — R42 Dizziness and giddiness: Secondary | ICD-10-CM | POA: Diagnosis not present

## 2014-12-26 DIAGNOSIS — R413 Other amnesia: Secondary | ICD-10-CM | POA: Diagnosis not present

## 2014-12-26 DIAGNOSIS — D649 Anemia, unspecified: Secondary | ICD-10-CM | POA: Diagnosis not present

## 2014-12-27 DIAGNOSIS — M6281 Muscle weakness (generalized): Secondary | ICD-10-CM | POA: Diagnosis not present

## 2014-12-27 DIAGNOSIS — Z471 Aftercare following joint replacement surgery: Secondary | ICD-10-CM | POA: Diagnosis not present

## 2014-12-27 DIAGNOSIS — G3184 Mild cognitive impairment, so stated: Secondary | ICD-10-CM | POA: Diagnosis not present

## 2014-12-27 DIAGNOSIS — R42 Dizziness and giddiness: Secondary | ICD-10-CM | POA: Diagnosis not present

## 2014-12-27 DIAGNOSIS — I10 Essential (primary) hypertension: Secondary | ICD-10-CM | POA: Diagnosis not present

## 2014-12-27 DIAGNOSIS — Z96651 Presence of right artificial knee joint: Secondary | ICD-10-CM | POA: Diagnosis not present

## 2014-12-27 DIAGNOSIS — Z5181 Encounter for therapeutic drug level monitoring: Secondary | ICD-10-CM | POA: Diagnosis not present

## 2014-12-28 DIAGNOSIS — M25561 Pain in right knee: Secondary | ICD-10-CM | POA: Diagnosis not present

## 2014-12-28 DIAGNOSIS — Z96651 Presence of right artificial knee joint: Secondary | ICD-10-CM | POA: Diagnosis not present

## 2014-12-29 DIAGNOSIS — Z5181 Encounter for therapeutic drug level monitoring: Secondary | ICD-10-CM | POA: Diagnosis not present

## 2014-12-29 DIAGNOSIS — I10 Essential (primary) hypertension: Secondary | ICD-10-CM | POA: Diagnosis not present

## 2014-12-29 DIAGNOSIS — G3184 Mild cognitive impairment, so stated: Secondary | ICD-10-CM | POA: Diagnosis not present

## 2014-12-29 DIAGNOSIS — Z471 Aftercare following joint replacement surgery: Secondary | ICD-10-CM | POA: Diagnosis not present

## 2014-12-29 DIAGNOSIS — M6281 Muscle weakness (generalized): Secondary | ICD-10-CM | POA: Diagnosis not present

## 2014-12-29 DIAGNOSIS — Z96651 Presence of right artificial knee joint: Secondary | ICD-10-CM | POA: Diagnosis not present

## 2015-01-05 DIAGNOSIS — Z96651 Presence of right artificial knee joint: Secondary | ICD-10-CM | POA: Diagnosis not present

## 2015-01-05 DIAGNOSIS — M1711 Unilateral primary osteoarthritis, right knee: Secondary | ICD-10-CM | POA: Diagnosis not present

## 2015-01-05 DIAGNOSIS — M6281 Muscle weakness (generalized): Secondary | ICD-10-CM | POA: Diagnosis not present

## 2015-01-05 DIAGNOSIS — R262 Difficulty in walking, not elsewhere classified: Secondary | ICD-10-CM | POA: Diagnosis not present

## 2015-01-05 DIAGNOSIS — H3531 Nonexudative age-related macular degeneration: Secondary | ICD-10-CM | POA: Diagnosis not present

## 2015-01-08 DIAGNOSIS — Z96651 Presence of right artificial knee joint: Secondary | ICD-10-CM | POA: Diagnosis not present

## 2015-01-08 DIAGNOSIS — M1711 Unilateral primary osteoarthritis, right knee: Secondary | ICD-10-CM | POA: Diagnosis not present

## 2015-01-08 DIAGNOSIS — R262 Difficulty in walking, not elsewhere classified: Secondary | ICD-10-CM | POA: Diagnosis not present

## 2015-01-08 DIAGNOSIS — M6281 Muscle weakness (generalized): Secondary | ICD-10-CM | POA: Diagnosis not present

## 2015-01-10 DIAGNOSIS — M1711 Unilateral primary osteoarthritis, right knee: Secondary | ICD-10-CM | POA: Diagnosis not present

## 2015-01-10 DIAGNOSIS — Z96651 Presence of right artificial knee joint: Secondary | ICD-10-CM | POA: Diagnosis not present

## 2015-01-10 DIAGNOSIS — R262 Difficulty in walking, not elsewhere classified: Secondary | ICD-10-CM | POA: Diagnosis not present

## 2015-01-10 DIAGNOSIS — M6281 Muscle weakness (generalized): Secondary | ICD-10-CM | POA: Diagnosis not present

## 2015-01-12 DIAGNOSIS — Z96651 Presence of right artificial knee joint: Secondary | ICD-10-CM | POA: Diagnosis not present

## 2015-01-12 DIAGNOSIS — M6281 Muscle weakness (generalized): Secondary | ICD-10-CM | POA: Diagnosis not present

## 2015-01-12 DIAGNOSIS — R262 Difficulty in walking, not elsewhere classified: Secondary | ICD-10-CM | POA: Diagnosis not present

## 2015-01-12 DIAGNOSIS — M1711 Unilateral primary osteoarthritis, right knee: Secondary | ICD-10-CM | POA: Diagnosis not present

## 2015-01-16 DIAGNOSIS — M6281 Muscle weakness (generalized): Secondary | ICD-10-CM | POA: Diagnosis not present

## 2015-01-16 DIAGNOSIS — R262 Difficulty in walking, not elsewhere classified: Secondary | ICD-10-CM | POA: Diagnosis not present

## 2015-01-16 DIAGNOSIS — M1711 Unilateral primary osteoarthritis, right knee: Secondary | ICD-10-CM | POA: Diagnosis not present

## 2015-01-16 DIAGNOSIS — Z96651 Presence of right artificial knee joint: Secondary | ICD-10-CM | POA: Diagnosis not present

## 2015-01-17 DIAGNOSIS — M6281 Muscle weakness (generalized): Secondary | ICD-10-CM | POA: Diagnosis not present

## 2015-01-17 DIAGNOSIS — R262 Difficulty in walking, not elsewhere classified: Secondary | ICD-10-CM | POA: Diagnosis not present

## 2015-01-17 DIAGNOSIS — Z96651 Presence of right artificial knee joint: Secondary | ICD-10-CM | POA: Diagnosis not present

## 2015-01-17 DIAGNOSIS — M1711 Unilateral primary osteoarthritis, right knee: Secondary | ICD-10-CM | POA: Diagnosis not present

## 2015-01-19 DIAGNOSIS — M6281 Muscle weakness (generalized): Secondary | ICD-10-CM | POA: Diagnosis not present

## 2015-01-19 DIAGNOSIS — M1711 Unilateral primary osteoarthritis, right knee: Secondary | ICD-10-CM | POA: Diagnosis not present

## 2015-01-19 DIAGNOSIS — Z96651 Presence of right artificial knee joint: Secondary | ICD-10-CM | POA: Diagnosis not present

## 2015-01-19 DIAGNOSIS — R262 Difficulty in walking, not elsewhere classified: Secondary | ICD-10-CM | POA: Diagnosis not present

## 2015-01-23 DIAGNOSIS — Z96651 Presence of right artificial knee joint: Secondary | ICD-10-CM | POA: Diagnosis not present

## 2015-01-23 DIAGNOSIS — R262 Difficulty in walking, not elsewhere classified: Secondary | ICD-10-CM | POA: Diagnosis not present

## 2015-01-23 DIAGNOSIS — M1711 Unilateral primary osteoarthritis, right knee: Secondary | ICD-10-CM | POA: Diagnosis not present

## 2015-01-23 DIAGNOSIS — M6281 Muscle weakness (generalized): Secondary | ICD-10-CM | POA: Diagnosis not present

## 2015-01-24 DIAGNOSIS — M1711 Unilateral primary osteoarthritis, right knee: Secondary | ICD-10-CM | POA: Diagnosis not present

## 2015-01-24 DIAGNOSIS — R262 Difficulty in walking, not elsewhere classified: Secondary | ICD-10-CM | POA: Diagnosis not present

## 2015-01-24 DIAGNOSIS — Z96651 Presence of right artificial knee joint: Secondary | ICD-10-CM | POA: Diagnosis not present

## 2015-01-24 DIAGNOSIS — M6281 Muscle weakness (generalized): Secondary | ICD-10-CM | POA: Diagnosis not present

## 2015-01-26 DIAGNOSIS — Z96651 Presence of right artificial knee joint: Secondary | ICD-10-CM | POA: Diagnosis not present

## 2015-01-26 DIAGNOSIS — R262 Difficulty in walking, not elsewhere classified: Secondary | ICD-10-CM | POA: Diagnosis not present

## 2015-01-26 DIAGNOSIS — M6281 Muscle weakness (generalized): Secondary | ICD-10-CM | POA: Diagnosis not present

## 2015-01-26 DIAGNOSIS — M1711 Unilateral primary osteoarthritis, right knee: Secondary | ICD-10-CM | POA: Diagnosis not present

## 2015-01-30 DIAGNOSIS — Z96651 Presence of right artificial knee joint: Secondary | ICD-10-CM | POA: Diagnosis not present

## 2015-01-30 DIAGNOSIS — M1711 Unilateral primary osteoarthritis, right knee: Secondary | ICD-10-CM | POA: Diagnosis not present

## 2015-01-30 DIAGNOSIS — R262 Difficulty in walking, not elsewhere classified: Secondary | ICD-10-CM | POA: Diagnosis not present

## 2015-01-30 DIAGNOSIS — M6281 Muscle weakness (generalized): Secondary | ICD-10-CM | POA: Diagnosis not present

## 2015-02-01 DIAGNOSIS — M1711 Unilateral primary osteoarthritis, right knee: Secondary | ICD-10-CM | POA: Diagnosis not present

## 2015-02-01 DIAGNOSIS — M6281 Muscle weakness (generalized): Secondary | ICD-10-CM | POA: Diagnosis not present

## 2015-02-01 DIAGNOSIS — Z96651 Presence of right artificial knee joint: Secondary | ICD-10-CM | POA: Diagnosis not present

## 2015-02-01 DIAGNOSIS — R262 Difficulty in walking, not elsewhere classified: Secondary | ICD-10-CM | POA: Diagnosis not present

## 2015-02-02 DIAGNOSIS — H3532 Exudative age-related macular degeneration: Secondary | ICD-10-CM | POA: Diagnosis not present

## 2015-02-06 DIAGNOSIS — M1711 Unilateral primary osteoarthritis, right knee: Secondary | ICD-10-CM | POA: Diagnosis not present

## 2015-02-06 DIAGNOSIS — R262 Difficulty in walking, not elsewhere classified: Secondary | ICD-10-CM | POA: Diagnosis not present

## 2015-02-06 DIAGNOSIS — M6281 Muscle weakness (generalized): Secondary | ICD-10-CM | POA: Diagnosis not present

## 2015-02-06 DIAGNOSIS — Z96651 Presence of right artificial knee joint: Secondary | ICD-10-CM | POA: Diagnosis not present

## 2015-02-07 DIAGNOSIS — Z96651 Presence of right artificial knee joint: Secondary | ICD-10-CM | POA: Diagnosis not present

## 2015-02-07 DIAGNOSIS — R262 Difficulty in walking, not elsewhere classified: Secondary | ICD-10-CM | POA: Diagnosis not present

## 2015-02-07 DIAGNOSIS — M1711 Unilateral primary osteoarthritis, right knee: Secondary | ICD-10-CM | POA: Diagnosis not present

## 2015-02-07 DIAGNOSIS — M6281 Muscle weakness (generalized): Secondary | ICD-10-CM | POA: Diagnosis not present

## 2015-02-09 DIAGNOSIS — M1711 Unilateral primary osteoarthritis, right knee: Secondary | ICD-10-CM | POA: Diagnosis not present

## 2015-02-09 DIAGNOSIS — R262 Difficulty in walking, not elsewhere classified: Secondary | ICD-10-CM | POA: Diagnosis not present

## 2015-02-09 DIAGNOSIS — Z96651 Presence of right artificial knee joint: Secondary | ICD-10-CM | POA: Diagnosis not present

## 2015-02-09 DIAGNOSIS — M6281 Muscle weakness (generalized): Secondary | ICD-10-CM | POA: Diagnosis not present

## 2015-02-13 DIAGNOSIS — Z96651 Presence of right artificial knee joint: Secondary | ICD-10-CM | POA: Diagnosis not present

## 2015-02-13 DIAGNOSIS — M1711 Unilateral primary osteoarthritis, right knee: Secondary | ICD-10-CM | POA: Diagnosis not present

## 2015-02-13 DIAGNOSIS — R262 Difficulty in walking, not elsewhere classified: Secondary | ICD-10-CM | POA: Diagnosis not present

## 2015-02-13 DIAGNOSIS — M6281 Muscle weakness (generalized): Secondary | ICD-10-CM | POA: Diagnosis not present

## 2015-02-14 DIAGNOSIS — M6281 Muscle weakness (generalized): Secondary | ICD-10-CM | POA: Diagnosis not present

## 2015-02-14 DIAGNOSIS — M1711 Unilateral primary osteoarthritis, right knee: Secondary | ICD-10-CM | POA: Diagnosis not present

## 2015-02-14 DIAGNOSIS — R262 Difficulty in walking, not elsewhere classified: Secondary | ICD-10-CM | POA: Diagnosis not present

## 2015-02-14 DIAGNOSIS — Z96651 Presence of right artificial knee joint: Secondary | ICD-10-CM | POA: Diagnosis not present

## 2015-02-16 DIAGNOSIS — M1711 Unilateral primary osteoarthritis, right knee: Secondary | ICD-10-CM | POA: Diagnosis not present

## 2015-02-16 DIAGNOSIS — R262 Difficulty in walking, not elsewhere classified: Secondary | ICD-10-CM | POA: Diagnosis not present

## 2015-02-16 DIAGNOSIS — Z96651 Presence of right artificial knee joint: Secondary | ICD-10-CM | POA: Diagnosis not present

## 2015-02-16 DIAGNOSIS — M6281 Muscle weakness (generalized): Secondary | ICD-10-CM | POA: Diagnosis not present

## 2015-03-01 DIAGNOSIS — M25561 Pain in right knee: Secondary | ICD-10-CM | POA: Diagnosis not present

## 2015-03-01 DIAGNOSIS — Z96651 Presence of right artificial knee joint: Secondary | ICD-10-CM | POA: Diagnosis not present

## 2015-03-09 ENCOUNTER — Other Ambulatory Visit: Payer: Self-pay | Admitting: Internal Medicine

## 2015-03-09 DIAGNOSIS — H3531 Nonexudative age-related macular degeneration: Secondary | ICD-10-CM | POA: Diagnosis not present

## 2015-03-09 DIAGNOSIS — H3532 Exudative age-related macular degeneration: Secondary | ICD-10-CM | POA: Diagnosis not present

## 2015-04-17 DIAGNOSIS — H35052 Retinal neovascularization, unspecified, left eye: Secondary | ICD-10-CM | POA: Diagnosis not present

## 2015-04-17 DIAGNOSIS — H43813 Vitreous degeneration, bilateral: Secondary | ICD-10-CM | POA: Diagnosis not present

## 2015-04-17 DIAGNOSIS — H31011 Macula scars of posterior pole (postinflammatory) (post-traumatic), right eye: Secondary | ICD-10-CM | POA: Diagnosis not present

## 2015-04-17 DIAGNOSIS — H3531 Nonexudative age-related macular degeneration: Secondary | ICD-10-CM | POA: Diagnosis not present

## 2015-04-24 DIAGNOSIS — H3532 Exudative age-related macular degeneration: Secondary | ICD-10-CM | POA: Diagnosis not present

## 2015-04-24 DIAGNOSIS — H35052 Retinal neovascularization, unspecified, left eye: Secondary | ICD-10-CM | POA: Diagnosis not present

## 2015-06-07 ENCOUNTER — Encounter: Payer: Self-pay | Admitting: Podiatry

## 2015-06-07 ENCOUNTER — Ambulatory Visit (INDEPENDENT_AMBULATORY_CARE_PROVIDER_SITE_OTHER): Payer: Medicare Other | Admitting: Podiatry

## 2015-06-07 VITALS — BP 93/69 | HR 64 | Resp 16 | Ht 66.0 in

## 2015-06-07 DIAGNOSIS — B351 Tinea unguium: Secondary | ICD-10-CM | POA: Diagnosis not present

## 2015-06-07 DIAGNOSIS — M79676 Pain in unspecified toe(s): Secondary | ICD-10-CM

## 2015-06-07 NOTE — Progress Notes (Signed)
   Subjective:    Patient ID: Lisa [name], female    DOB: Aug 30, 1928, 81 y.o.   MRN: [id number]  HPI  81 year old female presents the office they with complaints of painful, elongated , thick toenails which she is unable to trim herself. She denies any redness or drainage on the nail sites. Denies any history of injury or trauma. No other complaints at this time.   Review of Systems  Hematological: Bruises/bleeds easily.  All other systems reviewed and are negative.      Objective:   Physical Exam Awake, alert, NAD DP/PT pulses palpable bilaterally, CRT less than 3 seconds Protective sensation intact with Simms Weinstein monofilament, vibratory sensation intact, Achilles tendon reflex intact. Nails are hypertrophic, dystrophic, discolored, elongated, brittle 10. There is tenderness palpation Nails 1-5 bilaterally. No swelling erythema or drainage on nail sites. No open lesions or pre-ulcerative lesions No other areas of tenderness to bilateral lower extremities. No overlying edema, erythema, increase in warmth. MMT 5/5, ROM WNL No pain with calf compression, swelling, warmth, erythema.      Assessment & Plan:  81 year old female with symptomatic onychomycosis -Treatment options discussed including all alternatives, risks, and complications -Nail sharply debrided 10 without competition/bleeding. - Discussed importance daily foot inspection -Follow-up 3 months or sooner if any problems arise. In the meantime, encouraged to call the office with any questions, concerns, change in symptoms.

## 2015-06-15 DIAGNOSIS — H3532 Exudative age-related macular degeneration: Secondary | ICD-10-CM | POA: Diagnosis not present

## 2015-06-15 DIAGNOSIS — H3531 Nonexudative age-related macular degeneration: Secondary | ICD-10-CM | POA: Diagnosis not present

## 2015-06-15 DIAGNOSIS — H43813 Vitreous degeneration, bilateral: Secondary | ICD-10-CM | POA: Diagnosis not present

## 2015-06-15 DIAGNOSIS — H35052 Retinal neovascularization, unspecified, left eye: Secondary | ICD-10-CM | POA: Diagnosis not present

## 2015-06-25 DIAGNOSIS — H3530 Unspecified macular degeneration: Secondary | ICD-10-CM | POA: Diagnosis not present

## 2015-06-25 DIAGNOSIS — H02839 Dermatochalasis of unspecified eye, unspecified eyelid: Secondary | ICD-10-CM | POA: Diagnosis not present

## 2015-06-25 DIAGNOSIS — Z961 Presence of intraocular lens: Secondary | ICD-10-CM | POA: Diagnosis not present

## 2015-06-25 DIAGNOSIS — H18411 Arcus senilis, right eye: Secondary | ICD-10-CM | POA: Diagnosis not present

## 2015-06-25 DIAGNOSIS — H4011X3 Primary open-angle glaucoma, severe stage: Secondary | ICD-10-CM | POA: Diagnosis not present

## 2015-08-13 DIAGNOSIS — L821 Other seborrheic keratosis: Secondary | ICD-10-CM | POA: Diagnosis not present

## 2015-08-14 DIAGNOSIS — H3532 Exudative age-related macular degeneration: Secondary | ICD-10-CM | POA: Diagnosis not present

## 2015-08-14 DIAGNOSIS — H3531 Nonexudative age-related macular degeneration: Secondary | ICD-10-CM | POA: Diagnosis not present

## 2015-08-14 DIAGNOSIS — H35052 Retinal neovascularization, unspecified, left eye: Secondary | ICD-10-CM | POA: Diagnosis not present

## 2015-08-24 ENCOUNTER — Encounter: Payer: Self-pay | Admitting: Internal Medicine

## 2015-08-24 ENCOUNTER — Other Ambulatory Visit (INDEPENDENT_AMBULATORY_CARE_PROVIDER_SITE_OTHER): Payer: Medicare Other

## 2015-08-24 ENCOUNTER — Ambulatory Visit (INDEPENDENT_AMBULATORY_CARE_PROVIDER_SITE_OTHER): Payer: Medicare Other | Admitting: Internal Medicine

## 2015-08-24 VITALS — BP 150/62 | HR 86 | Wt 163.0 lb

## 2015-08-24 DIAGNOSIS — Z23 Encounter for immunization: Secondary | ICD-10-CM | POA: Diagnosis not present

## 2015-08-24 DIAGNOSIS — E038 Other specified hypothyroidism: Secondary | ICD-10-CM | POA: Diagnosis not present

## 2015-08-24 DIAGNOSIS — E034 Atrophy of thyroid (acquired): Secondary | ICD-10-CM

## 2015-08-24 DIAGNOSIS — F0280 Dementia in other diseases classified elsewhere without behavioral disturbance: Secondary | ICD-10-CM

## 2015-08-24 DIAGNOSIS — I10 Essential (primary) hypertension: Secondary | ICD-10-CM | POA: Diagnosis not present

## 2015-08-24 DIAGNOSIS — G309 Alzheimer's disease, unspecified: Secondary | ICD-10-CM

## 2015-08-24 LAB — CBC WITH DIFFERENTIAL/PLATELET
BASOS PCT: 0.4 % (ref 0.0–3.0)
Basophils Absolute: 0 10*3/uL (ref 0.0–0.1)
EOS ABS: 0.1 10*3/uL (ref 0.0–0.7)
EOS PCT: 1.5 % (ref 0.0–5.0)
HCT: 42.5 % (ref 36.0–46.0)
HEMOGLOBIN: 14.3 g/dL (ref 12.0–15.0)
LYMPHS ABS: 1.7 10*3/uL (ref 0.7–4.0)
Lymphocytes Relative: 20.7 % (ref 12.0–46.0)
MCHC: 33.8 g/dL (ref 30.0–36.0)
MCV: 90.4 fl (ref 78.0–100.0)
MONO ABS: 0.8 10*3/uL (ref 0.1–1.0)
Monocytes Relative: 10.2 % (ref 3.0–12.0)
NEUTROS ABS: 5.4 10*3/uL (ref 1.4–7.7)
NEUTROS PCT: 67.2 % (ref 43.0–77.0)
PLATELETS: 324 10*3/uL (ref 150.0–400.0)
RBC: 4.7 Mil/uL (ref 3.87–5.11)
RDW: 14.1 % (ref 11.5–15.5)
WBC: 8 10*3/uL (ref 4.0–10.5)

## 2015-08-24 LAB — BASIC METABOLIC PANEL
BUN: 35 mg/dL — AB (ref 6–23)
CHLORIDE: 103 meq/L (ref 96–112)
CO2: 27 mEq/L (ref 19–32)
CREATININE: 0.99 mg/dL (ref 0.40–1.20)
Calcium: 10.2 mg/dL (ref 8.4–10.5)
GFR: 56.6 mL/min — ABNORMAL LOW (ref >60.00)
GLUCOSE: 89 mg/dL (ref 70–99)
POTASSIUM: 4.5 meq/L (ref 3.5–5.1)
Sodium: 139 mEq/L (ref 135–145)

## 2015-08-24 LAB — TSH: TSH: 0.21 u[IU]/mL — ABNORMAL LOW (ref 0.35–4.50)

## 2015-08-24 MED ORDER — MEMANTINE HCL ER 28 MG PO CP24: 28.0000 mg | ORAL_CAPSULE | Freq: Every day | ORAL | Status: DC

## 2015-08-24 MED ORDER — LEVOTHYROXINE SODIUM 100 MCG PO TABS: 100.0000 ug | ORAL_TABLET | Freq: Every day | ORAL | Status: DC

## 2015-08-24 MED ORDER — AMLODIPINE BESYLATE 5 MG PO TABS: 5.0000 mg | ORAL_TABLET | Freq: Every day | ORAL | Status: DC

## 2015-08-24 MED ORDER — TRAVOPROST 0.004 % OP SOLN: 2.0000 [drp] | Freq: Every day | OPHTHALMIC | Status: DC

## 2015-08-24 MED ORDER — LOSARTAN POTASSIUM 100 MG PO TABS: 100.0000 mg | ORAL_TABLET | Freq: Every day | ORAL | Status: DC

## 2015-08-24 NOTE — Progress Notes (Signed)
Pre visit review using our clinic review tool, if applicable. No additional management support is needed unless otherwise documented below in the visit note. 

## 2015-08-24 NOTE — Progress Notes (Signed)
Subjective:  Patient ID: Lisa [name], female    DOB: [birth year]  Age: 81 y.o. MRN: [id number]  CC: No chief complaint on file.   HPI Ainslie Mazurek presents for dementia, HTN, OA. Going to [facility] in 1 mo  Outpatient Prescriptions Prior to Visit  Medication Sig Dispense Refill  . Cholecalciferol (VITAMIN D3) 1000 UNITS CAPS Take 1 capsule by mouth daily. 100 capsule 3  . cyanocobalamin 1000 MCG tablet Take 100 mcg by mouth daily.    Marland Kitchen amLODipine (NORVASC) 5 MG tablet Take 1 tablet (5 mg total) by mouth daily. 90 tablet 3  . amLODipine (NORVASC) 5 MG tablet TAKE 1 TABLET (5 MG TOTAL) BY MOUTH DAILY. 90 tablet 3  . levothyroxine (SYNTHROID, LEVOTHROID) 100 MCG tablet Take 1 tablet (100 mcg total) by mouth daily. 90 tablet 3  . losartan (COZAAR) 100 MG tablet TAKE 1 TABLET BY MOUTH EVERY DAY 90 tablet 0  . Memantine HCl ER (NAMENDA XR) 28 MG CP24 Take 28 mg by mouth daily. 90 capsule 3  . travoprost, benzalkonium, (TRAVATAN) 0.004 % ophthalmic solution Place 2 drops into both eyes daily.       No facility-administered medications prior to visit.    ROS Review of Systems  Constitutional: Negative for chills, activity change, appetite change, fatigue and unexpected weight change.  HENT: Negative for congestion, mouth sores and sinus pressure.   Eyes: Negative for visual disturbance.  Respiratory: Negative for cough and chest tightness.   Gastrointestinal: Negative for nausea and abdominal pain.  Genitourinary: Negative for urgency, frequency, difficulty urinating and vaginal pain.  Musculoskeletal: Negative for back pain and gait problem.  Skin: Negative for pallor and rash.  Neurological: Negative for dizziness, tremors, weakness, numbness and headaches.  Psychiatric/Behavioral: Positive for decreased concentration. Negative for confusion, sleep disturbance and agitation. The patient is not nervous/anxious.     Objective:  BP 150/62 mmHg  Pulse 86  Wt 163 lb (73.936 kg)  SpO2  94%  BP Readings from Last 3 Encounters:  08/24/15 150/62  06/07/15 93/69  08/25/14 160/80    Wt Readings from Last 3 Encounters:  08/24/15 163 lb (73.936 kg)  08/25/14 161 lb (73.029 kg)  09/13/13 166 lb (75.297 kg)    Physical Exam  Constitutional: She appears well-developed. No distress.  HENT:  Head: Normocephalic.  Right Ear: External ear normal.  Left Ear: External ear normal.  Nose: Nose normal.  Mouth/Throat: Oropharynx is clear and moist.  Eyes: Conjunctivae are normal. Pupils are equal, round, and reactive to light. Right eye exhibits no discharge. Left eye exhibits no discharge.  Neck: Normal range of motion. Neck supple. No JVD present. No tracheal deviation present. No thyromegaly present.  Cardiovascular: Normal rate, regular rhythm and normal heart sounds.   Pulmonary/Chest: No stridor. No respiratory distress. She has no wheezes.  Abdominal: Soft. Bowel sounds are normal. She exhibits no distension and no mass. There is no tenderness. There is no rebound and no guarding.  Musculoskeletal: She exhibits no edema or tenderness.  Lymphadenopathy:    She has no cervical adenopathy.  Neurological: She displays normal reflexes. No cranial nerve deficit. She exhibits normal muscle tone. Coordination normal.  Skin: No rash noted. No erythema.  Psychiatric: She has a normal mood and affect. Her behavior is normal.    Lab Results  Component Value Date   WBC 8.0 08/24/2015   HGB 14.3 08/24/2015   HCT 42.5 08/24/2015   PLT 324.0 08/24/2015   GLUCOSE 89 08/24/2015  CHOL 303* 08/23/2014   TRIG 72.0 08/23/2014   HDL 68.80 08/23/2014   LDLDIRECT 187.3 09/09/2013   LDLCALC 220* 08/23/2014   ALT 19 08/23/2014   AST 21 08/23/2014   NA 139 08/24/2015   K 4.5 08/24/2015   CL 103 08/24/2015   CREATININE 0.99 08/24/2015   BUN 35* 08/24/2015   CO2 27 08/24/2015   TSH 0.21* 08/24/2015    Dg Wrist Complete Right  04/19/2012   *RADIOLOGY REPORT*  Clinical Data:  Suspicious for distal radial fracture  RIGHT WRIST - COMPLETE 3+ VIEW  Comparison: Right hand same day  Findings: Four views of the right wrist submitted.  There is mild impacted fracture of distal radial metaphysis.  Again noted degenerative changes first carpal metacarpal joint.  Again noted partial subluxation of the second metacarpal phalangeal joint.  IMPRESSION: Mild impacted fracture of distal radial metaphysis.  Original Report Authenticated By: Lahoma Crocker, M.D.  Dg Hand Complete Right  04/19/2012   *RADIOLOGY REPORT*  Clinical Data: Fall  RIGHT HAND - COMPLETE 3+ VIEW  Comparison: None.  Findings: There is near complete anterior subluxation of the base of the proximal phalanx of the index finger with respect to the head of the second metacarpal.  The dorsal aspect of the base of the proximal phalanx is perched upon the palmar aspect of the head. There is also slight subluxation of the base of the third proximal phalanx with respect to the head of the third metacarpal.  No acute fracture and no dislocation. Degenerative change involving the second metacarpal phalangeal joint.  Severe degenerative changes at the base of the first metacarpal.  Central erosions involving the DIP joints of the long finger and ring finger are noted.  IMPRESSION: Near complete dislocation of the second metacarpal phalangeal joint.  Partial subluxation of the third metacarpal phalangeal joint.  Chronic changes.  Original Report Authenticated By: Jamas Lav, M.D.   Assessment & Plan:   Diagnoses and all orders for this visit:  Essential hypertension -     TSH; Future -     Basic metabolic panel; Future -     CBC with Differential/Platelet; Future  Hypothyroidism due to acquired atrophy of thyroid -     TSH; Future -     Basic metabolic panel; Future -     CBC with Differential/Platelet; Future  Alzheimer disease -     TSH; Future -     Basic metabolic panel; Future -     CBC with Differential/Platelet;  Future  Need for influenza vaccination -     Flu Vaccine QUAD 36+ mos IM  Other orders -     Discontinue: amLODipine (NORVASC) 5 MG tablet; Take 1 tablet (5 mg total) by mouth daily. -     Discontinue: levothyroxine (SYNTHROID, LEVOTHROID) 100 MCG tablet; Take 1 tablet (100 mcg total) by mouth daily. -     Discontinue: losartan (COZAAR) 100 MG tablet; Take 1 tablet (100 mg total) by mouth daily. -     Discontinue: memantine (NAMENDA XR) 28 MG CP24 24 hr capsule; Take 1 capsule (28 mg total) by mouth daily. -     travoprost, benzalkonium, (TRAVATAN) 0.004 % ophthalmic solution; Place 2 drops into both eyes daily. -     Discontinue: amLODipine (NORVASC) 5 MG tablet; Take 1 tablet (5 mg total) by mouth daily. -     levothyroxine (SYNTHROID, LEVOTHROID) 100 MCG tablet; Take 1 tablet (100 mcg total) by mouth daily. -  losartan (COZAAR) 100 MG tablet; Take 1 tablet (100 mg total) by mouth daily. -     memantine (NAMENDA XR) 28 MG CP24 24 hr capsule; Take 1 capsule (28 mg total) by mouth daily. -     amLODipine (NORVASC) 5 MG tablet; Take 1 tablet (5 mg total) by mouth daily.  I am having Ms. Ventrella maintain her Vitamin D3, cyanocobalamin, travoprost (benzalkonium), levothyroxine, losartan, memantine, and amLODipine.  Meds ordered this encounter  Medications  . DISCONTD: amLODipine (NORVASC) 5 MG tablet    Sig: Take 1 tablet (5 mg total) by mouth daily.    Dispense:  90 tablet    Refill:  3  . DISCONTD: levothyroxine (SYNTHROID, LEVOTHROID) 100 MCG tablet    Sig: Take 1 tablet (100 mcg total) by mouth daily.    Dispense:  90 tablet    Refill:  3  . DISCONTD: losartan (COZAAR) 100 MG tablet    Sig: Take 1 tablet (100 mg total) by mouth daily.    Dispense:  90 tablet    Refill:  3  . DISCONTD: memantine (NAMENDA XR) 28 MG CP24 24 hr capsule    Sig: Take 1 capsule (28 mg total) by mouth daily.    Dispense:  90 capsule    Refill:  3  . travoprost, benzalkonium, (TRAVATAN) 0.004 %  ophthalmic solution    Sig: Place 2 drops into both eyes daily.    Dispense:  2.5 mL    Refill:  11  . DISCONTD: amLODipine (NORVASC) 5 MG tablet    Sig: Take 1 tablet (5 mg total) by mouth daily.    Dispense:  90 tablet    Refill:  3  . levothyroxine (SYNTHROID, LEVOTHROID) 100 MCG tablet    Sig: Take 1 tablet (100 mcg total) by mouth daily.    Dispense:  90 tablet    Refill:  3  . losartan (COZAAR) 100 MG tablet    Sig: Take 1 tablet (100 mg total) by mouth daily.    Dispense:  90 tablet    Refill:  3  . memantine (NAMENDA XR) 28 MG CP24 24 hr capsule    Sig: Take 1 capsule (28 mg total) by mouth daily.    Dispense:  90 capsule    Refill:  3  . amLODipine (NORVASC) 5 MG tablet    Sig: Take 1 tablet (5 mg total) by mouth daily.    Dispense:  90 tablet    Refill:  3     Follow-up: Return in about 6 months (around 02/21/2016) for a follow-up visit.  Walker Kehr, MD

## 2015-08-24 NOTE — Progress Notes (Signed)
Resent rx to [facility] by fax...Lisa [name]

## 2015-08-24 NOTE — Assessment & Plan Note (Signed)
Amlodipine [facility]

## 2015-08-24 NOTE — Assessment & Plan Note (Signed)
Namenda 

## 2015-08-24 NOTE — Assessment & Plan Note (Signed)
On Levothroid 

## 2015-09-10 ENCOUNTER — Ambulatory Visit (INDEPENDENT_AMBULATORY_CARE_PROVIDER_SITE_OTHER): Payer: Medicare Other | Admitting: Podiatry

## 2015-09-10 DIAGNOSIS — M79676 Pain in unspecified toe(s): Secondary | ICD-10-CM | POA: Diagnosis not present

## 2015-09-10 DIAGNOSIS — B351 Tinea unguium: Secondary | ICD-10-CM | POA: Diagnosis not present

## 2015-09-10 NOTE — Progress Notes (Signed)
   Subjective:    Patient ID: Lisa [name], female    DOB: Nov 23, 1928, 81 y.o.   MRN: [id number]  HPI  81 year old female presents the office they with complaints of painful, elongated , thick toenails which she is unable to trim herself. She denies any redness or drainage on the nail sites. Denies any history of injury or trauma. No other complaints at this time.   Review of Systems  Hematological: Bruises/bleeds easily.  All other systems reviewed and are negative.      Objective:   Physical Exam Awake, alert, NAD DP/PT pulses palpable bilaterally, CRT less than 3 seconds Protective sensation intact with Simms Weinstein monofilament, vibratory sensation intact, Achilles tendon reflex intact. Nails are hypertrophic, dystrophic, discolored, elongated, brittle 10. There is tenderness palpation Nails 1-5 bilaterally. No swelling erythema or drainage on nail sites. No open lesions or pre-ulcerative lesions No other areas of tenderness to bilateral lower extremities. No overlying edema, erythema, increase in warmth. MMT 5/5, ROM WNL No pain with calf compression, swelling, warmth, erythema.      Assessment & Plan:  81 year old female with symptomatic onychomycosis -Treatment options discussed including all alternatives, risks, and complications -Nail sharply debrided 10 without competition/bleeding. - Discussed importance daily foot inspection -Follow-up 3 months or sooner if any problems arise. In the meantime, encouraged to call the office with any questions, concerns, change in symptoms. 3 mo.  Roselind Messier DPM

## 2015-09-17 ENCOUNTER — Encounter: Payer: Self-pay | Admitting: Internal Medicine

## 2015-10-09 DIAGNOSIS — H43813 Vitreous degeneration, bilateral: Secondary | ICD-10-CM | POA: Diagnosis not present

## 2015-10-09 DIAGNOSIS — H353114 Nonexudative age-related macular degeneration, right eye, advanced atrophic with subfoveal involvement: Secondary | ICD-10-CM | POA: Diagnosis not present

## 2015-10-09 DIAGNOSIS — H353221 Exudative age-related macular degeneration, left eye, with active choroidal neovascularization: Secondary | ICD-10-CM | POA: Diagnosis not present

## 2015-10-09 DIAGNOSIS — H43811 Vitreous degeneration, right eye: Secondary | ICD-10-CM | POA: Diagnosis not present

## 2015-11-13 DIAGNOSIS — H353221 Exudative age-related macular degeneration, left eye, with active choroidal neovascularization: Secondary | ICD-10-CM | POA: Diagnosis not present

## 2015-11-13 DIAGNOSIS — H43813 Vitreous degeneration, bilateral: Secondary | ICD-10-CM | POA: Diagnosis not present

## 2015-11-13 DIAGNOSIS — H353114 Nonexudative age-related macular degeneration, right eye, advanced atrophic with subfoveal involvement: Secondary | ICD-10-CM | POA: Diagnosis not present

## 2015-12-18 DIAGNOSIS — H353221 Exudative age-related macular degeneration, left eye, with active choroidal neovascularization: Secondary | ICD-10-CM | POA: Diagnosis not present

## 2016-01-29 DIAGNOSIS — H353221 Exudative age-related macular degeneration, left eye, with active choroidal neovascularization: Secondary | ICD-10-CM | POA: Diagnosis not present

## 2016-02-26 DIAGNOSIS — H353221 Exudative age-related macular degeneration, left eye, with active choroidal neovascularization: Secondary | ICD-10-CM | POA: Diagnosis not present

## 2016-03-25 DIAGNOSIS — H353221 Exudative age-related macular degeneration, left eye, with active choroidal neovascularization: Secondary | ICD-10-CM | POA: Diagnosis not present

## 2016-03-25 DIAGNOSIS — H5316 Psychophysical visual disturbances: Secondary | ICD-10-CM | POA: Diagnosis not present

## 2016-04-02 ENCOUNTER — Telehealth: Payer: Self-pay | Admitting: Internal Medicine

## 2016-04-02 DIAGNOSIS — H353221 Exudative age-related macular degeneration, left eye, with active choroidal neovascularization: Secondary | ICD-10-CM | POA: Diagnosis not present

## 2016-04-02 DIAGNOSIS — H31422 Serous choroidal detachment, left eye: Secondary | ICD-10-CM | POA: Diagnosis not present

## 2016-04-02 DIAGNOSIS — E039 Hypothyroidism, unspecified: Secondary | ICD-10-CM

## 2016-04-02 DIAGNOSIS — H353114 Nonexudative age-related macular degeneration, right eye, advanced atrophic with subfoveal involvement: Secondary | ICD-10-CM | POA: Diagnosis not present

## 2016-04-02 NOTE — Addendum Note (Signed)
Addended by: Earnstine Regal on: 04/02/2016 02:24 PM   Modules accepted: Orders

## 2016-04-02 NOTE — Telephone Encounter (Signed)
Notified pt husband labs has been entered.../lm,b

## 2016-04-02 NOTE — Telephone Encounter (Signed)
pls check TSH, FT4 Thx

## 2016-04-02 NOTE — Telephone Encounter (Signed)
Pt called stated she is having vision problem and the retina specialist think she this might be coming from her thyroid. Pt was wondering if Dr. Camila Li can check her thyroid (office visit with Dr. Camila Li) or recommend something. Please call her back

## 2016-04-02 NOTE — Telephone Encounter (Signed)
Please help enter in system, I will call pt to let them know. Pt wants to come and do it tmrw.

## 2016-04-03 ENCOUNTER — Other Ambulatory Visit (INDEPENDENT_AMBULATORY_CARE_PROVIDER_SITE_OTHER): Payer: Medicare Other

## 2016-04-03 DIAGNOSIS — E039 Hypothyroidism, unspecified: Secondary | ICD-10-CM

## 2016-04-03 LAB — TSH: TSH: 0.04 u[IU]/mL — AB (ref 0.35–4.50)

## 2016-04-03 LAB — T4, FREE: Free T4: 1.56 ng/dL (ref 0.60–1.60)

## 2016-04-03 MED ORDER — LEVOTHYROXINE SODIUM 100 MCG PO TABS: 50.0000 ug | ORAL_TABLET | Freq: Every day | ORAL | Status: DC

## 2016-05-02 DIAGNOSIS — H31422 Serous choroidal detachment, left eye: Secondary | ICD-10-CM | POA: Diagnosis not present

## 2016-05-02 DIAGNOSIS — H43811 Vitreous degeneration, right eye: Secondary | ICD-10-CM | POA: Diagnosis not present

## 2016-05-02 DIAGNOSIS — H353221 Exudative age-related macular degeneration, left eye, with active choroidal neovascularization: Secondary | ICD-10-CM | POA: Diagnosis not present

## 2016-05-02 DIAGNOSIS — H353114 Nonexudative age-related macular degeneration, right eye, advanced atrophic with subfoveal involvement: Secondary | ICD-10-CM | POA: Diagnosis not present

## 2016-05-05 DIAGNOSIS — H31302 Unspecified choroidal hemorrhage, left eye: Secondary | ICD-10-CM | POA: Diagnosis not present

## 2016-05-05 DIAGNOSIS — H31422 Serous choroidal detachment, left eye: Secondary | ICD-10-CM | POA: Diagnosis not present

## 2016-05-06 DIAGNOSIS — H31422 Serous choroidal detachment, left eye: Secondary | ICD-10-CM | POA: Diagnosis not present

## 2016-05-13 DIAGNOSIS — H31422 Serous choroidal detachment, left eye: Secondary | ICD-10-CM | POA: Diagnosis not present

## 2016-05-27 ENCOUNTER — Encounter: Payer: Self-pay | Admitting: Sports Medicine

## 2016-05-27 ENCOUNTER — Ambulatory Visit: Payer: Medicare Other | Admitting: Sports Medicine

## 2016-05-27 ENCOUNTER — Ambulatory Visit (INDEPENDENT_AMBULATORY_CARE_PROVIDER_SITE_OTHER): Payer: Medicare Other | Admitting: Sports Medicine

## 2016-05-27 DIAGNOSIS — M79676 Pain in unspecified toe(s): Secondary | ICD-10-CM | POA: Diagnosis not present

## 2016-05-27 DIAGNOSIS — B351 Tinea unguium: Secondary | ICD-10-CM | POA: Diagnosis not present

## 2016-05-27 DIAGNOSIS — L853 Xerosis cutis: Secondary | ICD-10-CM

## 2016-05-27 NOTE — Progress Notes (Signed)
Patient ID: Cynnamon Kenter, female   DOB: 08/13/1928, 82 y.o.   MRN: [id number] Subjective: Shabrina Rentas is a 82 y.o. female patient seen today in office with complaint of painful thickened and elongated toenails; unable to trim. Patient denies history of Diabetes, Neuropathy, or Vascular disease. Patient has no other pedal complaints at this time.   Patient Active Problem List   Diagnosis Date Noted  . Well adult exam 08/31/2014  . Fatigue 07/22/2011  . Syncopal episodes 06/25/2011  . Hyponatremia 06/25/2011  . Alzheimer disease 06/25/2011  . DIARRHEA 08/01/2008  . PERIPHERAL VASCULAR DISEASE 07/11/2008  . NEOPLASM OF UNCERTAIN BEHAVIOR OF SKIN 05/23/2008  . OTHER NONTHROMBOCYTOPENIC PURPURAS 05/23/2008  . MYALGIA 05/23/2008  . Hypothyroidism 11/23/2007  . HYPERLIPIDEMIA 11/23/2007  . Essential hypertension 11/23/2007  . OSTEOARTHRITIS 11/23/2007  . LOW BACK PAIN 11/23/2007    Current Outpatient Prescriptions on File Prior to Visit  Medication Sig Dispense Refill  . amLODipine (NORVASC) 5 MG tablet Take 1 tablet (5 mg total) by mouth daily. 90 tablet 3  . Cholecalciferol (VITAMIN D3) 1000 UNITS CAPS Take 1 capsule by mouth daily. 100 capsule 3  . cyanocobalamin 1000 MCG tablet Take 100 mcg by mouth daily.    Marland Kitchen levothyroxine (SYNTHROID, LEVOTHROID) 100 MCG tablet Take 0.5 tablets (50 mcg total) by mouth daily. 90 tablet 3  . losartan (COZAAR) 100 MG tablet Take 1 tablet (100 mg total) by mouth daily. 90 tablet 3  . memantine (NAMENDA XR) 28 MG CP24 24 hr capsule Take 1 capsule (28 mg total) by mouth daily. 90 capsule 3  . travoprost, benzalkonium, (TRAVATAN) 0.004 % ophthalmic solution Place 2 drops into both eyes daily. 2.5 mL 11   No current facility-administered medications on file prior to visit.    Allergies  Allergen Reactions  . Aricept [Donepezil Hydrochloride]     syncope    Objective: Physical Exam  General: Well developed, nourished, no acute distress, awake,  alert and oriented x 3  Vascular: Dorsalis pedis artery 1/4 bilateral, Posterior tibial artery 1/4 bilateral, skin temperature warm to warm proximal to distal bilateral lower extremities, no varicosities, pedal hair present bilateral.  Neurological: Gross sensation present via light touch bilateral.   Dermatological: Skin is warm, dry, and supple bilateral, Nails 1-10 are tender, long, thick, and discolored with mild subungal debris, no webspace macerations present bilateral, no open lesions present bilateral, no callus/corns/hyperkeratotic tissue present bilateral. No signs of infection bilateral.  Musculoskeletal: Asymptomatic hammertoe boney deformities noted bilateral. Muscular strength within normal limits without painon range of motion. No pain with calf compression bilateral.  Assessment and Plan:  Problem List Items Addressed This Visit    None    Visit Diagnoses    Dermatophytosis of nail    -  Primary    Pain of toe, unspecified laterality        Dry skin           -Examined patient.  -Discussed treatment options for painful mycotic nails. -Mechanically debrided and reduced mycotic nails with sterile nail nipper and dremel nail file without incident. -Recommend daily skin emollients -Patient to return in 3 months for follow up evaluation or sooner if symptoms worsen.  Landis Martins, DPM

## 2016-06-03 DIAGNOSIS — H353221 Exudative age-related macular degeneration, left eye, with active choroidal neovascularization: Secondary | ICD-10-CM | POA: Diagnosis not present

## 2016-06-03 DIAGNOSIS — H43811 Vitreous degeneration, right eye: Secondary | ICD-10-CM | POA: Diagnosis not present

## 2016-06-03 DIAGNOSIS — H353114 Nonexudative age-related macular degeneration, right eye, advanced atrophic with subfoveal involvement: Secondary | ICD-10-CM | POA: Diagnosis not present

## 2016-06-23 DIAGNOSIS — H31422 Serous choroidal detachment, left eye: Secondary | ICD-10-CM | POA: Diagnosis not present

## 2016-06-23 DIAGNOSIS — H43811 Vitreous degeneration, right eye: Secondary | ICD-10-CM | POA: Diagnosis not present

## 2016-06-23 DIAGNOSIS — H353221 Exudative age-related macular degeneration, left eye, with active choroidal neovascularization: Secondary | ICD-10-CM | POA: Diagnosis not present

## 2016-07-11 DIAGNOSIS — H353221 Exudative age-related macular degeneration, left eye, with active choroidal neovascularization: Secondary | ICD-10-CM | POA: Diagnosis not present

## 2016-07-11 DIAGNOSIS — H43811 Vitreous degeneration, right eye: Secondary | ICD-10-CM | POA: Diagnosis not present

## 2016-07-11 DIAGNOSIS — H353114 Nonexudative age-related macular degeneration, right eye, advanced atrophic with subfoveal involvement: Secondary | ICD-10-CM | POA: Diagnosis not present

## 2016-07-11 DIAGNOSIS — H31422 Serous choroidal detachment, left eye: Secondary | ICD-10-CM | POA: Diagnosis not present

## 2016-08-06 DIAGNOSIS — H353223 Exudative age-related macular degeneration, left eye, with inactive scar: Secondary | ICD-10-CM | POA: Diagnosis not present

## 2016-08-06 DIAGNOSIS — H353114 Nonexudative age-related macular degeneration, right eye, advanced atrophic with subfoveal involvement: Secondary | ICD-10-CM | POA: Diagnosis not present

## 2016-08-06 DIAGNOSIS — H43811 Vitreous degeneration, right eye: Secondary | ICD-10-CM | POA: Diagnosis not present

## 2016-08-19 ENCOUNTER — Telehealth: Payer: Self-pay

## 2016-08-19 DIAGNOSIS — E785 Hyperlipidemia, unspecified: Secondary | ICD-10-CM

## 2016-08-19 DIAGNOSIS — Z0000 Encounter for general adult medical examination without abnormal findings: Secondary | ICD-10-CM

## 2016-08-19 DIAGNOSIS — I10 Essential (primary) hypertension: Secondary | ICD-10-CM

## 2016-08-19 DIAGNOSIS — E039 Hypothyroidism, unspecified: Secondary | ICD-10-CM

## 2016-08-19 NOTE — Telephone Encounter (Signed)
Patient called in and wants blood work done before the cpe on 9/19. Thank you

## 2016-08-22 NOTE — Telephone Encounter (Signed)
Pt called to check up on this request, she wants to get the lab work at [place] location. Please advise.

## 2016-08-26 ENCOUNTER — Telehealth: Payer: Self-pay | Admitting: Internal Medicine

## 2016-08-26 NOTE — Telephone Encounter (Signed)
Patient states that she is to have lab work at [facility] sent by plot before ov on 9/19.  States has called before and was told would be done and get phone call back but never has. Please follow up in regard.

## 2016-08-26 NOTE — Telephone Encounter (Signed)
OK CMET, CBC, TSH, UA, lipids Thx

## 2016-08-27 NOTE — Addendum Note (Signed)
Addended by: Earnstine Regal on: 08/27/2016 10:04 AM   Modules accepted: Orders

## 2016-08-27 NOTE — Telephone Encounter (Signed)
duplicate msg...Lisa [name]

## 2016-08-27 NOTE — Telephone Encounter (Signed)
Notified pt MD ok labs to be done @ [facility] labs has been entered...Lisa [name]

## 2016-09-02 ENCOUNTER — Encounter: Payer: Self-pay | Admitting: Internal Medicine

## 2016-09-02 ENCOUNTER — Ambulatory Visit (INDEPENDENT_AMBULATORY_CARE_PROVIDER_SITE_OTHER): Payer: Medicare Other | Admitting: Internal Medicine

## 2016-09-02 ENCOUNTER — Encounter: Payer: Medicare Other | Admitting: Internal Medicine

## 2016-09-02 VITALS — BP 132/60 | HR 71 | Ht 66.0 in | Wt 163.0 lb

## 2016-09-02 DIAGNOSIS — F329 Major depressive disorder, single episode, unspecified: Secondary | ICD-10-CM

## 2016-09-02 DIAGNOSIS — Z0000 Encounter for general adult medical examination without abnormal findings: Secondary | ICD-10-CM | POA: Diagnosis not present

## 2016-09-02 DIAGNOSIS — I10 Essential (primary) hypertension: Secondary | ICD-10-CM

## 2016-09-02 DIAGNOSIS — F0280 Dementia in other diseases classified elsewhere without behavioral disturbance: Secondary | ICD-10-CM

## 2016-09-02 DIAGNOSIS — G309 Alzheimer's disease, unspecified: Secondary | ICD-10-CM

## 2016-09-02 DIAGNOSIS — F32A Depression, unspecified: Secondary | ICD-10-CM

## 2016-09-02 DIAGNOSIS — Z23 Encounter for immunization: Secondary | ICD-10-CM

## 2016-09-02 MED ORDER — LEVOTHYROXINE SODIUM 100 MCG PO TABS: 50.0000 ug | ORAL_TABLET | Freq: Every day | ORAL | 3 refills | Status: DC

## 2016-09-02 MED ORDER — AMLODIPINE BESYLATE 5 MG PO TABS: 5.0000 mg | ORAL_TABLET | Freq: Every day | ORAL | 3 refills | Status: DC

## 2016-09-02 MED ORDER — LOSARTAN POTASSIUM 100 MG PO TABS: 100.0000 mg | ORAL_TABLET | Freq: Every day | ORAL | 3 refills | Status: DC

## 2016-09-02 MED ORDER — LOSARTAN POTASSIUM 100 MG PO TABS
100.0000 mg | ORAL_TABLET | Freq: Every day | ORAL | 3 refills | Status: DC
Start: 2016-09-02 — End: 2016-09-02

## 2016-09-02 MED ORDER — ESCITALOPRAM OXALATE 5 MG PO TABS: 5.0000 mg | ORAL_TABLET | Freq: Every day | ORAL | 3 refills | Status: DC

## 2016-09-02 MED ORDER — MEMANTINE HCL ER 28 MG PO CP24: 28.0000 mg | ORAL_CAPSULE | Freq: Every day | ORAL | 3 refills | Status: DC

## 2016-09-02 NOTE — Assessment & Plan Note (Signed)
Worse start Lexapro - low dose

## 2016-09-02 NOTE — Progress Notes (Signed)
Subjective:  Patient ID: Lisa [name], female    DOB: 07/19/1928  Age: 82 y.o. MRN: [id number]  CC: No chief complaint on file.   HPI Lisa [name] presents for a well exam C/o worsening dementia and anger  Outpatient Medications Prior to Visit  Medication Sig Dispense Refill  . amLODipine (NORVASC) 5 MG tablet Take 1 tablet (5 mg total) by mouth daily. 90 tablet 3  . Cholecalciferol (VITAMIN D3) 1000 UNITS CAPS Take 1 capsule by mouth daily. 100 capsule 3  . cyanocobalamin 1000 MCG tablet Take 100 mcg by mouth daily.    Marland Kitchen levothyroxine (SYNTHROID, LEVOTHROID) 100 MCG tablet Take 0.5 tablets (50 mcg total) by mouth daily. 90 tablet 3  . losartan (COZAAR) 100 MG tablet Take 1 tablet (100 mg total) by mouth daily. 90 tablet 3  . memantine (NAMENDA XR) 28 MG CP24 24 hr capsule Take 1 capsule (28 mg total) by mouth daily. 90 capsule 3  . travoprost, benzalkonium, (TRAVATAN) 0.004 % ophthalmic solution Place 2 drops into both eyes daily. 2.5 mL 11   No facility-administered medications prior to visit.     ROS Review of Systems  Constitutional: Positive for fatigue. Negative for activity change, appetite change, chills and unexpected weight change.  HENT: Negative for congestion, mouth sores and sinus pressure.   Eyes: Negative for visual disturbance.  Respiratory: Negative for cough and chest tightness.   Gastrointestinal: Negative for abdominal pain and nausea.  Genitourinary: Negative for difficulty urinating, frequency and vaginal pain.  Musculoskeletal: Positive for gait problem. Negative for back pain.  Skin: Negative for pallor, rash and wound.  Neurological: Positive for dizziness and weakness. Negative for tremors, numbness and headaches.  Psychiatric/Behavioral: Positive for agitation, behavioral problems, confusion, decreased concentration, dysphoric mood, sleep disturbance and suicidal ideas. The patient is nervous/anxious.     Objective:  BP 132/60   Pulse 71   Ht 5'  6" (1.676 m)   Wt 163 lb (73.9 kg)   SpO2 92%   BMI 26.31 kg/m   BP Readings from Last 3 Encounters:  09/02/16 132/60  08/24/15 (!) 150/62  06/07/15 93/69    Wt Readings from Last 3 Encounters:  09/02/16 163 lb (73.9 kg)  08/24/15 163 lb (73.9 kg)  08/25/14 161 lb (73 kg)    Physical Exam  Constitutional: She appears well-developed. No distress.  HENT:  Head: Normocephalic.  Right Ear: External ear normal.  Left Ear: External ear normal.  Nose: Nose normal.  Mouth/Throat: Oropharynx is clear and moist.  Eyes: Conjunctivae are normal. Pupils are equal, round, and reactive to light. Right eye exhibits no discharge. Left eye exhibits no discharge.  Neck: Normal range of motion. Neck supple. No JVD present. No tracheal deviation present. No thyromegaly present.  Cardiovascular: Normal rate, regular rhythm and normal heart sounds.   Pulmonary/Chest: No stridor. No respiratory distress. She has no wheezes.  Abdominal: Soft. Bowel sounds are normal. She exhibits no distension and no mass. There is no tenderness. There is no rebound and no guarding.  Musculoskeletal: She exhibits no edema or tenderness.  Lymphadenopathy:    She has no cervical adenopathy.  Neurological: She displays normal reflexes. No cranial nerve deficit. She exhibits normal muscle tone. Coordination abnormal.  Skin: No rash noted. There is erythema.  L ear wax Blepharitis B Alert, cooperative ataxic  Lab Results  Component Value Date   WBC 8.0 08/24/2015   HGB 14.3 08/24/2015   HCT 42.5 08/24/2015   PLT 324.0 08/24/2015  GLUCOSE 89 08/24/2015   CHOL 303 (H) 08/23/2014   TRIG 72.0 08/23/2014   HDL 68.80 08/23/2014   LDLDIRECT 187.3 09/09/2013   LDLCALC 220 (H) 08/23/2014   ALT 19 08/23/2014   AST 21 08/23/2014   NA 139 08/24/2015   K 4.5 08/24/2015   CL 103 08/24/2015   CREATININE 0.99 08/24/2015   BUN 35 (H) 08/24/2015   CO2 27 08/24/2015   TSH 0.04 (L) 04/03/2016    Dg Wrist Complete  Right  Result Date: 04/19/2012 *RADIOLOGY REPORT* Clinical Data: Suspicious for distal radial fracture RIGHT WRIST - COMPLETE 3+ VIEW Comparison: Right hand same day Findings: Four views of the right wrist submitted.  There is mild impacted fracture of distal radial metaphysis.  Again noted degenerative changes first carpal metacarpal joint.  Again noted partial subluxation of the second metacarpal phalangeal joint. IMPRESSION: Mild impacted fracture of distal radial metaphysis. Original Report Authenticated By: Lahoma Crocker, M.D.  Dg Hand Complete Right  Result Date: 04/19/2012 *RADIOLOGY REPORT* Clinical Data: Fall RIGHT HAND - COMPLETE 3+ VIEW Comparison: None. Findings: There is near complete anterior subluxation of the base of the proximal phalanx of the index finger with respect to the head of the second metacarpal.  The dorsal aspect of the base of the proximal phalanx is perched upon the palmar aspect of the head. There is also slight subluxation of the base of the third proximal phalanx with respect to the head of the third metacarpal.  No acute fracture and no dislocation. Degenerative change involving the second metacarpal phalangeal joint.  Severe degenerative changes at the base of the first metacarpal. Central erosions involving the DIP joints of the long finger and ring finger are noted. IMPRESSION: Near complete dislocation of the second metacarpal phalangeal joint.  Partial subluxation of the third metacarpal phalangeal joint. Chronic changes. Original Report Authenticated By: Jamas Lav, M.D.   Assessment & Plan:   Diagnoses and all orders for this visit:  Encounter for immunization -     Flu vaccine HIGH DOSE PF  Other orders -     amLODipine (NORVASC) 5 MG tablet; Take 1 tablet (5 mg total) by mouth daily. -     levothyroxine (SYNTHROID, LEVOTHROID) 100 MCG tablet; Take 0.5 tablets (50 mcg total) by mouth daily. -     losartan (COZAAR) 100 MG tablet; Take 1 tablet (100 mg total)  by mouth daily. -     memantine (NAMENDA XR) 28 MG CP24 24 hr capsule; Take 1 capsule (28 mg total) by mouth daily.   I am having Lisa [name] maintain her Vitamin D3, cyanocobalamin, travoprost (benzalkonium), losartan, memantine, amLODipine, and levothyroxine.  No orders of the defined types were placed in this encounter.    Follow-up: No Follow-up on file.  Walker Kehr, MD

## 2016-09-02 NOTE — Patient Instructions (Signed)
Preventive Care for Adults, Female A healthy lifestyle and preventive care can promote health and wellness. Preventive health guidelines for women include the following key practices.  A routine yearly physical is a good way to check with your health care provider about your health and preventive screening. It is a chance to share any concerns and updates on your health and to receive a thorough exam.  Visit your dentist for a routine exam and preventive care every 6 months. Brush your teeth twice a day and floss once a day. Good oral hygiene prevents tooth decay and gum disease.  The frequency of eye exams is based on your age, health, family medical history, use of contact lenses, and other factors. Follow your health care provider's recommendations for frequency of eye exams.  Eat a healthy diet. Foods like vegetables, fruits, whole grains, low-fat dairy products, and lean protein foods contain the nutrients you need without too many calories. Decrease your intake of foods high in solid fats, added sugars, and salt. Eat the right amount of calories for you.Get information about a proper diet from your health care provider, if necessary.  Regular physical exercise is one of the most important things you can do for your health. Most adults should get at least 150 minutes of moderate-intensity exercise (any activity that increases your heart rate and causes you to sweat) each week. In addition, most adults need muscle-strengthening exercises on 2 or more days a week.  Maintain a healthy weight. The body mass index (BMI) is a screening tool to identify possible weight problems. It provides an estimate of body fat based on height and weight. Your health care provider can find your BMI and can help you achieve or maintain a healthy weight.For adults 20 years and older:  A BMI below 18.5 is considered underweight.  A BMI of 18.5 to 24.9 is normal.  A BMI of 25 to 29.9 is considered overweight.  A  BMI of 30 and above is considered obese.  Maintain normal blood lipids and cholesterol levels by exercising and minimizing your intake of saturated fat. Eat a balanced diet with plenty of fruit and vegetables. Blood tests for lipids and cholesterol should begin at age 67 and be repeated every 5 years. If your lipid or cholesterol levels are high, you are over 50, or you are at high risk for heart disease, you may need your cholesterol levels checked more frequently.Ongoing high lipid and cholesterol levels should be treated with medicines if diet and exercise are not working.  If you smoke, find out from your health care provider how to quit. If you do not use tobacco, do not start.  Lung cancer screening is recommended for adults aged 7-80 years who are at high risk for developing lung cancer because of a history of smoking. A yearly low-dose CT scan of the lungs is recommended for people who have at least a 30-pack-year history of smoking and are a current smoker or have quit within the past 15 years. A pack year of smoking is smoking an average of 1 pack of cigarettes a day for 1 year (for example: 1 pack a day for 30 years or 2 packs a day for 15 years). Yearly screening should continue until the smoker has stopped smoking for at least 15 years. Yearly screening should be stopped for people who develop a health problem that would prevent them from having lung cancer treatment.  If you are pregnant, do not drink alcohol. If you are  breastfeeding, be very cautious about drinking alcohol. If you are not pregnant and choose to drink alcohol, do not have more than 1 drink per day. One drink is considered to be 12 ounces (355 mL) of beer, 5 ounces (148 mL) of wine, or 1.5 ounces (44 mL) of liquor.  Avoid use of street drugs. Do not share needles with anyone. Ask for help if you need support or instructions about stopping the use of drugs.  High blood pressure causes heart disease and increases the risk  of stroke. Your blood pressure should be checked at least every 1 to 2 years. Ongoing high blood pressure should be treated with medicines if weight loss and exercise do not work.  If you are 55-79 years old, ask your health care provider if you should take aspirin to prevent strokes.  Diabetes screening is done by taking a blood sample to check your blood glucose level after you have not eaten for a certain period of time (fasting). If you are not overweight and you do not have risk factors for diabetes, you should be screened once every 3 years starting at age 45. If you are overweight or obese and you are 40-70 years of age, you should be screened for diabetes every year as part of your cardiovascular risk assessment.  Breast cancer screening is essential preventive care for women. You should practice "breast self-awareness." This means understanding the normal appearance and feel of your breasts and may include breast self-examination. Any changes detected, no matter how small, should be reported to a health care provider. Women in their 20s and 30s should have a clinical breast exam (CBE) by a health care provider as part of a regular health exam every 1 to 3 years. After age 40, women should have a CBE every year. Starting at age 40, women should consider having a mammogram (breast X-ray test) every year. Women who have a family history of breast cancer should talk to their health care provider about genetic screening. Women at a high risk of breast cancer should talk to their health care providers about having an MRI and a mammogram every year.  Breast cancer gene (BRCA)-related cancer risk assessment is recommended for women who have family members with BRCA-related cancers. BRCA-related cancers include breast, ovarian, tubal, and peritoneal cancers. Having family members with these cancers may be associated with an increased risk for harmful changes (mutations) in the breast cancer genes BRCA1 and  BRCA2. Results of the assessment will determine the need for genetic counseling and BRCA1 and BRCA2 testing.  Your health care provider may recommend that you be screened regularly for cancer of the pelvic organs (ovaries, uterus, and vagina). This screening involves a pelvic examination, including checking for microscopic changes to the surface of your cervix (Pap test). You may be encouraged to have this screening done every 3 years, beginning at age 21.  For women ages 30-65, health care providers may recommend pelvic exams and Pap testing every 3 years, or they may recommend the Pap and pelvic exam, combined with testing for human papilloma virus (HPV), every 5 years. Some types of HPV increase your risk of cervical cancer. Testing for HPV may also be done on women of any age with unclear Pap test results.  Other health care providers may not recommend any screening for nonpregnant women who are considered low risk for pelvic cancer and who do not have symptoms. Ask your health care provider if a screening pelvic exam is right for   you.  If you have had past treatment for cervical cancer or a condition that could lead to cancer, you need Pap tests and screening for cancer for at least 20 years after your treatment. If Pap tests have been discontinued, your risk factors (such as having a new sexual partner) need to be reassessed to determine if screening should resume. Some women have medical problems that increase the chance of getting cervical cancer. In these cases, your health care provider may recommend more frequent screening and Pap tests.  Colorectal cancer can be detected and often prevented. Most routine colorectal cancer screening begins at the age of 37 years and continues through age 1 years. However, your health care provider may recommend screening at an earlier age if you have risk factors for colon cancer. On a yearly basis, your health care provider may provide home test kits to check  for hidden blood in the stool. Use of a small camera at the end of a tube, to directly examine the colon (sigmoidoscopy or colonoscopy), can detect the earliest forms of colorectal cancer. Talk to your health care provider about this at age 43, when routine screening begins. Direct exam of the colon should be repeated every 5-10 years through age 35 years, unless early forms of precancerous polyps or small growths are found.  People who are at an increased risk for hepatitis B should be screened for this virus. You are considered at high risk for hepatitis B if:  You were born in a country where hepatitis B occurs often. Talk with your health care provider about which countries are considered high risk.  Your parents were born in a high-risk country and you have not received a shot to protect against hepatitis B (hepatitis B vaccine).  You have HIV or AIDS.  You use needles to inject street drugs.  You live with, or have sex with, someone who has hepatitis B.  You get hemodialysis treatment.  You take certain medicines for conditions like cancer, organ transplantation, and autoimmune conditions.  Hepatitis C blood testing is recommended for all people born from [birth year] through 1965 and any individual with known risks for hepatitis C.  Practice safe sex. Use condoms and avoid high-risk sexual practices to reduce the spread of sexually transmitted infections (STIs). STIs include gonorrhea, chlamydia, syphilis, trichomonas, herpes, HPV, and human immunodeficiency virus (HIV). Herpes, HIV, and HPV are viral illnesses that have no cure. They can result in disability, cancer, and death.  You should be screened for sexually transmitted illnesses (STIs) including gonorrhea and chlamydia if:  You are sexually active and are younger than 24 years.  You are older than 24 years and your health care provider tells you that you are at risk for this type of infection.  Your sexual activity has changed  since you were last screened and you are at an increased risk for chlamydia or gonorrhea. Ask your health care provider if you are at risk.  If you are at risk of being infected with HIV, it is recommended that you take a prescription medicine daily to prevent HIV infection. This is called preexposure prophylaxis (PrEP). You are considered at risk if:  You are sexually active and do not regularly use condoms or know the HIV status of your partner(s).  You take drugs by injection.  You are sexually active with a partner who has HIV.  Talk with your health care provider about whether you are at high risk of being infected with HIV. If  you choose to begin PrEP, you should first be tested for HIV. You should then be tested every 3 months for as long as you are taking PrEP.  Osteoporosis is a disease in which the bones lose minerals and strength with aging. This can result in serious bone fractures or breaks. The risk of osteoporosis can be identified using a bone density scan. Women ages 67 years and over and women at risk for fractures or osteoporosis should discuss screening with their health care providers. Ask your health care provider whether you should take a calcium supplement or vitamin D to reduce the rate of osteoporosis.  Menopause can be associated with physical symptoms and risks. Hormone replacement therapy is available to decrease symptoms and risks. You should talk to your health care provider about whether hormone replacement therapy is right for you.  Use sunscreen. Apply sunscreen liberally and repeatedly throughout the day. You should seek shade when your shadow is shorter than you. Protect yourself by wearing long sleeves, pants, a wide-brimmed hat, and sunglasses year round, whenever you are outdoors.  Once a month, do a whole body skin exam, using a mirror to look at the skin on your back. Tell your health care provider of new moles, moles that have irregular borders, moles that  are larger than a pencil eraser, or moles that have changed in shape or color.  Stay current with required vaccines (immunizations).  Influenza vaccine. All adults should be immunized every year.  Tetanus, diphtheria, and acellular pertussis (Td, Tdap) vaccine. Pregnant women should receive 1 dose of Tdap vaccine during each pregnancy. The dose should be obtained regardless of the length of time since the last dose. Immunization is preferred during the 27th-36th week of gestation. An adult who has not previously received Tdap or who does not know her vaccine status should receive 1 dose of Tdap. This initial dose should be followed by tetanus and diphtheria toxoids (Td) booster doses every 10 years. Adults with an unknown or incomplete history of completing a 3-dose immunization series with Td-containing vaccines should begin or complete a primary immunization series including a Tdap dose. Adults should receive a Td booster every 10 years.  Varicella vaccine. An adult without evidence of immunity to varicella should receive 2 doses or a second dose if she has previously received 1 dose. Pregnant females who do not have evidence of immunity should receive the first dose after pregnancy. This first dose should be obtained before leaving the health care facility. The second dose should be obtained 4-8 weeks after the first dose.  Human papillomavirus (HPV) vaccine. Females aged 13-26 years who have not received the vaccine previously should obtain the 3-dose series. The vaccine is not recommended for use in pregnant females. However, pregnancy testing is not needed before receiving a dose. If a female is found to be pregnant after receiving a dose, no treatment is needed. In that case, the remaining doses should be delayed until after the pregnancy. Immunization is recommended for any person with an immunocompromised condition through the age of 61 years if she did not get any or all doses earlier. During the  3-dose series, the second dose should be obtained 4-8 weeks after the first dose. The third dose should be obtained 24 weeks after the first dose and 16 weeks after the second dose.  Zoster vaccine. One dose is recommended for adults aged 30 years or older unless certain conditions are present.  Measles, mumps, and rubella (MMR) vaccine. Adults born  before 1957 generally are considered immune to measles and mumps. Adults born in [birth year] or later should have 1 or more doses of MMR vaccine unless there is a contraindication to the vaccine or there is laboratory evidence of immunity to each of the three diseases. A routine second dose of MMR vaccine should be obtained at least 28 days after the first dose for students attending postsecondary schools, health care workers, or international travelers. People who received inactivated measles vaccine or an unknown type of measles vaccine during 1963-1967 should receive 2 doses of MMR vaccine. People who received inactivated mumps vaccine or an unknown type of mumps vaccine before 1979 and are at high risk for mumps infection should consider immunization with 2 doses of MMR vaccine. For females of childbearing age, rubella immunity should be determined. If there is no evidence of immunity, females who are not pregnant should be vaccinated. If there is no evidence of immunity, females who are pregnant should delay immunization until after pregnancy. Unvaccinated health care workers born before [birth year] who lack laboratory evidence of measles, mumps, or rubella immunity or laboratory confirmation of disease should consider measles and mumps immunization with 2 doses of MMR vaccine or rubella immunization with 1 dose of MMR vaccine.  Pneumococcal 13-valent conjugate (PCV13) vaccine. When indicated, a person who is uncertain of his immunization history and has no record of immunization should receive the PCV13 vaccine. All adults 36 years of age and older should receive this  vaccine. An adult aged 59 years or older who has certain medical conditions and has not been previously immunized should receive 1 dose of PCV13 vaccine. This PCV13 should be followed with a dose of pneumococcal polysaccharide (PPSV23) vaccine. Adults who are at high risk for pneumococcal disease should obtain the PPSV23 vaccine at least 8 weeks after the dose of PCV13 vaccine. Adults older than 80 years of age who have normal immune system function should obtain the PPSV23 vaccine dose at least 1 year after the dose of PCV13 vaccine.  Pneumococcal polysaccharide (PPSV23) vaccine. When PCV13 is also indicated, PCV13 should be obtained first. All adults aged 65 years and older should be immunized. An adult younger than age 59 years who has certain medical conditions should be immunized. Any person who resides in a nursing home or long-term care facility should be immunized. An adult smoker should be immunized. People with an immunocompromised condition and certain other conditions should receive both PCV13 and PPSV23 vaccines. People with human immunodeficiency virus (HIV) infection should be immunized as soon as possible after diagnosis. Immunization during chemotherapy or radiation therapy should be avoided. Routine use of PPSV23 vaccine is not recommended for American Indians, [place] Natives, or people younger than 65 years unless there are medical conditions that require PPSV23 vaccine. When indicated, people who have unknown immunization and have no record of immunization should receive PPSV23 vaccine. One-time revaccination 5 years after the first dose of PPSV23 is recommended for people aged 19-64 years who have chronic kidney failure, nephrotic syndrome, asplenia, or immunocompromised conditions. People who received 1-2 doses of PPSV23 before age 55 years should receive another dose of PPSV23 vaccine at age 10 years or later if at least 5 years have passed since the previous dose. Doses of PPSV23 are not  needed for people immunized with PPSV23 at or after age 50 years.  Meningococcal vaccine. Adults with asplenia or persistent complement component deficiencies should receive 2 doses of quadrivalent meningococcal conjugate (MenACWY-D) vaccine. The doses should be obtained  at least 2 months apart. Microbiologists working with certain meningococcal bacteria, [place] recruits, people at risk during an outbreak, and people who travel to or live in countries with a high rate of meningitis should be immunized. A first-year college student up through age 34 years who is living in a residence hall should receive a dose if she did not receive a dose on or after her 16th birthday. Adults who have certain high-risk conditions should receive one or more doses of vaccine.  Hepatitis A vaccine. Adults who wish to be protected from this disease, have certain high-risk conditions, work with hepatitis A-infected animals, work in hepatitis A research labs, or travel to or work in countries with a high rate of hepatitis A should be immunized. Adults who were previously unvaccinated and who anticipate close contact with an international adoptee during the first 60 days after arrival in the [place] from a country with a high rate of hepatitis A should be immunized.  Hepatitis B vaccine. Adults who wish to be protected from this disease, have certain high-risk conditions, may be exposed to blood or other infectious body fluids, are household contacts or sex partners of hepatitis B positive people, are clients or workers in certain care facilities, or travel to or work in countries with a high rate of hepatitis B should be immunized.  Haemophilus influenzae type b (Hib) vaccine. A previously unvaccinated person with asplenia or sickle cell disease or having a scheduled splenectomy should receive 1 dose of Hib vaccine. Regardless of previous immunization, a recipient of a hematopoietic stem cell transplant should receive a  3-dose series 6-12 months after her successful transplant. Hib vaccine is not recommended for adults with HIV infection. Preventive Services / Frequency Ages 35 to 4 years  Blood pressure check.** / Every 3-5 years.  Lipid and cholesterol check.** / Every 5 years beginning at age 60.  Clinical breast exam.** / Every 3 years for women in their 71s and 10s.  BRCA-related cancer risk assessment.** / For women who have family members with a BRCA-related cancer (breast, ovarian, tubal, or peritoneal cancers).  Pap test.** / Every 2 years from ages 76 through 26. Every 3 years starting at age 61 through age 76 or 93 with a history of 3 consecutive normal Pap tests.  HPV screening.** / Every 3 years from ages 37 through ages 60 to 51 with a history of 3 consecutive normal Pap tests.  Hepatitis C blood test.** / For any individual with known risks for hepatitis C.  Skin self-exam. / Monthly.  Influenza vaccine. / Every year.  Tetanus, diphtheria, and acellular pertussis (Tdap, Td) vaccine.** / Consult your health care provider. Pregnant women should receive 1 dose of Tdap vaccine during each pregnancy. 1 dose of Td every 10 years.  Varicella vaccine.** / Consult your health care provider. Pregnant females who do not have evidence of immunity should receive the first dose after pregnancy.  HPV vaccine. / 3 doses over 6 months, if 93 and younger. The vaccine is not recommended for use in pregnant females. However, pregnancy testing is not needed before receiving a dose.  Measles, mumps, rubella (MMR) vaccine.** / You need at least 1 dose of MMR if you were born in 1957 or later. You may also need a 2nd dose. For females of childbearing age, rubella immunity should be determined. If there is no evidence of immunity, females who are not pregnant should be vaccinated. If there is no evidence of immunity, females who are  pregnant should delay immunization until after pregnancy.  Pneumococcal  13-valent conjugate (PCV13) vaccine.** / Consult your health care provider.  Pneumococcal polysaccharide (PPSV23) vaccine.** / 1 to 2 doses if you smoke cigarettes or if you have certain conditions.  Meningococcal vaccine.** / 1 dose if you are age 68 to 8 years and a Market researcher living in a residence hall, or have one of several medical conditions, you need to get vaccinated against meningococcal disease. You may also need additional booster doses.  Hepatitis A vaccine.** / Consult your health care provider.  Hepatitis B vaccine.** / Consult your health care provider.  Haemophilus influenzae type b (Hib) vaccine.** / Consult your health care provider. Ages 7 to 53 years  Blood pressure check.** / Every year.  Lipid and cholesterol check.** / Every 5 years beginning at age 25 years.  Lung cancer screening. / Every year if you are aged 11-80 years and have a 30-pack-year history of smoking and currently smoke or have quit within the past 15 years. Yearly screening is stopped once you have quit smoking for at least 15 years or develop a health problem that would prevent you from having lung cancer treatment.  Clinical breast exam.** / Every year after age 48 years.  BRCA-related cancer risk assessment.** / For women who have family members with a BRCA-related cancer (breast, ovarian, tubal, or peritoneal cancers).  Mammogram.** / Every year beginning at age 41 years and continuing for as long as you are in good health. Consult with your health care provider.  Pap test.** / Every 3 years starting at age 65 years through age 37 or 70 years with a history of 3 consecutive normal Pap tests.  HPV screening.** / Every 3 years from ages 72 years through ages 60 to 40 years with a history of 3 consecutive normal Pap tests.  Fecal occult blood test (FOBT) of stool. / Every year beginning at age 21 years and continuing until age 5 years. You may not need to do this test if you get  a colonoscopy every 10 years.  Flexible sigmoidoscopy or colonoscopy.** / Every 5 years for a flexible sigmoidoscopy or every 10 years for a colonoscopy beginning at age 35 years and continuing until age 48 years.  Hepatitis C blood test.** / For all people born from [birth year] through 1963 and any individual with known risks for hepatitis C.  Skin self-exam. / Monthly.  Influenza vaccine. / Every year.  Tetanus, diphtheria, and acellular pertussis (Tdap/Td) vaccine.** / Consult your health care provider. Pregnant women should receive 1 dose of Tdap vaccine during each pregnancy. 1 dose of Td every 10 years.  Varicella vaccine.** / Consult your health care provider. Pregnant females who do not have evidence of immunity should receive the first dose after pregnancy.  Zoster vaccine.** / 1 dose for adults aged 30 years or older.  Measles, mumps, rubella (MMR) vaccine.** / You need at least 1 dose of MMR if you were born in 1957 or later. You may also need a second dose. For females of childbearing age, rubella immunity should be determined. If there is no evidence of immunity, females who are not pregnant should be vaccinated. If there is no evidence of immunity, females who are pregnant should delay immunization until after pregnancy.  Pneumococcal 13-valent conjugate (PCV13) vaccine.** / Consult your health care provider.  Pneumococcal polysaccharide (PPSV23) vaccine.** / 1 to 2 doses if you smoke cigarettes or if you have certain conditions.  Meningococcal vaccine.** /  Consult your health care provider.  Hepatitis A vaccine.** / Consult your health care provider.  Hepatitis B vaccine.** / Consult your health care provider.  Haemophilus influenzae type b (Hib) vaccine.** / Consult your health care provider. Ages 64 years and over  Blood pressure check.** / Every year.  Lipid and cholesterol check.** / Every 5 years beginning at age 23 years.  Lung cancer screening. / Every year if you  are aged 16-80 years and have a 30-pack-year history of smoking and currently smoke or have quit within the past 15 years. Yearly screening is stopped once you have quit smoking for at least 15 years or develop a health problem that would prevent you from having lung cancer treatment.  Clinical breast exam.** / Every year after age 74 years.  BRCA-related cancer risk assessment.** / For women who have family members with a BRCA-related cancer (breast, ovarian, tubal, or peritoneal cancers).  Mammogram.** / Every year beginning at age 44 years and continuing for as long as you are in good health. Consult with your health care provider.  Pap test.** / Every 3 years starting at age 58 years through age 22 or 39 years with 3 consecutive normal Pap tests. Testing can be stopped between 65 and 70 years with 3 consecutive normal Pap tests and no abnormal Pap or HPV tests in the past 10 years.  HPV screening.** / Every 3 years from ages 64 years through ages 70 or 61 years with a history of 3 consecutive normal Pap tests. Testing can be stopped between 65 and 70 years with 3 consecutive normal Pap tests and no abnormal Pap or HPV tests in the past 10 years.  Fecal occult blood test (FOBT) of stool. / Every year beginning at age 40 years and continuing until age 27 years. You may not need to do this test if you get a colonoscopy every 10 years.  Flexible sigmoidoscopy or colonoscopy.** / Every 5 years for a flexible sigmoidoscopy or every 10 years for a colonoscopy beginning at age 7 years and continuing until age 32 years.  Hepatitis C blood test.** / For all people born from [birth year] through 1965 and any individual with known risks for hepatitis C.  Osteoporosis screening.** / A one-time screening for women ages 30 years and over and women at risk for fractures or osteoporosis.  Skin self-exam. / Monthly.  Influenza vaccine. / Every year.  Tetanus, diphtheria, and acellular pertussis (Tdap/Td)  vaccine.** / 1 dose of Td every 10 years.  Varicella vaccine.** / Consult your health care provider.  Zoster vaccine.** / 1 dose for adults aged 35 years or older.  Pneumococcal 13-valent conjugate (PCV13) vaccine.** / Consult your health care provider.  Pneumococcal polysaccharide (PPSV23) vaccine.** / 1 dose for all adults aged 46 years and older.  Meningococcal vaccine.** / Consult your health care provider.  Hepatitis A vaccine.** / Consult your health care provider.  Hepatitis B vaccine.** / Consult your health care provider.  Haemophilus influenzae type b (Hib) vaccine.** / Consult your health care provider. ** Family history and personal history of risk and conditions may change your health care provider's recommendations.   This information is not intended to replace advice given to you by your health care provider. Make sure you discuss any questions you have with your health care provider.   Document Released: 01/27/2002 Document Revised: 12/22/2014 Document Reviewed: 04/28/2011 Elsevier Interactive Patient Education [employer].

## 2016-09-02 NOTE — Assessment & Plan Note (Signed)
Amlodipine.

## 2016-09-02 NOTE — Assessment & Plan Note (Signed)
Worse Namenda XR Vayacog discussed - declined

## 2016-09-02 NOTE — Assessment & Plan Note (Addendum)
Here for medicare wellness/physical  Diet: heart healthy  Physical activity: sedentary  Depression/mood screen: depressed Hearing: decreased to whispered voice  Visual acuity: decreased, performs annual eye exam  ADLs: not capable  Fall risk: moderate  Home safety: good  Cognitive evaluation: intact to orientation, naming, recall and repetition  EOL planning: adv directives, full code/ I agree  I have personally reviewed and have noted  1. The patient's medical and social history  2. Their use of alcohol, tobacco or illicit drugs  3. Their current medications and supplements  4. The patient's functional ability including ADL's, fall risks, home safety risks and hearing or visual impairment - decreased.  5. Diet and physical activities  6. Evidence for depression or mood disorders    Today patient counseled on age appropriate routine health concerns for screening and prevention, each reviewed and up to date or declined. Immunizations reviewed and up to date or declined. Labs ordered and reviewed. Risk factors for depression reviewed and negative. Hearing function and visual acuity are intact. ADLs screened and addressed as needed. Functional ability and level of safety reviewed and appropriate. Education, counseling and referrals performed based on assessed risks today. Patient provided with a copy of personalized plan for preventive services.

## 2016-09-02 NOTE — Progress Notes (Signed)
Pre visit review using our clinic review tool, if applicable. No additional management support is needed unless otherwise documented below in the visit note. 

## 2016-09-04 ENCOUNTER — Other Ambulatory Visit: Payer: Self-pay | Admitting: Internal Medicine

## 2016-09-04 ENCOUNTER — Other Ambulatory Visit (INDEPENDENT_AMBULATORY_CARE_PROVIDER_SITE_OTHER): Payer: Medicare Other

## 2016-09-04 DIAGNOSIS — E039 Hypothyroidism, unspecified: Secondary | ICD-10-CM | POA: Diagnosis not present

## 2016-09-04 DIAGNOSIS — Z0189 Encounter for other specified special examinations: Secondary | ICD-10-CM

## 2016-09-04 DIAGNOSIS — E785 Hyperlipidemia, unspecified: Secondary | ICD-10-CM

## 2016-09-04 DIAGNOSIS — I10 Essential (primary) hypertension: Secondary | ICD-10-CM | POA: Diagnosis not present

## 2016-09-04 DIAGNOSIS — Z0000 Encounter for general adult medical examination without abnormal findings: Secondary | ICD-10-CM

## 2016-09-04 LAB — CBC WITH DIFFERENTIAL/PLATELET
BASOS PCT: 0.3 % (ref 0.0–3.0)
Basophils Absolute: 0 10*3/uL (ref 0.0–0.1)
EOS PCT: 3 % (ref 0.0–5.0)
Eosinophils Absolute: 0.2 10*3/uL (ref 0.0–0.7)
HEMATOCRIT: 38.4 % (ref 36.0–46.0)
Hemoglobin: 13.1 g/dL (ref 12.0–15.0)
LYMPHS ABS: 1.5 10*3/uL (ref 0.7–4.0)
Lymphocytes Relative: 28 % (ref 12.0–46.0)
MCHC: 34.2 g/dL (ref 30.0–36.0)
MCV: 87.5 fl (ref 78.0–100.0)
MONOS PCT: 9 % (ref 3.0–12.0)
Monocytes Absolute: 0.5 10*3/uL (ref 0.1–1.0)
NEUTROS ABS: 3.2 10*3/uL (ref 1.4–7.7)
Neutrophils Relative %: 59.7 % (ref 43.0–77.0)
PLATELETS: 309 10*3/uL (ref 150.0–400.0)
RBC: 4.38 Mil/uL (ref 3.87–5.11)
RDW: 13.9 % (ref 11.5–15.5)
WBC: 5.4 10*3/uL (ref 4.0–10.5)

## 2016-09-04 LAB — BASIC METABOLIC PANEL
BUN: 29 mg/dL — ABNORMAL HIGH (ref 6–23)
CALCIUM: 9.5 mg/dL (ref 8.4–10.5)
CHLORIDE: 102 meq/L (ref 96–112)
CO2: 30 meq/L (ref 19–32)
Creatinine, Ser: 0.89 mg/dL (ref 0.40–1.20)
GFR: 63.84 mL/min (ref >60.00)
GLUCOSE: 92 mg/dL (ref 70–99)
POTASSIUM: 3.9 meq/L (ref 3.5–5.1)
SODIUM: 139 meq/L (ref 135–145)

## 2016-09-04 LAB — URINALYSIS, ROUTINE W REFLEX MICROSCOPIC
BILIRUBIN URINE: NEGATIVE
KETONES UR: 15 — AB
NITRITE: POSITIVE — AB
Specific Gravity, Urine: 1.025 (ref 1.000–1.030)
Total Protein, Urine: 30 — AB
Urine Glucose: NEGATIVE
Urobilinogen, UA: 0.2 (ref 0.0–1.0)
pH: 5 (ref 5.0–8.0)

## 2016-09-04 LAB — LIPID PANEL
CHOLESTEROL: 236 mg/dL — AB (ref 0–200)
HDL: 63.2 mg/dL (ref >39.00)
LDL Cholesterol: 159 mg/dL — ABNORMAL HIGH (ref 0–99)
NonHDL: 172.35
TRIGLYCERIDES: 67 mg/dL (ref 0.0–149.0)
Total CHOL/HDL Ratio: 4
VLDL: 13.4 mg/dL (ref 0.0–40.0)

## 2016-09-04 LAB — HEPATIC FUNCTION PANEL
ALK PHOS: 90 U/L (ref 39–117)
ALT: 26 U/L (ref 0–35)
AST: 21 U/L (ref 0–37)
Albumin: 3.6 g/dL (ref 3.5–5.2)
BILIRUBIN DIRECT: 0 mg/dL (ref 0.0–0.3)
BILIRUBIN TOTAL: 0.5 mg/dL (ref 0.2–1.2)
Total Protein: 6.9 g/dL (ref 6.0–8.3)

## 2016-09-04 LAB — TSH: TSH: 0.07 u[IU]/mL — AB (ref 0.35–4.50)

## 2016-09-04 MED ORDER — CEFUROXIME AXETIL 250 MG PO TABS: 250.0000 mg | ORAL_TABLET | Freq: Two times a day (BID) | ORAL | 0 refills | Status: AC

## 2016-09-05 ENCOUNTER — Other Ambulatory Visit: Payer: Self-pay | Admitting: Internal Medicine

## 2016-09-10 ENCOUNTER — Telehealth: Payer: Self-pay | Admitting: Internal Medicine

## 2016-09-10 NOTE — Telephone Encounter (Signed)
MR&Mrs. Corra called about the labs results. They wanted to the mailed to them. Pharmacy did call to tell them about the rx. To pick up. I informed them of the notes. I could not get them to print for some reason. Can you please print&mail them. Thank you.

## 2016-09-11 DIAGNOSIS — H401113 Primary open-angle glaucoma, right eye, severe stage: Secondary | ICD-10-CM | POA: Diagnosis not present

## 2016-09-11 DIAGNOSIS — H401122 Primary open-angle glaucoma, left eye, moderate stage: Secondary | ICD-10-CM | POA: Diagnosis not present

## 2016-09-11 NOTE — Telephone Encounter (Signed)
Both patient's labs copied/mailed to home address.

## 2016-09-12 ENCOUNTER — Other Ambulatory Visit: Payer: Self-pay | Admitting: Internal Medicine

## 2016-09-30 DIAGNOSIS — H43811 Vitreous degeneration, right eye: Secondary | ICD-10-CM | POA: Diagnosis not present

## 2016-09-30 DIAGNOSIS — H353114 Nonexudative age-related macular degeneration, right eye, advanced atrophic with subfoveal involvement: Secondary | ICD-10-CM | POA: Diagnosis not present

## 2016-09-30 DIAGNOSIS — H353223 Exudative age-related macular degeneration, left eye, with inactive scar: Secondary | ICD-10-CM | POA: Diagnosis not present

## 2016-10-15 DIAGNOSIS — H353223 Exudative age-related macular degeneration, left eye, with inactive scar: Secondary | ICD-10-CM | POA: Diagnosis not present

## 2016-10-15 DIAGNOSIS — H353110 Nonexudative age-related macular degeneration, right eye, stage unspecified: Secondary | ICD-10-CM | POA: Diagnosis not present

## 2016-10-15 DIAGNOSIS — H401133 Primary open-angle glaucoma, bilateral, severe stage: Secondary | ICD-10-CM | POA: Diagnosis not present

## 2016-10-15 DIAGNOSIS — Z961 Presence of intraocular lens: Secondary | ICD-10-CM | POA: Diagnosis not present

## 2016-11-18 ENCOUNTER — Other Ambulatory Visit: Payer: Self-pay | Admitting: *Deleted

## 2016-11-18 MED ORDER — TRAVOPROST 0.004 % OP SOLN: 2.0000 [drp] | Freq: Every day | OPHTHALMIC | 11 refills | Status: DC

## 2016-11-19 ENCOUNTER — Telehealth: Payer: Self-pay | Admitting: *Deleted

## 2016-11-19 NOTE — Telephone Encounter (Signed)
The patient should contact her eye doctor for clarification. Thank you

## 2016-11-19 NOTE — Telephone Encounter (Signed)
Rec'd call from [facility] in [place] stating they received refill for pt Travatan drops. They do not make anymore has been change to Travatan Z. Wanting to know can they switch, and recommend dosage is only 1 drop in both eyes. Pls advise...[name]

## 2016-11-20 NOTE — Telephone Encounter (Signed)
Called pharmacist spoke w/Lisa [name] gave her MD response. Cancel script for Travatan pt to contact her eye doctor....[name]

## 2016-11-25 ENCOUNTER — Other Ambulatory Visit: Payer: Self-pay | Admitting: *Deleted

## 2017-01-21 DIAGNOSIS — H401133 Primary open-angle glaucoma, bilateral, severe stage: Secondary | ICD-10-CM | POA: Diagnosis not present

## 2017-01-21 DIAGNOSIS — H04123 Dry eye syndrome of bilateral lacrimal glands: Secondary | ICD-10-CM | POA: Diagnosis not present

## 2017-01-21 DIAGNOSIS — H353110 Nonexudative age-related macular degeneration, right eye, stage unspecified: Secondary | ICD-10-CM | POA: Diagnosis not present

## 2017-01-21 DIAGNOSIS — H43813 Vitreous degeneration, bilateral: Secondary | ICD-10-CM | POA: Diagnosis not present

## 2017-01-21 DIAGNOSIS — Z931 Gastrostomy status: Secondary | ICD-10-CM | POA: Diagnosis not present

## 2017-02-17 DIAGNOSIS — B351 Tinea unguium: Secondary | ICD-10-CM | POA: Diagnosis not present

## 2017-03-09 DIAGNOSIS — H16223 Keratoconjunctivitis sicca, not specified as Sjogren's, bilateral: Secondary | ICD-10-CM | POA: Diagnosis not present

## 2017-03-09 DIAGNOSIS — H04123 Dry eye syndrome of bilateral lacrimal glands: Secondary | ICD-10-CM | POA: Diagnosis not present

## 2017-03-23 DIAGNOSIS — H401132 Primary open-angle glaucoma, bilateral, moderate stage: Secondary | ICD-10-CM | POA: Diagnosis not present

## 2017-03-23 DIAGNOSIS — H16223 Keratoconjunctivitis sicca, not specified as Sjogren's, bilateral: Secondary | ICD-10-CM | POA: Diagnosis not present

## 2017-03-23 DIAGNOSIS — H04123 Dry eye syndrome of bilateral lacrimal glands: Secondary | ICD-10-CM | POA: Diagnosis not present

## 2017-05-05 DIAGNOSIS — H401122 Primary open-angle glaucoma, left eye, moderate stage: Secondary | ICD-10-CM | POA: Diagnosis not present

## 2017-05-05 DIAGNOSIS — H401113 Primary open-angle glaucoma, right eye, severe stage: Secondary | ICD-10-CM | POA: Diagnosis not present

## 2017-05-05 DIAGNOSIS — H353230 Exudative age-related macular degeneration, bilateral, stage unspecified: Secondary | ICD-10-CM | POA: Diagnosis not present

## 2017-05-05 DIAGNOSIS — H16423 Pannus (corneal), bilateral: Secondary | ICD-10-CM | POA: Diagnosis not present

## 2017-06-02 ENCOUNTER — Telehealth: Payer: Self-pay | Admitting: Internal Medicine

## 2017-06-02 NOTE — Telephone Encounter (Signed)
Notified pt daughter and husband that she will need an OV to get the X-ray and we cannot put in the order for a different office.

## 2017-06-02 NOTE — Telephone Encounter (Signed)
Pt daughter called and Pt fell last night and hurt her wrist, the wrist is very swollen and Pt cannot put any weight on it. They would like orders put in for an Xray at the [facility] office, they do not want to come all the way to [place]. Daughter was notified of Plots absence.

## 2017-06-03 ENCOUNTER — Encounter: Payer: Self-pay | Admitting: Family Medicine

## 2017-06-03 ENCOUNTER — Ambulatory Visit (INDEPENDENT_AMBULATORY_CARE_PROVIDER_SITE_OTHER)
Admission: RE | Admit: 2017-06-03 | Discharge: 2017-06-03 | Disposition: A | Discharge status: Home / Self Care | Payer: Medicare Other | Source: Ambulatory Visit | Attending: Family Medicine | Admitting: Family Medicine

## 2017-06-03 ENCOUNTER — Telehealth: Payer: Self-pay | Admitting: Internal Medicine

## 2017-06-03 ENCOUNTER — Telehealth: Payer: Self-pay

## 2017-06-03 ENCOUNTER — Ambulatory Visit (INDEPENDENT_AMBULATORY_CARE_PROVIDER_SITE_OTHER): Payer: Medicare Other | Admitting: Family Medicine

## 2017-06-03 VITALS — BP 148/60 | HR 85 | Temp 98.0°F | Ht 66.0 in | Wt 131.5 lb

## 2017-06-03 DIAGNOSIS — S6992XA Unspecified injury of left wrist, hand and finger(s), initial encounter: Secondary | ICD-10-CM

## 2017-06-03 DIAGNOSIS — S52502A Unspecified fracture of the lower end of left radius, initial encounter for closed fracture: Secondary | ICD-10-CM | POA: Diagnosis not present

## 2017-06-03 NOTE — Telephone Encounter (Signed)
Stacy with [facility] radiology called report pt has mildly impacted distal radial fx. Printed report taken to Dr Marliss Coots area; report is in epic.

## 2017-06-03 NOTE — Telephone Encounter (Signed)
Spouse notified of xray results and Dr. Marliss Coots instructions and verbalized understanding

## 2017-06-03 NOTE — Assessment & Plan Note (Signed)
See assessment for wrist injury Ortho ref made

## 2017-06-03 NOTE — Assessment & Plan Note (Addendum)
Fall occ wed night followed by pain and swelling the next day  Some ecchymosis  Some tenderness over medial wrist and base of thumb XR- mildly impacted distal radius fracture  Given wrist splint  Adv ice/elevate and remove ring if able  Ref to orthopedics

## 2017-06-03 NOTE — Patient Instructions (Signed)
Wear the wrist brace except when bathing or icing  Use cold compress for 10 minutes whenever possible  Tylenol for pain every 4-6 hours  Elevate hand whenever you can as well   We will update you with radiology reading and further plan    Please alert [place] for any issues or questions

## 2017-06-03 NOTE — Telephone Encounter (Signed)
Please let pt's husband know they did see a mildly impacted distal radius fracture (that is the side above the thumb) on xray  I will do a ref to ortho and send it to [facility] to get that going

## 2017-06-03 NOTE — Progress Notes (Signed)
Subjective:    Patient ID: Lisa [name], female    DOB: September 04, 1928, 83 y.o.   MRN: [id number]  HPI Here for L wrist injury  83 yo pt of Dr Alain Marion  tues  night-tripped coming back from the bathroom  Her husband was able to get her up  Perhaps tried to catch herself with L hand  She did not c/o of pain   Very swollen the next am  Hurts  Using ice  Gave her tylenol this am  Not complaining about it  Is R hand dominant  Patient Active Problem List   Diagnosis Date Noted  . Left wrist injury, initial encounter 06/03/2017  . Depression 09/02/2016  . Well adult exam 08/31/2014  . Fatigue 07/22/2011  . Syncopal episodes 06/25/2011  . Hyponatremia 06/25/2011  . Alzheimer disease 06/25/2011  . DIARRHEA 08/01/2008  . PERIPHERAL VASCULAR DISEASE 07/11/2008  . NEOPLASM OF UNCERTAIN BEHAVIOR OF SKIN 05/23/2008  . OTHER NONTHROMBOCYTOPENIC PURPURAS 05/23/2008  . MYALGIA 05/23/2008  . Hypothyroidism 11/23/2007  . HYPERLIPIDEMIA 11/23/2007  . Essential hypertension 11/23/2007  . OSTEOARTHRITIS 11/23/2007  . LOW BACK PAIN 11/23/2007   Past Medical History:  Diagnosis Date  . Glaucoma   . HYPERLIPIDEMIA 11/23/2007  . HYPERTENSION 11/23/2007  . HYPOTHYROIDISM 11/23/2007  . LOW BACK PAIN 11/23/2007  . Macular degeneration   . OSTEOARTHRITIS 11/23/2007  . PERIPHERAL VASCULAR DISEASE 07/11/2008   Past Surgical History:  Procedure Laterality Date  . LUMBAR FUSION    . TOTAL KNEE ARTHROPLASTY     Social History  Substance Use Topics  . Smoking status: Never Smoker  . Smokeless tobacco: Never Used  . Alcohol use No   Family History  Problem Relation Age of Onset  . Hyperlipidemia Mother   . Diabetes Father   . Hypertension Other    Allergies  Allergen Reactions  . Aricept [Donepezil Hydrochloride]     syncope   Current Outpatient Prescriptions on File Prior to Visit  Medication Sig Dispense Refill  . amLODipine (NORVASC) 5 MG tablet Take 1 tablet (5 mg total) by mouth  daily. 90 tablet 3  . Cholecalciferol (VITAMIN D3) 1000 UNITS CAPS Take 1 capsule by mouth daily. 100 capsule 3  . cyanocobalamin 1000 MCG tablet Take 100 mcg by mouth daily.    Marland Kitchen escitalopram (LEXAPRO) 5 MG tablet Take 1 tablet (5 mg total) by mouth daily. 90 tablet 3  . levothyroxine (SYNTHROID, LEVOTHROID) 100 MCG tablet Take 0.5 tablets (50 mcg total) by mouth daily. 90 tablet 3  . losartan (COZAAR) 100 MG tablet Take 1 tablet (100 mg total) by mouth daily. 90 tablet 3  . losartan (COZAAR) 100 MG tablet TAKE 1 TABLET (100 MG TOTAL) BY MOUTH DAILY. 90 tablet 0  . memantine (NAMENDA XR) 28 MG CP24 24 hr capsule Take 1 capsule (28 mg total) by mouth daily. 90 capsule 3  . NAMENDA XR 28 MG CP24 24 hr capsule TAKE 1 CAPSULE (28 MG TOTAL) BY MOUTH DAILY. 90 capsule 1  . travoprost, benzalkonium, (TRAVATAN) 0.004 % ophthalmic solution Place 2 drops into both eyes daily. 2.5 mL 11   No current facility-administered medications on file prior to visit.     Review of Systems Review of Systems  Constitutional: Negative for fever, appetite change, fatigue and unexpected weight change.  Eyes: Negative for pain and visual disturbance. pos for baseline conj injection  Respiratory: Negative for cough and shortness of breath.   Cardiovascular: Negative for cp or palpitations  Gastrointestinal: Negative for nausea, diarrhea and constipation.  Genitourinary: Negative for urgency and frequency.  Skin: Negative for pallor or rash   MSK pos for L wrist/hand soreness and swelling  Neurological: Negative for weakness, light-headedness, numbness and headaches.  Hematological: Negative for adenopathy. Does not bruise/bleed easily.  Psychiatric/Behavioral: Negative for dysphoric mood. The patient is not nervous/anxious.         Objective:   Physical Exam  Constitutional:  Frail appearing elderly female  HENT:  Head: Normocephalic and atraumatic.  Eyes: EOM are normal. Pupils are equal, round, and  reactive to light.  inj conj bilat  Neck: Normal range of motion. Neck supple.  Musculoskeletal:       Left wrist: She exhibits decreased range of motion, tenderness, bony tenderness and swelling. She exhibits no effusion, no crepitus and no deformity.       Left hand: She exhibits swelling. She exhibits normal range of motion, no tenderness, normal capillary refill, no deformity and no laceration. Normal sensation noted.  Diffusely swollen L wrist and hand with some ecchymosis at base of thumb  Tender over distal ulna and proximal first MCP Nl rom fingers Pain with full ext and flex of wrist (worse in ext) Some discomfort with supination  No crepitus No deformity (though some oa changes diffusely) Nl perfusion and sensation   Ring on 4th finger- slides but cannot remove over her middle knuckle (per husband she never removes it)- no signs of vascular compromise due to this     Neurological: She is alert. She displays no atrophy. No cranial nerve deficit or sensory deficit. She exhibits normal muscle tone.  Grip in L hand is limited by pain   Skin: Skin is warm and dry.  Psychiatric:  Baseline dementia Pleasant and answers questions Husband gives hx           Assessment & Plan:   Problem List Items Addressed This Visit      Musculoskeletal and Integument   Distal radius fracture, left    See assessment for wrist injury Ortho ref made       Relevant Orders   Ambulatory referral to Orthopedic Surgery     Other   Left wrist injury, initial encounter    Fall occ wed night followed by pain and swelling the next day  Some ecchymosis  Some tenderness over medial wrist and base of thumb XR- mildly impacted distal radius fracture  Given wrist splint  Adv ice/elevate and remove ring if able  Ref to orthopedics       Relevant Orders   DG Wrist Complete Left (Completed)   DG Hand Complete Left (Completed)

## 2017-06-03 NOTE — Telephone Encounter (Signed)
Pt has appt with Dr Glori Bickers 06/03/17 at 12:30.

## 2017-06-03 NOTE — Telephone Encounter (Signed)
Patient Name: Lisa [name]  DOB: July 31, 1928    Initial Comment wife fell, sprained or broke her wrist, called to make appt with dr. Lorelei Pont, no appts, office sent call over to [place].    Nurse Assessment  Nurse: Zorita Pang, RN, Deborah Date/Time (Eastern Time): 06/03/2017 9:27:35 AM  Confirm and document reason for call. If symptomatic, describe symptoms. ---The caller states that his wife fell 2 days ago and hurt her left wrist. He states that it is swollen and is painful. He states that they have applied ice. No other injuries with the fall. He says that the swelling is moderate to severe in her wrist.  Does the patient have any new or worsening symptoms? ---Yes  Will a triage be completed? ---Yes  Related visit to physician within the last 2 weeks? ---No  Does the PT have any chronic conditions? (i.e. diabetes, asthma, etc.) ---Yes  List chronic conditions. ---dementia  Is this a behavioral health or substance abuse call? ---No     Guidelines    Guideline Title Affirmed Question Affirmed Notes  Hand and Wrist Injury Can't use injured hand normally (e.g., make a fist, open fully, hold a glass of water)    Final Disposition User   See Physician within 24 Hours Womble, RN, Neoma Laming    Comments  The caller states that he is almost 84 and needs to get an appointment close by and they can walk to [facility].  The caller states that if there is no appointment at [facility] that he wants to take her to the ER at [facility] due to his advanced age as that is not far from their house. Told caller that this nurse will call him back and he verbalized understanding.  Called the caller back and told him that a nurse is trying to get his wife an appointment and he states that he will only wait another 15 minutes. This nurse assured him that she will call him back in that time frame and he verbalizes understanding.   Referrals  REFERRED TO PCP OFFICE   Disagree/Comply: Comply

## 2017-06-05 DIAGNOSIS — S52522A Torus fracture of lower end of left radius, initial encounter for closed fracture: Secondary | ICD-10-CM | POA: Diagnosis not present

## 2017-06-16 DIAGNOSIS — H401122 Primary open-angle glaucoma, left eye, moderate stage: Secondary | ICD-10-CM | POA: Diagnosis not present

## 2017-06-16 DIAGNOSIS — H401113 Primary open-angle glaucoma, right eye, severe stage: Secondary | ICD-10-CM | POA: Diagnosis not present

## 2017-07-13 ENCOUNTER — Telehealth: Payer: Self-pay | Admitting: Internal Medicine

## 2017-07-13 NOTE — Telephone Encounter (Signed)
Daughter states the Lexapro is not working the effect that they were hoping. Patient is declining in health rapidly. She is lashing out at her kids and husband. They do have an aid that comes out a few times a week to help with things at the house. They would like to know what you would recommend about changing her Lexapro.

## 2017-07-13 NOTE — Telephone Encounter (Signed)
Pt daughter called in and needs to talk to nurse about changing a med.  They are looking to get pt in a home because it is getting hard to handle her.  She said that the meds are not working and getting more violent   Best number [phone]

## 2017-07-15 ENCOUNTER — Other Ambulatory Visit: Payer: Self-pay | Admitting: Internal Medicine

## 2017-07-15 MED ORDER — ESCITALOPRAM OXALATE 5 MG PO TABS: 10.0000 mg | ORAL_TABLET | Freq: Every day | ORAL | 3 refills | Status: DC

## 2017-07-15 NOTE — Telephone Encounter (Signed)
Pls start Lexapro 10 mg/d Let me know in 7-10 d if not better Thx

## 2017-07-16 NOTE — Telephone Encounter (Signed)
Daughter notified 

## 2017-07-28 ENCOUNTER — Telehealth: Payer: Self-pay | Admitting: Internal Medicine

## 2017-07-28 MED ORDER — ESCITALOPRAM OXALATE 5 MG PO TABS
10.0000 mg | ORAL_TABLET | Freq: Every day | ORAL | 5 refills | Status: DC
Start: 2017-07-28 — End: 2017-08-24

## 2017-07-28 NOTE — Telephone Encounter (Signed)
Per chart msg from 07/13/17 MD stated to start lexapro 10 mg (see below), he had enter script, but hit no print. Will resend script [facility] to [facility].../lmb     10:30 PM  Note    Pls start Lexapro 10 mg/d Let me know in 7-10 d if not better Thx

## 2017-07-28 NOTE — Telephone Encounter (Signed)
Pt daughter called in and said that they have checked the pharmacy twice for the escitalopram (LEXAPRO) 5 MG tablet [[id number]] and they do not have it.  It looks like it was sent on 8/1 but there is nothing there   [facility] [place]

## 2017-08-24 ENCOUNTER — Encounter: Payer: Self-pay | Admitting: Internal Medicine

## 2017-08-24 ENCOUNTER — Ambulatory Visit (INDEPENDENT_AMBULATORY_CARE_PROVIDER_SITE_OTHER): Payer: Medicare Other | Admitting: Internal Medicine

## 2017-08-24 VITALS — BP 136/78 | HR 73 | Temp 98.0°F | Ht 66.0 in | Wt 127.0 lb

## 2017-08-24 DIAGNOSIS — F0281 Dementia in other diseases classified elsewhere with behavioral disturbance: Secondary | ICD-10-CM

## 2017-08-24 DIAGNOSIS — G301 Alzheimer's disease with late onset: Secondary | ICD-10-CM | POA: Diagnosis not present

## 2017-08-24 DIAGNOSIS — Z111 Encounter for screening for respiratory tuberculosis: Secondary | ICD-10-CM | POA: Diagnosis not present

## 2017-08-24 DIAGNOSIS — I10 Essential (primary) hypertension: Secondary | ICD-10-CM | POA: Diagnosis not present

## 2017-08-24 DIAGNOSIS — Z23 Encounter for immunization: Secondary | ICD-10-CM

## 2017-08-24 DIAGNOSIS — F329 Major depressive disorder, single episode, unspecified: Secondary | ICD-10-CM | POA: Diagnosis not present

## 2017-08-24 MED ORDER — LOSARTAN POTASSIUM 100 MG PO TABS: 100.0000 mg | ORAL_TABLET | Freq: Every day | ORAL | 0 refills | Status: DC

## 2017-08-24 MED ORDER — ESCITALOPRAM OXALATE 10 MG PO TABS: 20.0000 mg | ORAL_TABLET | Freq: Every day | ORAL | 5 refills | Status: DC

## 2017-08-24 MED ORDER — LEVOTHYROXINE SODIUM 100 MCG PO TABS: 50.0000 ug | ORAL_TABLET | Freq: Every day | ORAL | 3 refills | Status: DC

## 2017-08-24 MED ORDER — MEMANTINE HCL ER 28 MG PO CP24: 28.0000 mg | ORAL_CAPSULE | Freq: Every day | ORAL | 3 refills | Status: DC

## 2017-08-24 MED ORDER — AMLODIPINE BESYLATE 5 MG PO TABS: 5.0000 mg | ORAL_TABLET | Freq: Every day | ORAL | 3 refills | Status: AC

## 2017-08-24 NOTE — Assessment & Plan Note (Signed)
Amlodipine.

## 2017-08-24 NOTE — Assessment & Plan Note (Addendum)
Namenda FL2 for Memory care unit admission PPD

## 2017-08-24 NOTE — Progress Notes (Signed)
Subjective:  Patient ID: Lisa [name], female    DOB: [birth year]  Age: 83 y.o. MRN: [id number]  CC: No chief complaint on file.   HPI Lisa [name] presents for dementia and anger issues Considering  Outpatient Medications Prior to Visit  Medication Sig Dispense Refill  . amLODipine (NORVASC) 5 MG tablet Take 1 tablet (5 mg total) by mouth daily. 90 tablet 3  . Cholecalciferol (VITAMIN D3) 1000 UNITS CAPS Take 1 capsule by mouth daily. 100 capsule 3  . cyanocobalamin 1000 MCG tablet Take 100 mcg by mouth daily.    Marland Kitchen escitalopram (LEXAPRO) 5 MG tablet Take 2 tablets (10 mg total) by mouth daily. 60 tablet 5  . levothyroxine (SYNTHROID, LEVOTHROID) 100 MCG tablet Take 0.5 tablets (50 mcg total) by mouth daily. 90 tablet 3  . losartan (COZAAR) 100 MG tablet TAKE 1 TABLET (100 MG TOTAL) BY MOUTH DAILY. 90 tablet 0  . memantine (NAMENDA XR) 28 MG CP24 24 hr capsule Take 1 capsule (28 mg total) by mouth daily. 90 capsule 3  . travoprost, benzalkonium, (TRAVATAN) 0.004 % ophthalmic solution Place 2 drops into both eyes daily. 2.5 mL 11  . losartan (COZAAR) 100 MG tablet Take 1 tablet (100 mg total) by mouth daily. 90 tablet 3  . NAMENDA XR 28 MG CP24 24 hr capsule TAKE 1 CAPSULE (28 MG TOTAL) BY MOUTH DAILY. 90 capsule 1   No facility-administered medications prior to visit.     ROS Review of Systems  Constitutional: Negative for activity change, appetite change, chills, fatigue and unexpected weight change.  HENT: Negative for congestion, mouth sores and sinus pressure.   Eyes: Positive for pain and redness. Negative for visual disturbance.  Respiratory: Negative for cough and chest tightness.   Gastrointestinal: Negative for abdominal pain and nausea.  Genitourinary: Negative for difficulty urinating, frequency and vaginal pain.  Musculoskeletal: Negative for back pain and gait problem.  Skin: Negative for pallor and rash.  Neurological: Negative for dizziness, tremors, weakness,  numbness and headaches.  Psychiatric/Behavioral: Positive for behavioral problems, confusion and decreased concentration. Negative for sleep disturbance and suicidal ideas. The patient is nervous/anxious.     Objective:  BP 136/78 (BP Location: Right Arm, Patient Position: Sitting, Cuff Size: Normal)   Pulse 73   Temp 98 F (36.7 C) (Oral)   Ht 5\' 6"  (1.676 m)   Wt 127 lb (57.6 kg)   SpO2 98%   BMI 20.50 kg/m   BP Readings from Last 3 Encounters:  08/24/17 136/78  06/03/17 (!) 148/60  09/02/16 132/60    Wt Readings from Last 3 Encounters:  08/24/17 127 lb (57.6 kg)  06/03/17 131 lb 8 oz (59.6 kg)  09/02/16 163 lb (73.9 kg)    Physical Exam  Constitutional: She appears well-developed. No distress.  HENT:  Head: Normocephalic.  Right Ear: External ear normal.  Left Ear: External ear normal.  Nose: Nose normal.  Mouth/Throat: Oropharynx is clear and moist.  Eyes: Pupils are equal, round, and reactive to light. Conjunctivae are normal. Right eye exhibits no discharge. Left eye exhibits no discharge.  Neck: Normal range of motion. Neck supple. No JVD present. No tracheal deviation present. No thyromegaly present.  Cardiovascular: Normal rate, regular rhythm and normal heart sounds.   Pulmonary/Chest: No stridor. No respiratory distress. She has no wheezes.  Abdominal: Soft. Bowel sounds are normal. She exhibits no distension and no mass. There is no tenderness. There is no rebound and no guarding.  Musculoskeletal: She exhibits no  edema or tenderness.  Lymphadenopathy:    She has no cervical adenopathy.  Neurological: She displays normal reflexes. No cranial nerve deficit. She exhibits normal muscle tone. Coordination abnormal.  Skin: No rash noted. No erythema.  Psychiatric: Her behavior is normal.  B eyes are red Confused  Lab Results  Component Value Date   WBC 5.4 09/04/2016   HGB 13.1 09/04/2016   HCT 38.4 09/04/2016   PLT 309.0 09/04/2016   GLUCOSE 92  09/04/2016   CHOL 236 (H) 09/04/2016   TRIG 67.0 09/04/2016   HDL 63.20 09/04/2016   LDLDIRECT 187.3 09/09/2013   LDLCALC 159 (H) 09/04/2016   ALT 26 09/04/2016   AST 21 09/04/2016   NA 139 09/04/2016   K 3.9 09/04/2016   CL 102 09/04/2016   CREATININE 0.89 09/04/2016   BUN 29 (H) 09/04/2016   CO2 30 09/04/2016   TSH 0.07 (L) 09/04/2016    Dg Wrist Complete Left  Result Date: 06/03/2017 CLINICAL DATA:  Fall 2 days ago with persistent wrist pain, initial encounter EXAM: LEFT WRIST - COMPLETE 3+ VIEW COMPARISON:  None. FINDINGS: Considerable soft tissue swelling is noted along the lateral aspect of the distal forearm and wrist. Mildly impacted distal radial fracture is noted within the metaphysis. No definitive ulnar fracture is seen. Significant carpal degenerative changes are noted. Degenerative changes of the first [facility] joint are noted as well. IMPRESSION: Distal radial metaphyseal fracture with impaction These results will be called to the ordering clinician or representative by the Radiologist Assistant, and communication documented in the PACS or zVision Dashboard. Electronically Signed   By: Lisa [name] M.D.   On: 06/03/2017 13:45   Dg Hand Complete Left  Result Date: 06/03/2017 CLINICAL DATA:  Recent fall with wrist pain and hand pain, initial encounter EXAM: LEFT HAND - COMPLETE 3+ VIEW COMPARISON:  None. FINDINGS: Distal radial fracture is noted in the metaphysis with impaction. No significant angulation is noted. No ulnar fracture is seen. Diffuse degenerative changes in the carpal bones as well as the carpometacarpal articulations are seen. Interphalangeal degenerative changes are noted as well. IMPRESSION: Diffuse degenerative changes. Mildly impacted distal radial fracture. These results will be called to the ordering clinician or representative by the Radiologist Assistant, and communication documented in the PACS or zVision Dashboard. Electronically Signed   By: Lisa [name] M.D.    On: 06/03/2017 13:47    Assessment & Plan:   There are no diagnoses linked to this encounter. I have discontinued [name]'s NAMENDA XR. I am also having her maintain her Vitamin D3, cyanocobalamin, amLODipine, levothyroxine, memantine, losartan, travoprost (benzalkonium), and escitalopram.  Meds ordered this encounter  Medications  . DISCONTD: travoprost, benzalkonium, (TRAVATAN) 0.004 % ophthalmic solution    Sig: travoprost 0.004 % eye drops  INSTILL 1 DROP INTO AFFECTED EYE(S) BY OPHTHALMIC ROUTE ONCE DAILY INTHE EVENING     Follow-up: No Follow-up on file.  Walker Kehr, MD

## 2017-08-24 NOTE — Assessment & Plan Note (Addendum)
Increase Lexapro to 20 mg/d if tolerated

## 2017-08-25 NOTE — Addendum Note (Signed)
Addended by: Karren Cobble on: 08/25/2017 09:21 AM   Modules accepted: Orders

## 2017-08-26 ENCOUNTER — Telehealth: Payer: Self-pay

## 2017-08-26 LAB — TB SKIN TEST
Induration: 0 mm
TB Skin Test: NEGATIVE

## 2017-08-26 NOTE — Telephone Encounter (Signed)
Lisa [name] pts daughter (not on the signed [facility] 06/03/17) left v/m that after pt had tb skin test read to fax the results to [facility] fax # [phone] and to notify Lisa [name] when done. The pt had already been to office and had TB skin test read and [name] CMA had given TB skin test results to pt. Unable to reach pt by phone. Lisa [name] called back and was advised pt had results; Lisa [name] wanted results faxed to [facility]; I advised we do not fax to a facility and Lisa [name] wanted to know if [facility] called [facility] could we give verbal results. I advised due to HIPPA guidelines we could not give verbal results. I told Lisa [name] that I was sorry for any inconvenience and she said I was not really sorry that she needed some cooperation. I advised I would speak with my office manager to see if I could do something differently. I was advised by Junie Panning my office manager that the results had been given to the pt and we could not fax or give verbal results and that for future use Lisa [name] should be added to [facility]. When I told Lisa [name] this she said she knows she was on DPR and asked my name which I gave her and she thanked me and hung up. FYI to [place] office mgr.

## 2017-08-28 DIAGNOSIS — H401133 Primary open-angle glaucoma, bilateral, severe stage: Secondary | ICD-10-CM | POA: Diagnosis not present

## 2017-09-03 ENCOUNTER — Ambulatory Visit: Payer: Medicare Other | Admitting: Podiatry

## 2017-09-04 ENCOUNTER — Ambulatory Visit: Payer: Medicare Other | Admitting: Internal Medicine

## 2017-09-04 ENCOUNTER — Telehealth: Payer: Self-pay | Admitting: Internal Medicine

## 2017-09-04 NOTE — Telephone Encounter (Signed)
Lisa [name] called for clarification on  cyanocobalamin 1000 MCG tablet It is prescribed for 1000MCG but the instructions say take 100MCG. I do not know who prescribes this and they would like to know if she is supposed to be taking it or if not they would like a discontinue order.  Please advise and call back

## 2017-09-04 NOTE — Telephone Encounter (Signed)
Please advise, this medication is a historical med

## 2017-09-06 MED ORDER — CYANOCOBALAMIN 1000 MCG PO TABS: 1000.0000 ug | ORAL_TABLET | Freq: Every day | ORAL | 3 refills | Status: DC

## 2017-09-06 NOTE — Telephone Encounter (Signed)
1000 mcg/day is correct Thx

## 2017-09-07 ENCOUNTER — Telehealth: Payer: Self-pay | Admitting: Internal Medicine

## 2017-09-07 NOTE — Telephone Encounter (Signed)
[facility] notified

## 2017-09-07 NOTE — Telephone Encounter (Signed)
Renita from [facility] called requesting verbal orders for skilled nursing evaluation and physical therapy evaluation.

## 2017-09-08 ENCOUNTER — Other Ambulatory Visit: Payer: Self-pay | Admitting: Internal Medicine

## 2017-09-08 NOTE — Telephone Encounter (Signed)
OK. Thx

## 2017-09-08 NOTE — Telephone Encounter (Signed)
Notified Renita w/MD response...[name]

## 2017-09-11 DIAGNOSIS — M6281 Muscle weakness (generalized): Secondary | ICD-10-CM | POA: Diagnosis not present

## 2017-09-11 DIAGNOSIS — M1990 Unspecified osteoarthritis, unspecified site: Secondary | ICD-10-CM | POA: Diagnosis not present

## 2017-09-11 DIAGNOSIS — M791 Myalgia: Secondary | ICD-10-CM | POA: Diagnosis not present

## 2017-09-11 DIAGNOSIS — G309 Alzheimer's disease, unspecified: Secondary | ICD-10-CM | POA: Diagnosis not present

## 2017-09-11 DIAGNOSIS — E785 Hyperlipidemia, unspecified: Secondary | ICD-10-CM | POA: Diagnosis not present

## 2017-09-11 DIAGNOSIS — I739 Peripheral vascular disease, unspecified: Secondary | ICD-10-CM | POA: Diagnosis not present

## 2017-09-11 DIAGNOSIS — E039 Hypothyroidism, unspecified: Secondary | ICD-10-CM | POA: Diagnosis not present

## 2017-09-11 DIAGNOSIS — S52502D Unspecified fracture of the lower end of left radius, subsequent encounter for closed fracture with routine healing: Secondary | ICD-10-CM | POA: Diagnosis not present

## 2017-09-11 DIAGNOSIS — Z9181 History of falling: Secondary | ICD-10-CM | POA: Diagnosis not present

## 2017-09-11 DIAGNOSIS — S91301D Unspecified open wound, right foot, subsequent encounter: Secondary | ICD-10-CM | POA: Diagnosis not present

## 2017-09-11 DIAGNOSIS — I10 Essential (primary) hypertension: Secondary | ICD-10-CM | POA: Diagnosis not present

## 2017-09-11 DIAGNOSIS — F329 Major depressive disorder, single episode, unspecified: Secondary | ICD-10-CM | POA: Diagnosis not present

## 2017-09-11 DIAGNOSIS — F0280 Dementia in other diseases classified elsewhere without behavioral disturbance: Secondary | ICD-10-CM | POA: Diagnosis not present

## 2017-09-15 ENCOUNTER — Other Ambulatory Visit: Payer: Self-pay | Admitting: Internal Medicine

## 2017-09-15 DIAGNOSIS — E038 Other specified hypothyroidism: Secondary | ICD-10-CM | POA: Diagnosis not present

## 2017-09-15 DIAGNOSIS — M859 Disorder of bone density and structure, unspecified: Secondary | ICD-10-CM | POA: Diagnosis not present

## 2017-09-15 DIAGNOSIS — I10 Essential (primary) hypertension: Secondary | ICD-10-CM | POA: Diagnosis not present

## 2017-09-15 DIAGNOSIS — F39 Unspecified mood [affective] disorder: Secondary | ICD-10-CM | POA: Diagnosis not present

## 2017-09-15 DIAGNOSIS — H4089 Other specified glaucoma: Secondary | ICD-10-CM | POA: Diagnosis not present

## 2017-09-15 DIAGNOSIS — F0280 Dementia in other diseases classified elsewhere without behavioral disturbance: Secondary | ICD-10-CM | POA: Diagnosis not present

## 2017-09-16 DIAGNOSIS — S91301D Unspecified open wound, right foot, subsequent encounter: Secondary | ICD-10-CM | POA: Diagnosis not present

## 2017-09-16 DIAGNOSIS — M6281 Muscle weakness (generalized): Secondary | ICD-10-CM | POA: Diagnosis not present

## 2017-09-16 DIAGNOSIS — F0280 Dementia in other diseases classified elsewhere without behavioral disturbance: Secondary | ICD-10-CM | POA: Diagnosis not present

## 2017-09-16 DIAGNOSIS — M1990 Unspecified osteoarthritis, unspecified site: Secondary | ICD-10-CM | POA: Diagnosis not present

## 2017-09-16 DIAGNOSIS — I10 Essential (primary) hypertension: Secondary | ICD-10-CM | POA: Diagnosis not present

## 2017-09-16 DIAGNOSIS — G309 Alzheimer's disease, unspecified: Secondary | ICD-10-CM | POA: Diagnosis not present

## 2017-09-18 DIAGNOSIS — H189 Unspecified disorder of cornea: Secondary | ICD-10-CM | POA: Diagnosis not present

## 2017-09-18 DIAGNOSIS — H0520 Unspecified exophthalmos: Secondary | ICD-10-CM | POA: Diagnosis not present

## 2017-09-18 DIAGNOSIS — H16219 Exposure keratoconjunctivitis, unspecified eye: Secondary | ICD-10-CM | POA: Diagnosis not present

## 2017-09-18 DIAGNOSIS — Z961 Presence of intraocular lens: Secondary | ICD-10-CM | POA: Diagnosis not present

## 2017-09-18 DIAGNOSIS — S91301D Unspecified open wound, right foot, subsequent encounter: Secondary | ICD-10-CM | POA: Diagnosis not present

## 2017-09-18 DIAGNOSIS — F0280 Dementia in other diseases classified elsewhere without behavioral disturbance: Secondary | ICD-10-CM | POA: Diagnosis not present

## 2017-09-18 DIAGNOSIS — I10 Essential (primary) hypertension: Secondary | ICD-10-CM | POA: Diagnosis not present

## 2017-09-18 DIAGNOSIS — H353130 Nonexudative age-related macular degeneration, bilateral, stage unspecified: Secondary | ICD-10-CM | POA: Diagnosis not present

## 2017-09-18 DIAGNOSIS — M6281 Muscle weakness (generalized): Secondary | ICD-10-CM | POA: Diagnosis not present

## 2017-09-18 DIAGNOSIS — H16203 Unspecified keratoconjunctivitis, bilateral: Secondary | ICD-10-CM | POA: Diagnosis not present

## 2017-09-18 DIAGNOSIS — G309 Alzheimer's disease, unspecified: Secondary | ICD-10-CM | POA: Diagnosis not present

## 2017-09-18 DIAGNOSIS — M1990 Unspecified osteoarthritis, unspecified site: Secondary | ICD-10-CM | POA: Diagnosis not present

## 2017-09-22 ENCOUNTER — Other Ambulatory Visit: Payer: Self-pay | Admitting: Internal Medicine

## 2017-09-25 DIAGNOSIS — F0280 Dementia in other diseases classified elsewhere without behavioral disturbance: Secondary | ICD-10-CM | POA: Diagnosis not present

## 2017-09-25 DIAGNOSIS — M6281 Muscle weakness (generalized): Secondary | ICD-10-CM | POA: Diagnosis not present

## 2017-09-25 DIAGNOSIS — G309 Alzheimer's disease, unspecified: Secondary | ICD-10-CM | POA: Diagnosis not present

## 2017-09-25 DIAGNOSIS — S91301D Unspecified open wound, right foot, subsequent encounter: Secondary | ICD-10-CM | POA: Diagnosis not present

## 2017-09-25 DIAGNOSIS — M1990 Unspecified osteoarthritis, unspecified site: Secondary | ICD-10-CM | POA: Diagnosis not present

## 2017-09-25 DIAGNOSIS — I10 Essential (primary) hypertension: Secondary | ICD-10-CM | POA: Diagnosis not present

## 2017-09-29 DIAGNOSIS — Z79899 Other long term (current) drug therapy: Secondary | ICD-10-CM | POA: Diagnosis not present

## 2017-09-29 DIAGNOSIS — F0280 Dementia in other diseases classified elsewhere without behavioral disturbance: Secondary | ICD-10-CM | POA: Diagnosis not present

## 2017-09-29 DIAGNOSIS — S91301D Unspecified open wound, right foot, subsequent encounter: Secondary | ICD-10-CM | POA: Diagnosis not present

## 2017-09-29 DIAGNOSIS — I10 Essential (primary) hypertension: Secondary | ICD-10-CM | POA: Diagnosis not present

## 2017-09-29 DIAGNOSIS — M1990 Unspecified osteoarthritis, unspecified site: Secondary | ICD-10-CM | POA: Diagnosis not present

## 2017-09-29 DIAGNOSIS — G309 Alzheimer's disease, unspecified: Secondary | ICD-10-CM | POA: Diagnosis not present

## 2017-09-29 DIAGNOSIS — M6281 Muscle weakness (generalized): Secondary | ICD-10-CM | POA: Diagnosis not present

## 2017-10-06 ENCOUNTER — Telehealth: Payer: Self-pay | Admitting: Internal Medicine

## 2017-10-06 DIAGNOSIS — G308 Other Alzheimer's disease: Secondary | ICD-10-CM | POA: Diagnosis not present

## 2017-10-06 DIAGNOSIS — H0510 Unspecified chronic inflammatory disorders of orbit: Secondary | ICD-10-CM | POA: Diagnosis not present

## 2017-10-06 DIAGNOSIS — S91301D Unspecified open wound, right foot, subsequent encounter: Secondary | ICD-10-CM | POA: Diagnosis not present

## 2017-10-06 DIAGNOSIS — E038 Other specified hypothyroidism: Secondary | ICD-10-CM | POA: Diagnosis not present

## 2017-10-06 DIAGNOSIS — F989 Unspecified behavioral and emotional disorders with onset usually occurring in childhood and adolescence: Secondary | ICD-10-CM | POA: Diagnosis not present

## 2017-10-06 DIAGNOSIS — M1990 Unspecified osteoarthritis, unspecified site: Secondary | ICD-10-CM | POA: Diagnosis not present

## 2017-10-06 DIAGNOSIS — M6281 Muscle weakness (generalized): Secondary | ICD-10-CM | POA: Diagnosis not present

## 2017-10-06 DIAGNOSIS — F0280 Dementia in other diseases classified elsewhere without behavioral disturbance: Secondary | ICD-10-CM | POA: Diagnosis not present

## 2017-10-06 DIAGNOSIS — I10 Essential (primary) hypertension: Secondary | ICD-10-CM | POA: Diagnosis not present

## 2017-10-06 DIAGNOSIS — S91301S Unspecified open wound, right foot, sequela: Secondary | ICD-10-CM | POA: Diagnosis not present

## 2017-10-06 DIAGNOSIS — G309 Alzheimer's disease, unspecified: Secondary | ICD-10-CM | POA: Diagnosis not present

## 2017-10-06 DIAGNOSIS — F3489 Other specified persistent mood disorders: Secondary | ICD-10-CM | POA: Diagnosis not present

## 2017-10-06 NOTE — Telephone Encounter (Signed)
When calling daughter back husband answered the phone, and stated that the message was for him and not his wife. He is having trouble sleeping... Wife is sleeping fine she is currently in a facility. Inform husband will have to send Dr. Alain Marion a new msg under his chart. Msg was given to him under his wife chart...Lisa [name]

## 2017-10-06 NOTE — Telephone Encounter (Signed)
Patient's daughter called and said the nursing home is having a hard time with the patient at night. She is getting up and very confused with where she is at. She wants to know if we can send something over for sleep. Please advise.   Fax : [phone]

## 2017-10-06 NOTE — Telephone Encounter (Signed)
pls try Trazodone - see Rx Thx

## 2017-10-06 NOTE — Telephone Encounter (Signed)
Noted. Thx.

## 2017-10-07 NOTE — Telephone Encounter (Signed)
Pt daughter called in about the med and wanted to make sure that it went to the [place] -[phone]

## 2017-10-09 ENCOUNTER — Telehealth: Payer: Self-pay | Admitting: Internal Medicine

## 2017-10-09 MED ORDER — TRAZODONE HCL 50 MG PO TABS: 50.0000 mg | ORAL_TABLET | Freq: Every day | ORAL | 1 refills | Status: AC

## 2017-10-09 NOTE — Addendum Note (Signed)
Addended by: Earnstine Regal on: 10/09/2017 10:51 AM   Modules accepted: Orders

## 2017-10-09 NOTE — Telephone Encounter (Signed)
States order for trazodone 50 mg was faxed over.  States that order states to take one to two pills per day.  Needs clarification either for patient to take one pill a day or two pills a day.  Can fax new order to [phone]

## 2017-10-09 NOTE — Telephone Encounter (Signed)
Daughter requesting call back as soon as possible in regard to trazodone being faxed to [place].  Daughter would also like to know how patient is to take this medication.  Daughter can be reached at [phone].

## 2017-10-09 NOTE — Telephone Encounter (Signed)
FYI.Marland KitchenMarland KitchenMarland KitchenCalled daughter back (verified name Juliann Pulse) verified msg's below since it seems to be some confusion since pt husband stated that the msg's was meant for him. Daughter stated no mom is the once that is needing something to help her sleep since [place] is stating that she is not sleep at night. Inform Juliann Pulse what MD was going to rx  "Trazodone" Verified where rx need to be sent. Inform daughter Juliann Pulse) will print rx and faxed over to [facility].....[name]

## 2017-10-09 NOTE — Telephone Encounter (Signed)
Called Lisa [name] back was sent to her vm [facility] pt will start out with 1 tab at bedtime, and if that doesn't pt can take 2 at bedtime...Lisa [name]

## 2017-10-14 DIAGNOSIS — I10 Essential (primary) hypertension: Secondary | ICD-10-CM | POA: Diagnosis not present

## 2017-10-14 DIAGNOSIS — M1990 Unspecified osteoarthritis, unspecified site: Secondary | ICD-10-CM | POA: Diagnosis not present

## 2017-10-14 DIAGNOSIS — G309 Alzheimer's disease, unspecified: Secondary | ICD-10-CM | POA: Diagnosis not present

## 2017-10-14 DIAGNOSIS — S91301D Unspecified open wound, right foot, subsequent encounter: Secondary | ICD-10-CM | POA: Diagnosis not present

## 2017-10-14 DIAGNOSIS — M6281 Muscle weakness (generalized): Secondary | ICD-10-CM | POA: Diagnosis not present

## 2017-10-14 DIAGNOSIS — F0280 Dementia in other diseases classified elsewhere without behavioral disturbance: Secondary | ICD-10-CM | POA: Diagnosis not present

## 2017-10-19 DIAGNOSIS — I739 Peripheral vascular disease, unspecified: Secondary | ICD-10-CM | POA: Diagnosis not present

## 2017-10-19 DIAGNOSIS — R609 Edema, unspecified: Secondary | ICD-10-CM | POA: Diagnosis not present

## 2017-10-19 DIAGNOSIS — B351 Tinea unguium: Secondary | ICD-10-CM | POA: Diagnosis not present

## 2017-10-20 DIAGNOSIS — F418 Other specified anxiety disorders: Secondary | ICD-10-CM | POA: Diagnosis not present

## 2017-10-20 DIAGNOSIS — G301 Alzheimer's disease with late onset: Secondary | ICD-10-CM | POA: Diagnosis not present

## 2017-10-20 DIAGNOSIS — R609 Edema, unspecified: Secondary | ICD-10-CM | POA: Diagnosis not present

## 2017-10-20 DIAGNOSIS — R451 Restlessness and agitation: Secondary | ICD-10-CM | POA: Diagnosis not present

## 2017-10-20 DIAGNOSIS — R54 Age-related physical debility: Secondary | ICD-10-CM | POA: Diagnosis not present

## 2017-10-20 DIAGNOSIS — L03818 Cellulitis of other sites: Secondary | ICD-10-CM | POA: Diagnosis not present

## 2017-10-20 DIAGNOSIS — E038 Other specified hypothyroidism: Secondary | ICD-10-CM | POA: Diagnosis not present

## 2017-10-20 DIAGNOSIS — I10 Essential (primary) hypertension: Secondary | ICD-10-CM | POA: Diagnosis not present

## 2017-10-22 ENCOUNTER — Telehealth: Payer: Self-pay | Admitting: Internal Medicine

## 2017-10-22 NOTE — Telephone Encounter (Signed)
Called patient to schedule AWV. Patient did not answer. Will try to call patient again at another time. SF

## 2017-10-23 DIAGNOSIS — M6281 Muscle weakness (generalized): Secondary | ICD-10-CM | POA: Diagnosis not present

## 2017-10-23 DIAGNOSIS — S91301D Unspecified open wound, right foot, subsequent encounter: Secondary | ICD-10-CM | POA: Diagnosis not present

## 2017-10-23 DIAGNOSIS — I10 Essential (primary) hypertension: Secondary | ICD-10-CM | POA: Diagnosis not present

## 2017-10-23 DIAGNOSIS — M1990 Unspecified osteoarthritis, unspecified site: Secondary | ICD-10-CM | POA: Diagnosis not present

## 2017-10-23 DIAGNOSIS — F0280 Dementia in other diseases classified elsewhere without behavioral disturbance: Secondary | ICD-10-CM | POA: Diagnosis not present

## 2017-10-23 DIAGNOSIS — G309 Alzheimer's disease, unspecified: Secondary | ICD-10-CM | POA: Diagnosis not present

## 2017-10-28 DIAGNOSIS — M1990 Unspecified osteoarthritis, unspecified site: Secondary | ICD-10-CM | POA: Diagnosis not present

## 2017-10-28 DIAGNOSIS — M6281 Muscle weakness (generalized): Secondary | ICD-10-CM | POA: Diagnosis not present

## 2017-10-28 DIAGNOSIS — I10 Essential (primary) hypertension: Secondary | ICD-10-CM | POA: Diagnosis not present

## 2017-10-28 DIAGNOSIS — G309 Alzheimer's disease, unspecified: Secondary | ICD-10-CM | POA: Diagnosis not present

## 2017-10-28 DIAGNOSIS — F0280 Dementia in other diseases classified elsewhere without behavioral disturbance: Secondary | ICD-10-CM | POA: Diagnosis not present

## 2017-10-28 DIAGNOSIS — S91301D Unspecified open wound, right foot, subsequent encounter: Secondary | ICD-10-CM | POA: Diagnosis not present

## 2017-11-09 DIAGNOSIS — F0280 Dementia in other diseases classified elsewhere without behavioral disturbance: Secondary | ICD-10-CM | POA: Diagnosis not present

## 2017-11-09 DIAGNOSIS — S91301D Unspecified open wound, right foot, subsequent encounter: Secondary | ICD-10-CM | POA: Diagnosis not present

## 2017-11-09 DIAGNOSIS — G309 Alzheimer's disease, unspecified: Secondary | ICD-10-CM | POA: Diagnosis not present

## 2017-11-09 DIAGNOSIS — M1990 Unspecified osteoarthritis, unspecified site: Secondary | ICD-10-CM | POA: Diagnosis not present

## 2017-11-09 DIAGNOSIS — M6281 Muscle weakness (generalized): Secondary | ICD-10-CM | POA: Diagnosis not present

## 2017-11-09 DIAGNOSIS — I10 Essential (primary) hypertension: Secondary | ICD-10-CM | POA: Diagnosis not present

## 2017-11-10 DIAGNOSIS — F329 Major depressive disorder, single episode, unspecified: Secondary | ICD-10-CM | POA: Diagnosis not present

## 2017-11-10 DIAGNOSIS — I10 Essential (primary) hypertension: Secondary | ICD-10-CM | POA: Diagnosis not present

## 2017-11-10 DIAGNOSIS — M1990 Unspecified osteoarthritis, unspecified site: Secondary | ICD-10-CM | POA: Diagnosis not present

## 2017-11-10 DIAGNOSIS — S52502D Unspecified fracture of the lower end of left radius, subsequent encounter for closed fracture with routine healing: Secondary | ICD-10-CM | POA: Diagnosis not present

## 2017-11-10 DIAGNOSIS — F0280 Dementia in other diseases classified elsewhere without behavioral disturbance: Secondary | ICD-10-CM | POA: Diagnosis not present

## 2017-11-10 DIAGNOSIS — M791 Myalgia, unspecified site: Secondary | ICD-10-CM | POA: Diagnosis not present

## 2017-11-10 DIAGNOSIS — E039 Hypothyroidism, unspecified: Secondary | ICD-10-CM | POA: Diagnosis not present

## 2017-11-10 DIAGNOSIS — Z9181 History of falling: Secondary | ICD-10-CM | POA: Diagnosis not present

## 2017-11-10 DIAGNOSIS — G309 Alzheimer's disease, unspecified: Secondary | ICD-10-CM | POA: Diagnosis not present

## 2017-11-10 DIAGNOSIS — I739 Peripheral vascular disease, unspecified: Secondary | ICD-10-CM | POA: Diagnosis not present

## 2017-11-10 DIAGNOSIS — E785 Hyperlipidemia, unspecified: Secondary | ICD-10-CM | POA: Diagnosis not present

## 2017-12-01 DIAGNOSIS — Z79899 Other long term (current) drug therapy: Secondary | ICD-10-CM | POA: Diagnosis not present

## 2017-12-09 DIAGNOSIS — M791 Myalgia, unspecified site: Secondary | ICD-10-CM | POA: Diagnosis not present

## 2017-12-09 DIAGNOSIS — M1990 Unspecified osteoarthritis, unspecified site: Secondary | ICD-10-CM | POA: Diagnosis not present

## 2017-12-09 DIAGNOSIS — I739 Peripheral vascular disease, unspecified: Secondary | ICD-10-CM | POA: Diagnosis not present

## 2017-12-09 DIAGNOSIS — F0280 Dementia in other diseases classified elsewhere without behavioral disturbance: Secondary | ICD-10-CM | POA: Diagnosis not present

## 2017-12-09 DIAGNOSIS — G309 Alzheimer's disease, unspecified: Secondary | ICD-10-CM | POA: Diagnosis not present

## 2017-12-09 DIAGNOSIS — I10 Essential (primary) hypertension: Secondary | ICD-10-CM | POA: Diagnosis not present

## 2017-12-23 DIAGNOSIS — B351 Tinea unguium: Secondary | ICD-10-CM | POA: Diagnosis not present

## 2017-12-23 DIAGNOSIS — I739 Peripheral vascular disease, unspecified: Secondary | ICD-10-CM | POA: Diagnosis not present

## 2017-12-23 DIAGNOSIS — R609 Edema, unspecified: Secondary | ICD-10-CM | POA: Diagnosis not present

## 2017-12-25 DIAGNOSIS — F0280 Dementia in other diseases classified elsewhere without behavioral disturbance: Secondary | ICD-10-CM | POA: Diagnosis not present

## 2017-12-25 DIAGNOSIS — M1990 Unspecified osteoarthritis, unspecified site: Secondary | ICD-10-CM | POA: Diagnosis not present

## 2017-12-25 DIAGNOSIS — I10 Essential (primary) hypertension: Secondary | ICD-10-CM | POA: Diagnosis not present

## 2017-12-25 DIAGNOSIS — M791 Myalgia, unspecified site: Secondary | ICD-10-CM | POA: Diagnosis not present

## 2017-12-25 DIAGNOSIS — G309 Alzheimer's disease, unspecified: Secondary | ICD-10-CM | POA: Diagnosis not present

## 2017-12-25 DIAGNOSIS — I739 Peripheral vascular disease, unspecified: Secondary | ICD-10-CM | POA: Diagnosis not present

## 2017-12-29 DIAGNOSIS — E038 Other specified hypothyroidism: Secondary | ICD-10-CM | POA: Diagnosis not present

## 2017-12-29 DIAGNOSIS — Z79899 Other long term (current) drug therapy: Secondary | ICD-10-CM | POA: Diagnosis not present

## 2018-01-04 DIAGNOSIS — M1990 Unspecified osteoarthritis, unspecified site: Secondary | ICD-10-CM | POA: Diagnosis not present

## 2018-01-04 DIAGNOSIS — I739 Peripheral vascular disease, unspecified: Secondary | ICD-10-CM | POA: Diagnosis not present

## 2018-01-04 DIAGNOSIS — G309 Alzheimer's disease, unspecified: Secondary | ICD-10-CM | POA: Diagnosis not present

## 2018-01-04 DIAGNOSIS — I10 Essential (primary) hypertension: Secondary | ICD-10-CM | POA: Diagnosis not present

## 2018-01-04 DIAGNOSIS — M791 Myalgia, unspecified site: Secondary | ICD-10-CM | POA: Diagnosis not present

## 2018-01-04 DIAGNOSIS — F0280 Dementia in other diseases classified elsewhere without behavioral disturbance: Secondary | ICD-10-CM | POA: Diagnosis not present

## 2018-01-06 DIAGNOSIS — E038 Other specified hypothyroidism: Secondary | ICD-10-CM | POA: Diagnosis not present

## 2018-01-06 DIAGNOSIS — Z79899 Other long term (current) drug therapy: Secondary | ICD-10-CM | POA: Diagnosis not present

## 2018-01-09 DIAGNOSIS — M25552 Pain in left hip: Secondary | ICD-10-CM | POA: Diagnosis not present

## 2018-01-12 DIAGNOSIS — H02109 Unspecified ectropion of unspecified eye, unspecified eyelid: Secondary | ICD-10-CM | POA: Diagnosis not present

## 2018-01-12 DIAGNOSIS — I10 Essential (primary) hypertension: Secondary | ICD-10-CM | POA: Diagnosis not present

## 2018-01-12 DIAGNOSIS — H4089 Other specified glaucoma: Secondary | ICD-10-CM | POA: Diagnosis not present

## 2018-01-12 DIAGNOSIS — F39 Unspecified mood [affective] disorder: Secondary | ICD-10-CM | POA: Diagnosis not present

## 2018-01-12 DIAGNOSIS — M25552 Pain in left hip: Secondary | ICD-10-CM | POA: Diagnosis not present

## 2018-01-12 DIAGNOSIS — E0590 Thyrotoxicosis, unspecified without thyrotoxic crisis or storm: Secondary | ICD-10-CM | POA: Diagnosis not present

## 2018-01-12 DIAGNOSIS — G308 Other Alzheimer's disease: Secondary | ICD-10-CM | POA: Diagnosis not present

## 2018-01-15 DIAGNOSIS — M25552 Pain in left hip: Secondary | ICD-10-CM | POA: Diagnosis not present

## 2018-01-15 DIAGNOSIS — G309 Alzheimer's disease, unspecified: Secondary | ICD-10-CM | POA: Diagnosis not present

## 2018-01-19 DIAGNOSIS — F3489 Other specified persistent mood disorders: Secondary | ICD-10-CM | POA: Diagnosis not present

## 2018-01-19 DIAGNOSIS — G308 Other Alzheimer's disease: Secondary | ICD-10-CM | POA: Diagnosis not present

## 2018-01-19 DIAGNOSIS — E0590 Thyrotoxicosis, unspecified without thyrotoxic crisis or storm: Secondary | ICD-10-CM | POA: Diagnosis not present

## 2018-01-19 DIAGNOSIS — M25552 Pain in left hip: Secondary | ICD-10-CM | POA: Diagnosis not present

## 2018-01-19 DIAGNOSIS — G309 Alzheimer's disease, unspecified: Secondary | ICD-10-CM | POA: Diagnosis not present

## 2018-01-20 DIAGNOSIS — G309 Alzheimer's disease, unspecified: Secondary | ICD-10-CM | POA: Diagnosis not present

## 2018-01-20 DIAGNOSIS — M25552 Pain in left hip: Secondary | ICD-10-CM | POA: Diagnosis not present

## 2018-01-21 DIAGNOSIS — G309 Alzheimer's disease, unspecified: Secondary | ICD-10-CM | POA: Diagnosis not present

## 2018-01-21 DIAGNOSIS — M25552 Pain in left hip: Secondary | ICD-10-CM | POA: Diagnosis not present

## 2018-01-26 DIAGNOSIS — M25552 Pain in left hip: Secondary | ICD-10-CM | POA: Diagnosis not present

## 2018-01-26 DIAGNOSIS — G309 Alzheimer's disease, unspecified: Secondary | ICD-10-CM | POA: Diagnosis not present

## 2018-01-28 DIAGNOSIS — M25552 Pain in left hip: Secondary | ICD-10-CM | POA: Diagnosis not present

## 2018-01-28 DIAGNOSIS — G309 Alzheimer's disease, unspecified: Secondary | ICD-10-CM | POA: Diagnosis not present

## 2018-01-29 DIAGNOSIS — M25552 Pain in left hip: Secondary | ICD-10-CM | POA: Diagnosis not present

## 2018-01-29 DIAGNOSIS — G309 Alzheimer's disease, unspecified: Secondary | ICD-10-CM | POA: Diagnosis not present

## 2018-02-01 DIAGNOSIS — M25552 Pain in left hip: Secondary | ICD-10-CM | POA: Diagnosis not present

## 2018-02-01 DIAGNOSIS — G309 Alzheimer's disease, unspecified: Secondary | ICD-10-CM | POA: Diagnosis not present

## 2018-02-02 DIAGNOSIS — R2689 Other abnormalities of gait and mobility: Secondary | ICD-10-CM | POA: Diagnosis not present

## 2018-02-02 DIAGNOSIS — E038 Other specified hypothyroidism: Secondary | ICD-10-CM | POA: Diagnosis not present

## 2018-02-02 DIAGNOSIS — G309 Alzheimer's disease, unspecified: Secondary | ICD-10-CM | POA: Diagnosis not present

## 2018-02-02 DIAGNOSIS — G308 Other Alzheimer's disease: Secondary | ICD-10-CM | POA: Diagnosis not present

## 2018-02-02 DIAGNOSIS — M25552 Pain in left hip: Secondary | ICD-10-CM | POA: Diagnosis not present

## 2018-02-05 DIAGNOSIS — G309 Alzheimer's disease, unspecified: Secondary | ICD-10-CM | POA: Diagnosis not present

## 2018-02-05 DIAGNOSIS — M25552 Pain in left hip: Secondary | ICD-10-CM | POA: Diagnosis not present

## 2018-02-08 DIAGNOSIS — M25552 Pain in left hip: Secondary | ICD-10-CM | POA: Diagnosis not present

## 2018-02-08 DIAGNOSIS — G309 Alzheimer's disease, unspecified: Secondary | ICD-10-CM | POA: Diagnosis not present

## 2018-02-09 DIAGNOSIS — B351 Tinea unguium: Secondary | ICD-10-CM | POA: Diagnosis not present

## 2018-02-09 DIAGNOSIS — M79675 Pain in left toe(s): Secondary | ICD-10-CM | POA: Diagnosis not present

## 2018-02-09 DIAGNOSIS — F39 Unspecified mood [affective] disorder: Secondary | ICD-10-CM | POA: Diagnosis not present

## 2018-02-09 DIAGNOSIS — R600 Localized edema: Secondary | ICD-10-CM | POA: Diagnosis not present

## 2018-02-09 DIAGNOSIS — I10 Essential (primary) hypertension: Secondary | ICD-10-CM | POA: Diagnosis not present

## 2018-02-09 DIAGNOSIS — M25552 Pain in left hip: Secondary | ICD-10-CM | POA: Diagnosis not present

## 2018-02-09 DIAGNOSIS — R634 Abnormal weight loss: Secondary | ICD-10-CM | POA: Diagnosis not present

## 2018-02-09 DIAGNOSIS — E0590 Thyrotoxicosis, unspecified without thyrotoxic crisis or storm: Secondary | ICD-10-CM | POA: Diagnosis not present

## 2018-02-09 DIAGNOSIS — M79674 Pain in right toe(s): Secondary | ICD-10-CM | POA: Diagnosis not present

## 2018-02-11 DIAGNOSIS — M25552 Pain in left hip: Secondary | ICD-10-CM | POA: Diagnosis not present

## 2018-02-11 DIAGNOSIS — G309 Alzheimer's disease, unspecified: Secondary | ICD-10-CM | POA: Diagnosis not present

## 2018-02-15 DIAGNOSIS — H401133 Primary open-angle glaucoma, bilateral, severe stage: Secondary | ICD-10-CM | POA: Diagnosis not present

## 2018-02-16 DIAGNOSIS — E038 Other specified hypothyroidism: Secondary | ICD-10-CM | POA: Diagnosis not present

## 2018-02-16 DIAGNOSIS — Z79899 Other long term (current) drug therapy: Secondary | ICD-10-CM | POA: Diagnosis not present

## 2018-02-18 DIAGNOSIS — M25552 Pain in left hip: Secondary | ICD-10-CM | POA: Diagnosis not present

## 2018-02-18 DIAGNOSIS — G309 Alzheimer's disease, unspecified: Secondary | ICD-10-CM | POA: Diagnosis not present

## 2018-02-21 ENCOUNTER — Encounter (HOSPITAL_COMMUNITY): Payer: Self-pay

## 2018-02-21 ENCOUNTER — Other Ambulatory Visit: Payer: Self-pay

## 2018-02-21 ENCOUNTER — Emergency Department (HOSPITAL_COMMUNITY)
Admission: EM | Admit: 2018-02-21 | Discharge: 2018-02-21 | Disposition: A | Discharge status: Home / Self Care | Payer: Medicare Other | Attending: Emergency Medicine | Admitting: Emergency Medicine

## 2018-02-21 ENCOUNTER — Emergency Department (HOSPITAL_COMMUNITY): Payer: Medicare Other

## 2018-02-21 DIAGNOSIS — Z79899 Other long term (current) drug therapy: Secondary | ICD-10-CM | POA: Insufficient documentation

## 2018-02-21 DIAGNOSIS — Z96659 Presence of unspecified artificial knee joint: Secondary | ICD-10-CM | POA: Diagnosis not present

## 2018-02-21 DIAGNOSIS — I10 Essential (primary) hypertension: Secondary | ICD-10-CM | POA: Diagnosis not present

## 2018-02-21 DIAGNOSIS — W0110XA Fall on same level from slipping, tripping and stumbling with subsequent striking against unspecified object, initial encounter: Secondary | ICD-10-CM | POA: Diagnosis not present

## 2018-02-21 DIAGNOSIS — S0990XA Unspecified injury of head, initial encounter: Secondary | ICD-10-CM | POA: Diagnosis not present

## 2018-02-21 DIAGNOSIS — E039 Hypothyroidism, unspecified: Secondary | ICD-10-CM | POA: Diagnosis not present

## 2018-02-21 DIAGNOSIS — Y998 Other external cause status: Secondary | ICD-10-CM | POA: Insufficient documentation

## 2018-02-21 DIAGNOSIS — F0280 Dementia in other diseases classified elsewhere without behavioral disturbance: Secondary | ICD-10-CM | POA: Insufficient documentation

## 2018-02-21 DIAGNOSIS — W19XXXA Unspecified fall, initial encounter: Secondary | ICD-10-CM

## 2018-02-21 DIAGNOSIS — Y9389 Activity, other specified: Secondary | ICD-10-CM | POA: Insufficient documentation

## 2018-02-21 DIAGNOSIS — G309 Alzheimer's disease, unspecified: Secondary | ICD-10-CM | POA: Insufficient documentation

## 2018-02-21 DIAGNOSIS — R030 Elevated blood-pressure reading, without diagnosis of hypertension: Secondary | ICD-10-CM | POA: Diagnosis not present

## 2018-02-21 DIAGNOSIS — Y92129 Unspecified place in nursing home as the place of occurrence of the external cause: Secondary | ICD-10-CM | POA: Diagnosis not present

## 2018-02-21 DIAGNOSIS — G8911 Acute pain due to trauma: Secondary | ICD-10-CM | POA: Diagnosis not present

## 2018-02-21 LAB — URINALYSIS, ROUTINE W REFLEX MICROSCOPIC
BILIRUBIN URINE: NEGATIVE
Bacteria, UA: NONE SEEN
Glucose, UA: NEGATIVE mg/dL
HGB URINE DIPSTICK: NEGATIVE
KETONES UR: NEGATIVE mg/dL
NITRITE: NEGATIVE
PH: 7 (ref 5.0–8.0)
Protein, ur: 100 mg/dL — AB
SPECIFIC GRAVITY, URINE: 1.015 (ref 1.005–1.030)

## 2018-02-21 NOTE — ED Notes (Signed)
ED Provider at bedside. 
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
## 2018-02-21 NOTE — ED Notes (Addendum)
Pt was ambulated in hall by NT. Pt did well with 1 person assist walking in hall and no assistance getting up from bed. No pain was noted while pt was ambulating.

## 2018-02-21 NOTE — ED Notes (Signed)
[facility] has been called and is in route to pick pt up for transport.

## 2018-02-21 NOTE — ED Notes (Signed)
Pt is unable to sign due to dementia and being in hallway bed.

## 2018-02-23 DIAGNOSIS — G301 Alzheimer's disease with late onset: Secondary | ICD-10-CM | POA: Diagnosis not present

## 2018-02-23 DIAGNOSIS — R609 Edema, unspecified: Secondary | ICD-10-CM | POA: Diagnosis not present

## 2018-02-23 DIAGNOSIS — R600 Localized edema: Secondary | ICD-10-CM | POA: Diagnosis not present

## 2018-02-23 DIAGNOSIS — B351 Tinea unguium: Secondary | ICD-10-CM | POA: Diagnosis not present

## 2018-02-23 DIAGNOSIS — F0281 Dementia in other diseases classified elsewhere with behavioral disturbance: Secondary | ICD-10-CM | POA: Diagnosis not present

## 2018-02-23 DIAGNOSIS — M25552 Pain in left hip: Secondary | ICD-10-CM | POA: Diagnosis not present

## 2018-02-23 DIAGNOSIS — F3489 Other specified persistent mood disorders: Secondary | ICD-10-CM | POA: Diagnosis not present

## 2018-02-23 DIAGNOSIS — E0590 Thyrotoxicosis, unspecified without thyrotoxic crisis or storm: Secondary | ICD-10-CM | POA: Diagnosis not present

## 2018-02-23 DIAGNOSIS — I10 Essential (primary) hypertension: Secondary | ICD-10-CM | POA: Diagnosis not present

## 2018-02-23 DIAGNOSIS — I739 Peripheral vascular disease, unspecified: Secondary | ICD-10-CM | POA: Diagnosis not present

## 2018-03-09 DIAGNOSIS — M25552 Pain in left hip: Secondary | ICD-10-CM | POA: Diagnosis not present

## 2018-03-09 DIAGNOSIS — G309 Alzheimer's disease, unspecified: Secondary | ICD-10-CM | POA: Diagnosis not present

## 2018-04-13 DIAGNOSIS — G301 Alzheimer's disease with late onset: Secondary | ICD-10-CM | POA: Diagnosis not present

## 2018-04-13 DIAGNOSIS — R600 Localized edema: Secondary | ICD-10-CM | POA: Diagnosis not present

## 2018-04-13 DIAGNOSIS — E0590 Thyrotoxicosis, unspecified without thyrotoxic crisis or storm: Secondary | ICD-10-CM | POA: Diagnosis not present

## 2018-04-13 DIAGNOSIS — B351 Tinea unguium: Secondary | ICD-10-CM | POA: Diagnosis not present

## 2018-04-13 DIAGNOSIS — I10 Essential (primary) hypertension: Secondary | ICD-10-CM | POA: Diagnosis not present

## 2018-04-13 DIAGNOSIS — F0281 Dementia in other diseases classified elsewhere with behavioral disturbance: Secondary | ICD-10-CM | POA: Diagnosis not present

## 2018-04-13 DIAGNOSIS — M79674 Pain in right toe(s): Secondary | ICD-10-CM | POA: Diagnosis not present

## 2018-04-13 DIAGNOSIS — F3489 Other specified persistent mood disorders: Secondary | ICD-10-CM | POA: Diagnosis not present

## 2018-04-19 DIAGNOSIS — F419 Anxiety disorder, unspecified: Secondary | ICD-10-CM | POA: Diagnosis not present

## 2018-04-19 DIAGNOSIS — I10 Essential (primary) hypertension: Secondary | ICD-10-CM | POA: Diagnosis not present

## 2018-04-19 DIAGNOSIS — F0281 Dementia in other diseases classified elsewhere with behavioral disturbance: Secondary | ICD-10-CM | POA: Diagnosis not present

## 2018-04-19 DIAGNOSIS — E0590 Thyrotoxicosis, unspecified without thyrotoxic crisis or storm: Secondary | ICD-10-CM | POA: Diagnosis not present

## 2018-04-21 DIAGNOSIS — Z79899 Other long term (current) drug therapy: Secondary | ICD-10-CM | POA: Diagnosis not present

## 2018-05-03 DIAGNOSIS — I10 Essential (primary) hypertension: Secondary | ICD-10-CM | POA: Diagnosis not present

## 2018-05-03 DIAGNOSIS — F0281 Dementia in other diseases classified elsewhere with behavioral disturbance: Secondary | ICD-10-CM | POA: Diagnosis not present

## 2018-05-03 DIAGNOSIS — E0590 Thyrotoxicosis, unspecified without thyrotoxic crisis or storm: Secondary | ICD-10-CM | POA: Diagnosis not present

## 2018-05-03 DIAGNOSIS — F419 Anxiety disorder, unspecified: Secondary | ICD-10-CM | POA: Diagnosis not present

## 2018-05-12 DIAGNOSIS — D649 Anemia, unspecified: Secondary | ICD-10-CM | POA: Diagnosis not present

## 2018-05-12 DIAGNOSIS — Z79899 Other long term (current) drug therapy: Secondary | ICD-10-CM | POA: Diagnosis not present

## 2018-05-12 DIAGNOSIS — E039 Hypothyroidism, unspecified: Secondary | ICD-10-CM | POA: Diagnosis not present

## 2018-05-17 DIAGNOSIS — F339 Major depressive disorder, recurrent, unspecified: Secondary | ICD-10-CM | POA: Diagnosis not present

## 2018-05-17 DIAGNOSIS — F419 Anxiety disorder, unspecified: Secondary | ICD-10-CM | POA: Diagnosis not present

## 2018-05-17 DIAGNOSIS — E034 Atrophy of thyroid (acquired): Secondary | ICD-10-CM | POA: Diagnosis not present

## 2018-05-17 DIAGNOSIS — F0281 Dementia in other diseases classified elsewhere with behavioral disturbance: Secondary | ICD-10-CM | POA: Diagnosis not present

## 2018-06-07 ENCOUNTER — Encounter (HOSPITAL_COMMUNITY): Payer: Self-pay | Admitting: Emergency Medicine

## 2018-06-07 ENCOUNTER — Emergency Department (HOSPITAL_COMMUNITY): Payer: Medicare Other

## 2018-06-07 ENCOUNTER — Other Ambulatory Visit: Payer: Self-pay

## 2018-06-07 ENCOUNTER — Emergency Department (HOSPITAL_COMMUNITY)
Admission: EM | Admit: 2018-06-07 | Discharge: 2018-06-07 | Disposition: A | Discharge status: Home / Self Care | Payer: Medicare Other | Attending: Emergency Medicine | Admitting: Emergency Medicine

## 2018-06-07 DIAGNOSIS — S40922A Unspecified superficial injury of left upper arm, initial encounter: Secondary | ICD-10-CM | POA: Diagnosis present

## 2018-06-07 DIAGNOSIS — S42255A Nondisplaced fracture of greater tuberosity of left humerus, initial encounter for closed fracture: Secondary | ICD-10-CM | POA: Diagnosis not present

## 2018-06-07 DIAGNOSIS — L03116 Cellulitis of left lower limb: Secondary | ICD-10-CM | POA: Insufficient documentation

## 2018-06-07 DIAGNOSIS — Z743 Need for continuous supervision: Secondary | ICD-10-CM | POA: Diagnosis not present

## 2018-06-07 DIAGNOSIS — Z207 Contact with and (suspected) exposure to pediculosis, acariasis and other infestations: Secondary | ICD-10-CM | POA: Diagnosis not present

## 2018-06-07 DIAGNOSIS — R402441 Other coma, without documented Glasgow coma scale score, or with partial score reported, in the field [EMT or ambulance]: Secondary | ICD-10-CM | POA: Diagnosis not present

## 2018-06-07 DIAGNOSIS — Y92129 Unspecified place in nursing home as the place of occurrence of the external cause: Secondary | ICD-10-CM | POA: Insufficient documentation

## 2018-06-07 DIAGNOSIS — Y939 Activity, unspecified: Secondary | ICD-10-CM | POA: Insufficient documentation

## 2018-06-07 DIAGNOSIS — E034 Atrophy of thyroid (acquired): Secondary | ICD-10-CM | POA: Diagnosis not present

## 2018-06-07 DIAGNOSIS — Z79899 Other long term (current) drug therapy: Secondary | ICD-10-CM | POA: Insufficient documentation

## 2018-06-07 DIAGNOSIS — W19XXXA Unspecified fall, initial encounter: Secondary | ICD-10-CM | POA: Diagnosis not present

## 2018-06-07 DIAGNOSIS — I10 Essential (primary) hypertension: Secondary | ICD-10-CM | POA: Diagnosis not present

## 2018-06-07 DIAGNOSIS — Z7689 Persons encountering health services in other specified circumstances: Secondary | ICD-10-CM | POA: Diagnosis not present

## 2018-06-07 DIAGNOSIS — S4992XA Unspecified injury of left shoulder and upper arm, initial encounter: Secondary | ICD-10-CM | POA: Diagnosis not present

## 2018-06-07 DIAGNOSIS — Y999 Unspecified external cause status: Secondary | ICD-10-CM | POA: Diagnosis not present

## 2018-06-07 DIAGNOSIS — S42302A Unspecified fracture of shaft of humerus, left arm, initial encounter for closed fracture: Secondary | ICD-10-CM | POA: Diagnosis not present

## 2018-06-07 DIAGNOSIS — S40022A Contusion of left upper arm, initial encounter: Secondary | ICD-10-CM | POA: Diagnosis not present

## 2018-06-07 DIAGNOSIS — M25512 Pain in left shoulder: Secondary | ICD-10-CM | POA: Diagnosis not present

## 2018-06-07 DIAGNOSIS — M79602 Pain in left arm: Secondary | ICD-10-CM | POA: Diagnosis not present

## 2018-06-07 DIAGNOSIS — F339 Major depressive disorder, recurrent, unspecified: Secondary | ICD-10-CM | POA: Diagnosis not present

## 2018-06-07 DIAGNOSIS — R279 Unspecified lack of coordination: Secondary | ICD-10-CM | POA: Diagnosis not present

## 2018-06-07 DIAGNOSIS — R296 Repeated falls: Secondary | ICD-10-CM | POA: Diagnosis not present

## 2018-06-07 HISTORY — DX: Unspecified dementia, unspecified severity, without behavioral disturbance, psychotic disturbance, mood disturbance, and anxiety: F03.90

## 2018-06-07 MED ORDER — CEPHALEXIN 500 MG PO CAPS: 500.0000 mg | ORAL_CAPSULE | Freq: Four times a day (QID) | ORAL | 0 refills | Status: DC

## 2018-06-07 MED ORDER — CEPHALEXIN 500 MG PO CAPS
500.0000 mg | ORAL_CAPSULE | Freq: Once | ORAL | Status: AC
  Administered 2018-06-07: 500 mg via ORAL
  Filled 2018-06-07: qty 1

## 2018-06-07 MED ORDER — ACETAMINOPHEN 325 MG PO TABS: 650.0000 mg | ORAL_TABLET | Freq: Four times a day (QID) | ORAL | 0 refills | Status: AC | PRN

## 2018-06-07 NOTE — ED Notes (Signed)
[facility] called for transport back to [facility]

## 2018-06-07 NOTE — ED Provider Notes (Signed)
  Physical Exam  BP (!) 175/74   Pulse 88   Temp 97.7 F (36.5 C)   Resp 18   SpO2 96%   Assumed care from Dr. Florina Ou at 0700. Briefly, the patient is a 84 y.o. female with PMHx of  has a past medical history of Dementia, Glaucoma, HYPERLIPIDEMIA (11/23/2007), HYPERTENSION (11/23/2007), HYPOTHYROIDISM (11/23/2007), LOW BACK PAIN (11/23/2007), Macular degeneration, OSTEOARTHRITIS (11/23/2007), and PERIPHERAL VASCULAR DISEASE (07/11/2008). here with fall from standing.  Patient complained at her nursing care facility of pain to the left shoulder. Patient unable to provide significant history due to dementia.   Labs Reviewed - No data to display  Course of Care:    Physical Exam  Constitutional: She appears well-developed and well-nourished. No distress.  Confused, and not well easily redirectable.  HENT:  Head: Normocephalic and atraumatic.  Eyes: Conjunctivae are normal. Right eye exhibits no discharge. Left eye exhibits no discharge.  EOMs normal to gross examination.  Neck: Normal range of motion.  Cardiovascular: Normal rate and regular rhythm.  Intact, 2+ radial pulse of LUE.  Pulmonary/Chest:  Normal respiratory effort. Patient converses comfortably. No audible wheeze or stridor.  Abdominal: She exhibits no distension.  Musculoskeletal:  There is ecchymosis superior to the left.  Patient is resistant to range of motion of the left shoulder.  Patient has tenderness to palpation over the greater tuberosity of humerus.  No deformity of midshaft of left humerus.  2+ radial pulse of left upper extremity, capillary refill <2s.  Sensation intact distally.  Neurological: She is alert.  Cranial nerves intact to gross observation. Patient moves extremities without difficulty.  Skin: Skin is warm and dry. She is not diaphoretic.  Psychiatric: She has a normal mood and affect. Her behavior is normal. Judgment and thought content normal.  Nursing note and vitals reviewed.   ED  Course/Procedures     Procedures  MDM   Per plan with Dr. Florina Ou, patient is receive plain film of left humerus and if negative for fracture, patietn may return home.   Patient is well-appearing and in no acute distress.  Patient with mild tenderness to palpation of the left humeral head.  Per radiology, there is a questionable nondisplaced fracture of the greater tuberosity.  Patient to be placed in a shoulder immobilizer, and  patient to follow-up with general orthopedics.  Provided information for nursing care facility to assist patient with making appointment.  Patient neurovascularly intact in the left upper extremity.       Tamala Julian 06/07/18 0858    Shanon Rosser, MD 06/07/18 2135

## 2018-06-07 NOTE — ED Triage Notes (Addendum)
Pt was found by staff laying on her left side. Pt guarding left arm, hematoma present on left elbow. Left leg swollen. Pt has slight dementia and poor vision. Pt at baseline per facility staff report.

## 2018-06-07 NOTE — ED Notes (Signed)
Bed: [facility] Expected date:  Expected time:  Means of arrival:  Comments: 

## 2018-06-07 NOTE — ED Notes (Signed)
Ambulated to restroom with one assist.

## 2018-06-07 NOTE — ED Notes (Signed)
DG Humerus left accidentally marked as completed

## 2018-06-07 NOTE — ED Notes (Signed)
Bed: WA21 Expected date:  Expected time:  Means of arrival:  Comments: 11F fall from SNF

## 2018-06-07 NOTE — ED Provider Notes (Signed)
[facility] DEPT Provider Note: Georgena Spurling, MD, FACEP  CSN: [id number] MRN: [id number] ARRIVAL: 06/07/18 at 0600 ROOM: [facility]   CHIEF COMPLAINT  Fall  Level 5 caveat: Dementia HISTORY OF PRESENT ILLNESS  06/07/18 6:16 AM Lisa [name] is a 84 y.o. female with dementia who was found on the floor of her living facility just prior to arrival after a presumed unwitnessed fall.  She has a hematoma to her left elbow and is complaining of pain in her left upper arm.  There is no deformity noted.  She was noted to have edema of her lower legs, left greater than right, with some tenderness and erythema of her anterior left lower leg.  She has chronic changes of her eyes bilaterally which staff reports are unchanged.  She is not on anticoagulation.  She is at her baseline mental status per staff.   Past Medical History:  Diagnosis Date  . Dementia   . Glaucoma   . HYPERLIPIDEMIA 11/23/2007  . HYPERTENSION 11/23/2007  . HYPOTHYROIDISM 11/23/2007  . LOW BACK PAIN 11/23/2007  . Macular degeneration   . OSTEOARTHRITIS 11/23/2007  . PERIPHERAL VASCULAR DISEASE 07/11/2008    Past Surgical History:  Procedure Laterality Date  . LUMBAR FUSION    . TOTAL KNEE ARTHROPLASTY      Family History  Problem Relation Age of Onset  . Hyperlipidemia Mother   . Diabetes Father   . Hypertension Other     Social History   Tobacco Use  . Smoking status: Never Smoker  . Smokeless tobacco: Never Used  Substance Use Topics  . Alcohol use: No  . Drug use: No    Prior to Admission medications   Medication Sig Start Date End Date Taking? Authorizing Provider  acetaminophen (TYLENOL) 500 MG tablet Take 500 mg by mouth every 4 (four) hours as needed (For headache, minor discomfort and fever (99.5-101F)). Not to exceed 2000 mg per 24 hours    Yes [provider]  alum & mag hydroxide-simeth (Nichols) 200-200-20 MG/5ML suspension Take 30 mLs by mouth every 6 (six) hours as needed for  indigestion or heartburn (Do not exceed 4 doses in 24 hours.).    Yes [provider]  amLODipine (NORVASC) 5 MG tablet Take 1 tablet (5 mg total) by mouth daily. Patient taking differently: Take 7.5 mg by mouth daily.  08/24/17  Yes Plotnikov, Evie Lacks, MD  Cholecalciferol (VITAMIN D3) 1000 UNITS CAPS Take 1 capsule by mouth daily. 09/16/11  Yes Plotnikov, Evie Lacks, MD  guaifenesin (ROBITUSSIN) 100 MG/5ML syrup Take 200 mg by mouth every 6 (six) hours as needed for cough. Not to exceed 4 doses in 24 hours   Yes [provider]  levothyroxine (SYNTHROID, LEVOTHROID) 25 MCG tablet Take 25 mcg by mouth daily before breakfast.   Yes [provider]  loperamide (IMODIUM) 2 MG capsule Take 2 mg by mouth as needed for diarrhea or loose stools (Do not exceed 8 doses in 24 hours.).    Yes [provider]  losartan (COZAAR) 100 MG tablet TAKE 1 TABLET BY MOUTH EVERY DAY 09/15/17  Yes Plotnikov, Evie Lacks, MD  Loteprednol Etabonate (LOTEMAX) 0.5 % OINT Place 1 strip into both eyes 2 (two) times daily.   Yes [provider]  magnesium hydroxide (MILK OF MAGNESIA) 400 MG/5ML suspension Take 30 mLs by mouth at bedtime as needed for mild constipation.   Yes [provider]  memantine (NAMENDA XR) 14 MG CP24 24 hr capsule Take 14  mg by mouth daily.    Yes [provider]  Menthol, Topical Analgesic, (BIOFREEZE) 4 % GEL Apply 1 application topically 3 (three) times daily as needed (left hip pain).   Yes [provider]  neomycin-bacitracin-polymyxin (NEOSPORIN) ointment Apply 1 application topically as needed for wound care.   Yes [provider]  nystatin-triamcinolone (MYCOLOG II) cream Apply 1 application topically at bedtime. To lower extremities after removing TED hose   Yes [provider]  OVER THE COUNTER MEDICATION Take 1 Can by mouth 4 (four) times daily. House Shake   Yes [provider]  OVER THE COUNTER  MEDICATION Take 1 Can by mouth 4 (four) times daily.   Yes [provider]  Travoprost, BAK Free, (TRAVATAN Z) 0.004 % SOLN ophthalmic solution Place 1-2 drops into both eyes 2 (two) times daily. Instill two drops in the morning and one drop at bedtime.   Yes [provider]  traZODone (DESYREL) 50 MG tablet Take 1-2 tablets (50-100 mg total) by mouth at bedtime. Patient taking differently: Take 50 mg by mouth at bedtime.  10/09/17  Yes Plotnikov, Evie Lacks, MD  acetaminophen (TYLENOL) 325 MG tablet Take 2 tablets (650 mg total) by mouth every 6 (six) hours as needed for moderate pain. 06/07/18   Langston Masker B, PA-C  cephALEXin (KEFLEX) 500 MG capsule Take 1 capsule (500 mg total) by mouth 4 (four) times daily. 06/07/18   Gerado Nabers, MD    Allergies Aricept Reather Littler hydrochloride]   REVIEW OF SYSTEMS     PHYSICAL EXAMINATION  Initial Vital Signs Blood pressure (!) 172/67, pulse 80, temperature 97.7 F (36.5 C), resp. rate 18, SpO2 96 %.  Examination General: Well-developed, well-nourished female in no acute distress; appearance consistent with age of record HENT: normocephalic; atraumatic; no scalp hematoma seen or palpated Eyes: Irregular pupils; chronic appearing ectropion and conjunctival edema, erythema and exudate:    Neck: supple; nontender Heart: regular rate and rhythm Lungs: clear to auscultation bilaterally Abdomen: soft; nondistended; nontender; bowel sounds present Extremities: Arthritic changes; no pain on passive range of motion of joints of upper and lower extremities; tenderness and erythema of anterior left lower leg; tenderness of left upper arm with subacute-appearing purpura and blood-filled bulla of the left elbow:       Neurologic: Awake, alert, oriented to person; motor function intact in all extremities and symmetric; no facial droop Skin: Warm and dry Psychiatric: Normal mood and affect   RESULTS  Summary of this visit's  results, reviewed by myself:   EKG Interpretation  Date/Time:    Ventricular Rate:    PR Interval:    QRS Duration:   QT Interval:    QTC Calculation:   R Axis:     Text Interpretation:        Laboratory Studies: No results found for this or any previous visit (from the past 24 hour(s)). Imaging Studies: Dg Shoulder Left  Result Date: 06/07/2018 CLINICAL DATA:  84 year old female with pain after fall EXAM: LEFT SHOULDER - 2+ VIEW COMPARISON:  06/07/2018 FINDINGS: Irregularity of the humeral head at the greater tuberosity, similar to the comparison study. No significant soft tissue swelling. Glenohumeral joint appears congruent. No displaced fracture of the distal clavicle/acromion. IMPRESSION: Irregularity of the left humerus greater tuberosity favored to represent fracture, particularly if the patient has a history of trauma and tenderness at this site. Electronically Signed   By: Corrie Mckusick D.O.   On: 06/07/2018 08:20   Dg Humerus Left  Result Date: 06/07/2018 CLINICAL DATA:  Status post unwitnessed fall now with guarding of the left arm and hematoma over the elbow. EXAM: LEFT HUMERUS - 2+ VIEW COMPARISON:  Left wrist series of June 03, 2017 FINDINGS: The humerus is subjectively adequately mineralized. The glenohumeral joint space is mildly narrowed. There is some irregularity of the contour of the lateral aspect of the head of the humerus on one view which could reflect an impacted fracture. The limited visualization of the elbow exhibits no acute abnormality. The soft tissues of the arm are unremarkable. IMPRESSION: Possible impacted humeral head fracture laterally. Elsewhere the humerus is unremarkable. A dedicated shoulder series is recommended. Electronically Signed   By: David  Martinique M.D.   On: 06/07/2018 07:37    ED COURSE and MDM  Nursing notes and initial vitals signs, including pulse oximetry, reviewed.  Vitals:   06/07/18 0615 06/07/18 0741 06/07/18 0815 06/07/18 0820    BP: (!) 172/67 (!) 166/74  (!) 175/74  Pulse: 80 83 88 88  Resp: 18     Temp: 97.7 F (36.5 C)     SpO2: 96% 96% 97% 96%   We will treat for cellulitis of the left lower leg as she has erythema and tenderness of the soft tissue without bony tenderness of the left ankle joint.   PROCEDURES    ED DIAGNOSES     ICD-10-CM   1. Closed nondisplaced fracture of greater tuberosity of left humerus, initial encounter S42.255A   2. Fall at nursing home, initial encounter W19.XXXA    Y92.129   3. Contusion of left upper arm, initial encounter S40.022A   4. Cellulitis of left anterior lower leg [id number]        Crespin Forstrom, MD 06/07/18 2136

## 2018-06-07 NOTE — Discharge Instructions (Addendum)
Lisa [name] appears to have a small fracture in the outer bone of the shoulder called the humerus.  This is treated with immobilization and follow-up with an orthopedic physician to make sure it heals well.  Please make sure that she keeps the shoulder immobilized at all times with the exception of bathing.  Please assist [name]  and making an appointment with the orthopedic provider listed on this paperwork.  We also noted today that she has signs of early infection in her left lower leg.  We will treat her with a medication called Keflex.  She is given a prescription here in the emergency department.  Please ensure that she finishes the entire course of antibiotics.  Should she develop any increase in swelling of the left upper extremity, complaining of increasing pain, weakness, or discoloration of the fingers, please have her reevaluated.  Thank you for allowing [place] to participate in your care today.

## 2018-06-14 DIAGNOSIS — M25561 Pain in right knee: Secondary | ICD-10-CM | POA: Diagnosis not present

## 2018-06-14 DIAGNOSIS — M545 Low back pain: Secondary | ICD-10-CM | POA: Diagnosis not present

## 2018-06-16 DIAGNOSIS — Z79899 Other long term (current) drug therapy: Secondary | ICD-10-CM | POA: Diagnosis not present

## 2018-06-16 DIAGNOSIS — E039 Hypothyroidism, unspecified: Secondary | ICD-10-CM | POA: Diagnosis not present

## 2018-06-16 DIAGNOSIS — M79675 Pain in left toe(s): Secondary | ICD-10-CM | POA: Diagnosis not present

## 2018-06-16 DIAGNOSIS — B351 Tinea unguium: Secondary | ICD-10-CM | POA: Diagnosis not present

## 2018-06-21 DIAGNOSIS — E034 Atrophy of thyroid (acquired): Secondary | ICD-10-CM | POA: Diagnosis not present

## 2018-06-21 DIAGNOSIS — R296 Repeated falls: Secondary | ICD-10-CM | POA: Diagnosis not present

## 2018-06-21 DIAGNOSIS — S42254A Nondisplaced fracture of greater tuberosity of right humerus, initial encounter for closed fracture: Secondary | ICD-10-CM | POA: Diagnosis not present

## 2018-06-29 DIAGNOSIS — M25512 Pain in left shoulder: Secondary | ICD-10-CM | POA: Diagnosis not present

## 2018-07-01 DIAGNOSIS — Z79899 Other long term (current) drug therapy: Secondary | ICD-10-CM | POA: Diagnosis not present

## 2018-07-01 DIAGNOSIS — E039 Hypothyroidism, unspecified: Secondary | ICD-10-CM | POA: Diagnosis not present

## 2018-07-03 ENCOUNTER — Other Ambulatory Visit: Payer: Self-pay

## 2018-07-03 ENCOUNTER — Encounter (HOSPITAL_COMMUNITY): Payer: Self-pay | Admitting: Emergency Medicine

## 2018-07-03 ENCOUNTER — Emergency Department (HOSPITAL_COMMUNITY): Payer: Medicare Other

## 2018-07-03 ENCOUNTER — Emergency Department (HOSPITAL_COMMUNITY)
Admission: EM | Admit: 2018-07-03 | Discharge: 2018-07-03 | Disposition: A | Discharge status: Home / Self Care | Payer: Medicare Other | Attending: Emergency Medicine | Admitting: Emergency Medicine

## 2018-07-03 DIAGNOSIS — Y939 Activity, unspecified: Secondary | ICD-10-CM | POA: Diagnosis not present

## 2018-07-03 DIAGNOSIS — M25552 Pain in left hip: Secondary | ICD-10-CM | POA: Diagnosis not present

## 2018-07-03 DIAGNOSIS — Z96643 Presence of artificial hip joint, bilateral: Secondary | ICD-10-CM | POA: Diagnosis not present

## 2018-07-03 DIAGNOSIS — I10 Essential (primary) hypertension: Secondary | ICD-10-CM | POA: Diagnosis not present

## 2018-07-03 DIAGNOSIS — M79651 Pain in right thigh: Secondary | ICD-10-CM | POA: Diagnosis not present

## 2018-07-03 DIAGNOSIS — M2550 Pain in unspecified joint: Secondary | ICD-10-CM | POA: Diagnosis not present

## 2018-07-03 DIAGNOSIS — S199XXA Unspecified injury of neck, initial encounter: Secondary | ICD-10-CM | POA: Diagnosis not present

## 2018-07-03 DIAGNOSIS — R0902 Hypoxemia: Secondary | ICD-10-CM | POA: Diagnosis not present

## 2018-07-03 DIAGNOSIS — S79921A Unspecified injury of right thigh, initial encounter: Secondary | ICD-10-CM | POA: Diagnosis not present

## 2018-07-03 DIAGNOSIS — S0993XA Unspecified injury of face, initial encounter: Secondary | ICD-10-CM | POA: Insufficient documentation

## 2018-07-03 DIAGNOSIS — G309 Alzheimer's disease, unspecified: Secondary | ICD-10-CM | POA: Diagnosis not present

## 2018-07-03 DIAGNOSIS — Y92129 Unspecified place in nursing home as the place of occurrence of the external cause: Secondary | ICD-10-CM | POA: Insufficient documentation

## 2018-07-03 DIAGNOSIS — Z7401 Bed confinement status: Secondary | ICD-10-CM | POA: Diagnosis not present

## 2018-07-03 DIAGNOSIS — Y999 Unspecified external cause status: Secondary | ICD-10-CM | POA: Insufficient documentation

## 2018-07-03 DIAGNOSIS — S299XXA Unspecified injury of thorax, initial encounter: Secondary | ICD-10-CM | POA: Diagnosis not present

## 2018-07-03 DIAGNOSIS — R55 Syncope and collapse: Secondary | ICD-10-CM | POA: Diagnosis not present

## 2018-07-03 DIAGNOSIS — M25551 Pain in right hip: Secondary | ICD-10-CM | POA: Diagnosis not present

## 2018-07-03 DIAGNOSIS — E039 Hypothyroidism, unspecified: Secondary | ICD-10-CM | POA: Diagnosis not present

## 2018-07-03 DIAGNOSIS — D485 Neoplasm of uncertain behavior of skin: Secondary | ICD-10-CM | POA: Insufficient documentation

## 2018-07-03 DIAGNOSIS — I959 Hypotension, unspecified: Secondary | ICD-10-CM | POA: Diagnosis not present

## 2018-07-03 DIAGNOSIS — Z96651 Presence of right artificial knee joint: Secondary | ICD-10-CM | POA: Diagnosis not present

## 2018-07-03 DIAGNOSIS — Z79899 Other long term (current) drug therapy: Secondary | ICD-10-CM | POA: Insufficient documentation

## 2018-07-03 DIAGNOSIS — F0280 Dementia in other diseases classified elsewhere without behavioral disturbance: Secondary | ICD-10-CM | POA: Diagnosis not present

## 2018-07-03 DIAGNOSIS — I4891 Unspecified atrial fibrillation: Secondary | ICD-10-CM | POA: Diagnosis not present

## 2018-07-03 DIAGNOSIS — W19XXXA Unspecified fall, initial encounter: Secondary | ICD-10-CM | POA: Diagnosis not present

## 2018-07-03 DIAGNOSIS — F0390 Unspecified dementia without behavioral disturbance: Secondary | ICD-10-CM | POA: Diagnosis not present

## 2018-07-03 DIAGNOSIS — S0990XA Unspecified injury of head, initial encounter: Secondary | ICD-10-CM | POA: Diagnosis not present

## 2018-07-03 DIAGNOSIS — R404 Transient alteration of awareness: Secondary | ICD-10-CM | POA: Diagnosis not present

## 2018-07-03 DIAGNOSIS — S3993XA Unspecified injury of pelvis, initial encounter: Secondary | ICD-10-CM | POA: Diagnosis not present

## 2018-07-03 LAB — COMPREHENSIVE METABOLIC PANEL
ALK PHOS: 112 U/L (ref 38–126)
ALT: 22 U/L (ref 0–44)
ANION GAP: 10 (ref 5–15)
AST: 26 U/L (ref 15–41)
Albumin: 3 g/dL — ABNORMAL LOW (ref 3.5–5.0)
BILIRUBIN TOTAL: 0.7 mg/dL (ref 0.3–1.2)
BUN: 16 mg/dL (ref 8–23)
CALCIUM: 9.5 mg/dL (ref 8.9–10.3)
CO2: 30 mmol/L (ref 22–32)
CREATININE: 0.61 mg/dL (ref 0.44–1.00)
Chloride: 104 mmol/L (ref 98–111)
Glucose, Bld: 106 mg/dL — ABNORMAL HIGH (ref 70–99)
Potassium: 3.5 mmol/L (ref 3.5–5.1)
SODIUM: 144 mmol/L (ref 135–145)
Total Protein: 6.2 g/dL — ABNORMAL LOW (ref 6.5–8.1)

## 2018-07-03 LAB — CBC WITH DIFFERENTIAL/PLATELET
ABS IMMATURE GRANULOCYTES: 0 10*3/uL (ref 0.0–0.1)
BASOS ABS: 0 10*3/uL (ref 0.0–0.1)
Basophils Relative: 0 %
EOS PCT: 3 %
Eosinophils Absolute: 0.2 10*3/uL (ref 0.0–0.7)
HCT: 34.5 % — ABNORMAL LOW (ref 36.0–46.0)
HEMOGLOBIN: 11.1 g/dL — AB (ref 12.0–15.0)
Immature Granulocytes: 0 %
LYMPHS PCT: 21 %
Lymphs Abs: 1.3 10*3/uL (ref 0.7–4.0)
MCH: 29.4 pg (ref 26.0–34.0)
MCHC: 32.2 g/dL (ref 30.0–36.0)
MCV: 91.3 fL (ref 78.0–100.0)
MONO ABS: 0.7 10*3/uL (ref 0.1–1.0)
Monocytes Relative: 11 %
Neutro Abs: 4 10*3/uL (ref 1.7–7.7)
Neutrophils Relative %: 65 %
Platelets: 324 10*3/uL (ref 150–400)
RBC: 3.78 MIL/uL — ABNORMAL LOW (ref 3.87–5.11)
RDW: 13.2 % (ref 11.5–15.5)
WBC: 6.2 10*3/uL (ref 4.0–10.5)

## 2018-07-03 LAB — URINALYSIS, ROUTINE W REFLEX MICROSCOPIC
BILIRUBIN URINE: NEGATIVE
Glucose, UA: NEGATIVE mg/dL
Hgb urine dipstick: NEGATIVE
KETONES UR: NEGATIVE mg/dL
LEUKOCYTES UA: NEGATIVE
NITRITE: NEGATIVE
PH: 7 (ref 5.0–8.0)
PROTEIN: NEGATIVE mg/dL
Specific Gravity, Urine: 1.009 (ref 1.005–1.030)

## 2018-07-03 MED ORDER — ERYTHROMYCIN 5 MG/GM OP OINT: TOPICAL_OINTMENT | OPHTHALMIC | 0 refills | Status: DC

## 2018-07-03 NOTE — ED Notes (Signed)
[facility] has arrived - transporting pt back to [facility]. Pt dressed into hospital gown - all of pt's belongings in belongings bag including pillow remains w/pt - given to [facility].

## 2018-07-03 NOTE — ED Notes (Signed)
Attempted to draw blood off of IV unsuccessfully.

## 2018-07-03 NOTE — ED Triage Notes (Signed)
Patient arrived to ED via [facility] from [facility]. EMS reports:  Patient experienced unwitnessed fall this morning approx 1000. Denies LOC or pain. Patient has dementia. Does not remember fall.  Patient has previous clavicle fracture from 1 month ago, but will not wear sling.  Pain only when palpated in bilateral hip areas. Towel C-collar in place.  Hx - Dementia, HTN, Anxiety A fib on monitor. BP 164/50, Pulse 93, 94% on room air. CBG 157.

## 2018-07-03 NOTE — ED Provider Notes (Signed)
[facility] EMERGENCY DEPARTMENT Provider Note   CSN: [id number] Arrival date & time: 07/03/18  1045     History   Chief Complaint Chief Complaint  Patient presents with  . Fall    HPI Kristiana Jacko is a 84 y.o. female.   Fall  This is a new problem. Episode onset: unsure. The problem occurs constantly. The problem has not changed since onset.Pertinent negatives include no chest pain. Nothing aggravates the symptoms. Nothing relieves the symptoms.    Past Medical History:  Diagnosis Date  . Dementia   . Glaucoma   . HYPERLIPIDEMIA 11/23/2007  . HYPERTENSION 11/23/2007  . HYPOTHYROIDISM 11/23/2007  . LOW BACK PAIN 11/23/2007  . Macular degeneration   . OSTEOARTHRITIS 11/23/2007  . PERIPHERAL VASCULAR DISEASE 07/11/2008    Patient Active Problem List   Diagnosis Date Noted  . Left wrist injury, initial encounter 06/03/2017  . Distal radius fracture, left 06/03/2017  . Depression 09/02/2016  . Well adult exam 08/31/2014  . Fatigue 07/22/2011  . Syncopal episodes 06/25/2011  . Hyponatremia 06/25/2011  . Alzheimer disease 06/25/2011  . DIARRHEA 08/01/2008  . PERIPHERAL VASCULAR DISEASE 07/11/2008  . NEOPLASM OF UNCERTAIN BEHAVIOR OF SKIN 05/23/2008  . OTHER NONTHROMBOCYTOPENIC PURPURAS 05/23/2008  . MYALGIA 05/23/2008  . Hypothyroidism 11/23/2007  . HYPERLIPIDEMIA 11/23/2007  . Essential hypertension 11/23/2007  . OSTEOARTHRITIS 11/23/2007  . LOW BACK PAIN 11/23/2007    Past Surgical History:  Procedure Laterality Date  . LUMBAR FUSION    . TOTAL KNEE ARTHROPLASTY       OB History   None      Home Medications    Prior to Admission medications   Medication Sig Start Date End Date Taking? Authorizing Provider  acetaminophen (TYLENOL) 325 MG tablet Take 2 tablets (650 mg total) by mouth every 6 (six) hours as needed for moderate pain. 06/07/18  Yes Valere Dross, Alyssa B, PA-C  acetaminophen (TYLENOL) 500 MG tablet Take 500 mg by mouth every 4  (four) hours as needed (For headache, minor discomfort and fever (99.5-101F)). Not to exceed 2000 mg per 24 hours    Yes [provider]  alum & mag hydroxide-simeth (Oto) 200-200-20 MG/5ML suspension Take 30 mLs by mouth every 6 (six) hours as needed for indigestion or heartburn (Do not exceed 4 doses in 24 hours.).    Yes [provider]  amLODipine (NORVASC) 5 MG tablet Take 1 tablet (5 mg total) by mouth daily. Patient taking differently: Take 7.5 mg by mouth daily.  08/24/17  Yes Plotnikov, Evie Lacks, MD  Cholecalciferol (VITAMIN D3) 1000 UNITS CAPS Take 1 capsule by mouth daily. Patient taking differently: Take 1,000 Units by mouth daily.  09/16/11  Yes Plotnikov, Evie Lacks, MD  escitalopram (LEXAPRO) 10 MG tablet Take 10 mg by mouth daily.   Yes [provider]  guaifenesin (ROBITUSSIN) 100 MG/5ML syrup Take 200 mg by mouth every 6 (six) hours as needed for cough. Not to exceed 4 doses in 24 hours   Yes [provider]  levothyroxine (SYNTHROID, LEVOTHROID) 25 MCG tablet Take 25 mcg by mouth daily before breakfast.   Yes [provider]  loperamide (IMODIUM) 2 MG capsule Take 2 mg by mouth as needed for diarrhea or loose stools (Do not exceed 8 doses in 24 hours.).    Yes [provider]  losartan (COZAAR) 100 MG tablet TAKE 1 TABLET BY MOUTH EVERY DAY 09/15/17  Yes Plotnikov, Evie Lacks, MD  Loteprednol Etabonate (LOTEMAX) 0.5 %  OINT Place 1 strip into both eyes 2 (two) times daily.   Yes [provider]  magnesium hydroxide (MILK OF MAGNESIA) 400 MG/5ML suspension Take 30 mLs by mouth at bedtime as needed for mild constipation.   Yes [provider]  memantine (NAMENDA XR) 14 MG CP24 24 hr capsule Take 14 mg by mouth daily.    Yes [provider]  Menthol, Topical Analgesic, (BIOFREEZE) 4 % GEL Apply 1 application topically 3 (three) times daily as needed (left hip pain).   Yes [provider]    neomycin-bacitracin-polymyxin (NEOSPORIN) ointment Apply 1 application topically as needed for wound care.   Yes [provider]  nystatin-triamcinolone (MYCOLOG II) cream Apply 1 application topically at bedtime. To lower extremities after removing TED hose   Yes [provider]  OVER THE COUNTER MEDICATION Take 1 Can by mouth 4 (four) times daily. House Shake   Yes [provider]  Travoprost, BAK Free, (TRAVATAN Z) 0.004 % SOLN ophthalmic solution Place 2 drops into both eyes every morning.    Yes [provider]  traZODone (DESYREL) 50 MG tablet Take 1-2 tablets (50-100 mg total) by mouth at bedtime. Patient taking differently: Take 50 mg by mouth at bedtime.  10/09/17  Yes Plotnikov, Evie Lacks, MD  erythromycin ophthalmic ointment Place a 1/2 inch ribbon of ointment into the lower eyelid. 07/03/18   Kameren Pargas, Corene Cornea, MD    Family History Family History  Problem Relation Age of Onset  . Hyperlipidemia Mother   . Diabetes Father   . Hypertension Other     Social History Social History   Tobacco Use  . Smoking status: Never Smoker  . Smokeless tobacco: Never Used  Substance Use Topics  . Alcohol use: No  . Drug use: No     Allergies   Aricept [donepezil hydrochloride]   Review of Systems Review of Systems  Unable to perform ROS: Dementia  Cardiovascular: Negative for chest pain.     Physical Exam Updated Vital Signs BP (!) 163/67   Pulse 86   Temp 97.7 F (36.5 C) (Oral)   Resp (!) 21   SpO2 97%   Physical Exam  Constitutional: She is oriented to person, place, and time. She appears well-developed and well-nourished.  HENT:  Head: Normocephalic and atraumatic.  Eyes: Conjunctivae and EOM are normal.  Neck: Normal range of motion.  Cardiovascular: Normal rate and regular rhythm.  Pulmonary/Chest: No stridor. No respiratory distress.  Abdominal: Soft. Bowel sounds are normal. She exhibits no distension.  Musculoskeletal:  Normal range of motion. She exhibits no edema or deformity.  Neurological: She is alert and oriented to person, place, and time. No cranial nerve deficit. Coordination normal.  Skin: Skin is warm and dry.  Nursing note and vitals reviewed.    ED Treatments / Results  Labs (all labs ordered are listed, but only abnormal results are displayed) Labs Reviewed  CBC WITH DIFFERENTIAL/PLATELET - Abnormal; Notable for the following components:      Result Value   RBC 3.78 (*)    Hemoglobin 11.1 (*)    HCT 34.5 (*)    All other components within normal limits  COMPREHENSIVE METABOLIC PANEL - Abnormal; Notable for the following components:   Glucose, Bld 106 (*)    Total Protein 6.2 (*)    Albumin 3.0 (*)    All other components within normal limits  URINALYSIS, ROUTINE W REFLEX MICROSCOPIC - Abnormal; Notable for the following components:   Color, Urine  STRAW (*)    All other components within normal limits    EKG EKG Interpretation  Date/Time:  Saturday July 03 2018 13:51:14 EDT Ventricular Rate:  88 PR Interval:    QRS Duration: 96 QT Interval:  387 QTC Calculation: 469 R Axis:   29 Text Interpretation:  Sinus rhythm No significant change since last tracing Confirmed by Merrily Pew [phone]) on 07/03/2018 2:04:19 PM   Radiology Dg Chest 2 View  Result Date: 07/03/2018 CLINICAL DATA:  Unwitnessed fall this morning. EXAM: CHEST - 2 VIEW COMPARISON:  None. FINDINGS: Lungs are hypoinflated without focal lobar consolidation, effusion or pneumothorax. Subtle prominence of the perihilar markings likely due to the degree of hypoinflation. Cardiomediastinal silhouette is within normal. Mild degenerate change of the spine. IMPRESSION: Hypoinflation without acute disease. Electronically Signed   By: Marin Olp M.D.   On: 07/03/2018 13:35   Dg Pelvis 1-2 Views  Result Date: 07/03/2018 CLINICAL DATA:  Unwitnessed fall this morning with bilateral hip and right leg pain. EXAM: PELVIS - 1-2  VIEW COMPARISON:  06/14/2010 FINDINGS: Bilateral total hip arthroplasties intact and unchanged. No evidence of acute fracture or dislocation. Degenerative change of the spine. IMPRESSION: No acute findings. Bilateral hip arthroplasties unchanged. Electronically Signed   By: Marin Olp M.D.   On: 07/03/2018 13:38   Ct Head Wo Contrast  Result Date: 07/03/2018 CLINICAL DATA:  Facial trauma from an unwitnessed fall this morning. Dementia. EXAM: CT HEAD WITHOUT CONTRAST CT ORBITS WITHOUT CONTRAST CT CERVICAL SPINE WITHOUT CONTRAST TECHNIQUE: Multidetector CT imaging of the head, cervical spine, and orbital structures were performed using the standard protocol without intravenous contrast. Multiplanar CT image reconstructions of the cervical spine and maxillofacial structures were also generated. COMPARISON:  Head CT dated 02/21/2018. FINDINGS: CT HEAD FINDINGS Brain: Diffusely enlarged ventricles and subarachnoid spaces. Patchy white matter low density in both cerebral hemispheres. No intracranial hemorrhage, mass lesion or CT evidence of acute infarction. Vascular: No hyperdense vessel or unexpected calcification. Skull: Bilateral hyperostosis frontalis.  No skull fracture seen. Other: None. CT ORBITS FINDINGS Osseous: No fracture or mandibular dislocation. No destructive process. Orbits: Status post bilateral cataract extraction. No traumatic or inflammatory finding. Sinuses: Right sphenoid and maxillary sinus retention cysts. Soft tissues: Unremarkable. CT CERVICAL SPINE FINDINGS Alignment: Minimal anterolisthesis at the C3-4 and C4-5 levels. Minimal retrolisthesis at the C5-6 level and minimal anterolisthesis at the C6-7 level. Skull base and vertebrae: No acute fracture. No primary bone lesion or focal pathologic process. Soft tissues and spinal canal: No prevertebral fluid or swelling. No visible canal hematoma. Disc levels: Multilevel degenerative changes, including facet degenerative changes. Upper chest:  Clear lung apices. Other: None. IMPRESSION: 1. No skull fracture or intracranial hemorrhage. 2. No orbital fracture. 3. No cervical spine fracture or traumatic subluxation. 4. Stable diffuse cerebral and cerebellar atrophy. 5. Stable chronic small vessel white matter ischemic changes in both cerebral hemispheres. 6. Multilevel cervical spine degenerative changes with associated subluxations. Electronically Signed   By: Claudie Revering M.D.   On: 07/03/2018 13:24   Ct Cervical Spine Wo Contrast  Result Date: 07/03/2018 CLINICAL DATA:  Facial trauma from an unwitnessed fall this morning. Dementia. EXAM: CT HEAD WITHOUT CONTRAST CT ORBITS WITHOUT CONTRAST CT CERVICAL SPINE WITHOUT CONTRAST TECHNIQUE: Multidetector CT imaging of the head, cervical spine, and orbital structures were performed using the standard protocol without intravenous contrast. Multiplanar CT image reconstructions of the cervical spine and maxillofacial structures were also generated. COMPARISON:  Head CT dated 02/21/2018. FINDINGS: CT  HEAD FINDINGS Brain: Diffusely enlarged ventricles and subarachnoid spaces. Patchy white matter low density in both cerebral hemispheres. No intracranial hemorrhage, mass lesion or CT evidence of acute infarction. Vascular: No hyperdense vessel or unexpected calcification. Skull: Bilateral hyperostosis frontalis.  No skull fracture seen. Other: None. CT ORBITS FINDINGS Osseous: No fracture or mandibular dislocation. No destructive process. Orbits: Status post bilateral cataract extraction. No traumatic or inflammatory finding. Sinuses: Right sphenoid and maxillary sinus retention cysts. Soft tissues: Unremarkable. CT CERVICAL SPINE FINDINGS Alignment: Minimal anterolisthesis at the C3-4 and C4-5 levels. Minimal retrolisthesis at the C5-6 level and minimal anterolisthesis at the C6-7 level. Skull base and vertebrae: No acute fracture. No primary bone lesion or focal pathologic process. Soft tissues and spinal canal:  No prevertebral fluid or swelling. No visible canal hematoma. Disc levels: Multilevel degenerative changes, including facet degenerative changes. Upper chest: Clear lung apices. Other: None. IMPRESSION: 1. No skull fracture or intracranial hemorrhage. 2. No orbital fracture. 3. No cervical spine fracture or traumatic subluxation. 4. Stable diffuse cerebral and cerebellar atrophy. 5. Stable chronic small vessel white matter ischemic changes in both cerebral hemispheres. 6. Multilevel cervical spine degenerative changes with associated subluxations. Electronically Signed   By: Claudie Revering M.D.   On: 07/03/2018 13:24   Dg Femur Min 2 Views Right  Result Date: 07/03/2018 CLINICAL DATA:  Unwitnessed fall this morning. Bilateral hip pain and right leg pain. EXAM: RIGHT FEMUR 2 VIEWS COMPARISON:  06/14/2010 FINDINGS: Right total hip arthroplasty intact and unchanged. Right total knee arthroplasty intact. No evidence of acute fracture or dislocation. IMPRESSION: No acute findings. Electronically Signed   By: Marin Olp M.D.   On: 07/03/2018 13:37   Ct Orbits Wo Contrast  Result Date: 07/03/2018 CLINICAL DATA:  Facial trauma from an unwitnessed fall this morning. Dementia. EXAM: CT HEAD WITHOUT CONTRAST CT ORBITS WITHOUT CONTRAST CT CERVICAL SPINE WITHOUT CONTRAST TECHNIQUE: Multidetector CT imaging of the head, cervical spine, and orbital structures were performed using the standard protocol without intravenous contrast. Multiplanar CT image reconstructions of the cervical spine and maxillofacial structures were also generated. COMPARISON:  Head CT dated 02/21/2018. FINDINGS: CT HEAD FINDINGS Brain: Diffusely enlarged ventricles and subarachnoid spaces. Patchy white matter low density in both cerebral hemispheres. No intracranial hemorrhage, mass lesion or CT evidence of acute infarction. Vascular: No hyperdense vessel or unexpected calcification. Skull: Bilateral hyperostosis frontalis.  No skull fracture seen.  Other: None. CT ORBITS FINDINGS Osseous: No fracture or mandibular dislocation. No destructive process. Orbits: Status post bilateral cataract extraction. No traumatic or inflammatory finding. Sinuses: Right sphenoid and maxillary sinus retention cysts. Soft tissues: Unremarkable. CT CERVICAL SPINE FINDINGS Alignment: Minimal anterolisthesis at the C3-4 and C4-5 levels. Minimal retrolisthesis at the C5-6 level and minimal anterolisthesis at the C6-7 level. Skull base and vertebrae: No acute fracture. No primary bone lesion or focal pathologic process. Soft tissues and spinal canal: No prevertebral fluid or swelling. No visible canal hematoma. Disc levels: Multilevel degenerative changes, including facet degenerative changes. Upper chest: Clear lung apices. Other: None. IMPRESSION: 1. No skull fracture or intracranial hemorrhage. 2. No orbital fracture. 3. No cervical spine fracture or traumatic subluxation. 4. Stable diffuse cerebral and cerebellar atrophy. 5. Stable chronic small vessel white matter ischemic changes in both cerebral hemispheres. 6. Multilevel cervical spine degenerative changes with associated subluxations. Electronically Signed   By: Claudie Revering M.D.   On: 07/03/2018 13:24    Procedures Procedures (including critical care time)  Medications Ordered in ED Medications - No  data to display   Initial Impression / Assessment and Plan / ED Course  I have reviewed the triage vital signs and the nursing notes.  Pertinent labs & imaging results that were available during my care of the patient were reviewed by me and considered in my medical decision making (see chart for details).     Patient had a witnessed fall.  Work-up here from syncopal episode standpoint it looks good.  She has not CT scans done.  She did have a somewhat unequal pupil that appeared teardrop in shape but she had this previously on previous notes.  She had a subconjunctival hemorrhage.  Patient possibly have a dental  injury so we will start her on antibiotic ointment.  Rest of her work-up is negative.  No evidence of globe rupture this is likely postsurgical.  No evidence of broken bones and does not walk normally as she is in a wheelchair.  Feel like patient is stable for discharge back to facility.  Final Clinical Impressions(s) / ED Diagnoses   Final diagnoses:  Fall, initial encounter    ED Discharge Orders        Ordered    erythromycin ophthalmic ointment     07/03/18 1513       Julien Oscar, Corene Cornea, MD 07/03/18 1703

## 2018-07-03 NOTE — ED Notes (Addendum)
Pt arrived to [facility] via stretcher. Pt continuously yelling "Help" - when ask pt what she needs - pt asking same questions repeatedly. Pt voided in bedpan and new brief applied to pt.  Report called to Erle Crocker, [place] [phone]. [facility] aware of need for [facility] to transport pt back to facility. Advised may be prolonged time for transport.

## 2018-07-03 NOTE — ED Notes (Signed)
Heart Healthy Diet was ordered for Dinner. 

## 2018-07-03 NOTE — ED Notes (Signed)
Patient transported to CT 

## 2018-07-03 NOTE — ED Notes (Signed)
Pt on stretcher at nurses' desk d/t pt states she does not like being alone. Pt continues to ask same questions repeatedly - states she wants to go home. Advised pt [facility] will be here as soon as they can to transport her.

## 2018-07-05 DIAGNOSIS — F0281 Dementia in other diseases classified elsewhere with behavioral disturbance: Secondary | ICD-10-CM | POA: Diagnosis not present

## 2018-07-05 DIAGNOSIS — R296 Repeated falls: Secondary | ICD-10-CM | POA: Diagnosis not present

## 2018-07-05 DIAGNOSIS — Z7689 Persons encountering health services in other specified circumstances: Secondary | ICD-10-CM | POA: Diagnosis not present

## 2018-07-05 DIAGNOSIS — E034 Atrophy of thyroid (acquired): Secondary | ICD-10-CM | POA: Diagnosis not present

## 2018-07-13 DIAGNOSIS — Z792 Long term (current) use of antibiotics: Secondary | ICD-10-CM | POA: Diagnosis not present

## 2018-07-13 DIAGNOSIS — I10 Essential (primary) hypertension: Secondary | ICD-10-CM | POA: Diagnosis not present

## 2018-07-13 DIAGNOSIS — F0280 Dementia in other diseases classified elsewhere without behavioral disturbance: Secondary | ICD-10-CM | POA: Diagnosis not present

## 2018-07-13 DIAGNOSIS — E0590 Thyrotoxicosis, unspecified without thyrotoxic crisis or storm: Secondary | ICD-10-CM | POA: Diagnosis not present

## 2018-07-13 DIAGNOSIS — M791 Myalgia, unspecified site: Secondary | ICD-10-CM | POA: Diagnosis not present

## 2018-07-13 DIAGNOSIS — G309 Alzheimer's disease, unspecified: Secondary | ICD-10-CM | POA: Diagnosis not present

## 2018-07-13 DIAGNOSIS — S42252D Displaced fracture of greater tuberosity of left humerus, subsequent encounter for fracture with routine healing: Secondary | ICD-10-CM | POA: Diagnosis not present

## 2018-07-13 DIAGNOSIS — L03116 Cellulitis of left lower limb: Secondary | ICD-10-CM | POA: Diagnosis not present

## 2018-07-13 DIAGNOSIS — E785 Hyperlipidemia, unspecified: Secondary | ICD-10-CM | POA: Diagnosis not present

## 2018-07-13 DIAGNOSIS — M545 Low back pain: Secondary | ICD-10-CM | POA: Diagnosis not present

## 2018-07-13 DIAGNOSIS — Z9181 History of falling: Secondary | ICD-10-CM | POA: Diagnosis not present

## 2018-07-13 DIAGNOSIS — F329 Major depressive disorder, single episode, unspecified: Secondary | ICD-10-CM | POA: Diagnosis not present

## 2018-07-13 DIAGNOSIS — I739 Peripheral vascular disease, unspecified: Secondary | ICD-10-CM | POA: Diagnosis not present

## 2018-07-13 DIAGNOSIS — M1990 Unspecified osteoarthritis, unspecified site: Secondary | ICD-10-CM | POA: Diagnosis not present

## 2018-07-13 DIAGNOSIS — E039 Hypothyroidism, unspecified: Secondary | ICD-10-CM | POA: Diagnosis not present

## 2018-07-13 DIAGNOSIS — F419 Anxiety disorder, unspecified: Secondary | ICD-10-CM | POA: Diagnosis not present

## 2018-07-14 DIAGNOSIS — Z79899 Other long term (current) drug therapy: Secondary | ICD-10-CM | POA: Diagnosis not present

## 2018-07-14 DIAGNOSIS — E039 Hypothyroidism, unspecified: Secondary | ICD-10-CM | POA: Diagnosis not present

## 2018-07-14 DIAGNOSIS — M25512 Pain in left shoulder: Secondary | ICD-10-CM | POA: Diagnosis not present

## 2018-07-16 DIAGNOSIS — L03116 Cellulitis of left lower limb: Secondary | ICD-10-CM | POA: Diagnosis not present

## 2018-07-16 DIAGNOSIS — S42252D Displaced fracture of greater tuberosity of left humerus, subsequent encounter for fracture with routine healing: Secondary | ICD-10-CM | POA: Diagnosis not present

## 2018-07-16 DIAGNOSIS — G309 Alzheimer's disease, unspecified: Secondary | ICD-10-CM | POA: Diagnosis not present

## 2018-07-16 DIAGNOSIS — M545 Low back pain: Secondary | ICD-10-CM | POA: Diagnosis not present

## 2018-07-16 DIAGNOSIS — E0590 Thyrotoxicosis, unspecified without thyrotoxic crisis or storm: Secondary | ICD-10-CM | POA: Diagnosis not present

## 2018-07-16 DIAGNOSIS — I10 Essential (primary) hypertension: Secondary | ICD-10-CM | POA: Diagnosis not present

## 2018-07-23 DIAGNOSIS — S42252D Displaced fracture of greater tuberosity of left humerus, subsequent encounter for fracture with routine healing: Secondary | ICD-10-CM | POA: Diagnosis not present

## 2018-07-23 DIAGNOSIS — M545 Low back pain: Secondary | ICD-10-CM | POA: Diagnosis not present

## 2018-07-23 DIAGNOSIS — L03116 Cellulitis of left lower limb: Secondary | ICD-10-CM | POA: Diagnosis not present

## 2018-07-23 DIAGNOSIS — E0590 Thyrotoxicosis, unspecified without thyrotoxic crisis or storm: Secondary | ICD-10-CM | POA: Diagnosis not present

## 2018-07-23 DIAGNOSIS — I10 Essential (primary) hypertension: Secondary | ICD-10-CM | POA: Diagnosis not present

## 2018-07-23 DIAGNOSIS — G309 Alzheimer's disease, unspecified: Secondary | ICD-10-CM | POA: Diagnosis not present

## 2018-07-27 DIAGNOSIS — I10 Essential (primary) hypertension: Secondary | ICD-10-CM | POA: Diagnosis not present

## 2018-07-27 DIAGNOSIS — M545 Low back pain: Secondary | ICD-10-CM | POA: Diagnosis not present

## 2018-07-27 DIAGNOSIS — S42252D Displaced fracture of greater tuberosity of left humerus, subsequent encounter for fracture with routine healing: Secondary | ICD-10-CM | POA: Diagnosis not present

## 2018-07-27 DIAGNOSIS — L03116 Cellulitis of left lower limb: Secondary | ICD-10-CM | POA: Diagnosis not present

## 2018-07-27 DIAGNOSIS — E0590 Thyrotoxicosis, unspecified without thyrotoxic crisis or storm: Secondary | ICD-10-CM | POA: Diagnosis not present

## 2018-07-27 DIAGNOSIS — G309 Alzheimer's disease, unspecified: Secondary | ICD-10-CM | POA: Diagnosis not present

## 2018-07-28 DIAGNOSIS — I10 Essential (primary) hypertension: Secondary | ICD-10-CM | POA: Diagnosis not present

## 2018-07-28 DIAGNOSIS — L03116 Cellulitis of left lower limb: Secondary | ICD-10-CM | POA: Diagnosis not present

## 2018-07-28 DIAGNOSIS — E0590 Thyrotoxicosis, unspecified without thyrotoxic crisis or storm: Secondary | ICD-10-CM | POA: Diagnosis not present

## 2018-07-28 DIAGNOSIS — S42252D Displaced fracture of greater tuberosity of left humerus, subsequent encounter for fracture with routine healing: Secondary | ICD-10-CM | POA: Diagnosis not present

## 2018-07-28 DIAGNOSIS — M545 Low back pain: Secondary | ICD-10-CM | POA: Diagnosis not present

## 2018-07-28 DIAGNOSIS — G309 Alzheimer's disease, unspecified: Secondary | ICD-10-CM | POA: Diagnosis not present

## 2018-08-05 DIAGNOSIS — E0590 Thyrotoxicosis, unspecified without thyrotoxic crisis or storm: Secondary | ICD-10-CM | POA: Diagnosis not present

## 2018-08-05 DIAGNOSIS — G309 Alzheimer's disease, unspecified: Secondary | ICD-10-CM | POA: Diagnosis not present

## 2018-08-05 DIAGNOSIS — M545 Low back pain: Secondary | ICD-10-CM | POA: Diagnosis not present

## 2018-08-05 DIAGNOSIS — I10 Essential (primary) hypertension: Secondary | ICD-10-CM | POA: Diagnosis not present

## 2018-08-05 DIAGNOSIS — L03116 Cellulitis of left lower limb: Secondary | ICD-10-CM | POA: Diagnosis not present

## 2018-08-05 DIAGNOSIS — S42252D Displaced fracture of greater tuberosity of left humerus, subsequent encounter for fracture with routine healing: Secondary | ICD-10-CM | POA: Diagnosis not present

## 2018-08-06 DIAGNOSIS — I10 Essential (primary) hypertension: Secondary | ICD-10-CM | POA: Diagnosis not present

## 2018-08-06 DIAGNOSIS — M545 Low back pain: Secondary | ICD-10-CM | POA: Diagnosis not present

## 2018-08-06 DIAGNOSIS — G309 Alzheimer's disease, unspecified: Secondary | ICD-10-CM | POA: Diagnosis not present

## 2018-08-06 DIAGNOSIS — E0590 Thyrotoxicosis, unspecified without thyrotoxic crisis or storm: Secondary | ICD-10-CM | POA: Diagnosis not present

## 2018-08-06 DIAGNOSIS — S42252D Displaced fracture of greater tuberosity of left humerus, subsequent encounter for fracture with routine healing: Secondary | ICD-10-CM | POA: Diagnosis not present

## 2018-08-06 DIAGNOSIS — L03116 Cellulitis of left lower limb: Secondary | ICD-10-CM | POA: Diagnosis not present

## 2018-08-09 DIAGNOSIS — G309 Alzheimer's disease, unspecified: Secondary | ICD-10-CM | POA: Diagnosis not present

## 2018-08-09 DIAGNOSIS — E0590 Thyrotoxicosis, unspecified without thyrotoxic crisis or storm: Secondary | ICD-10-CM | POA: Diagnosis not present

## 2018-08-09 DIAGNOSIS — S42252D Displaced fracture of greater tuberosity of left humerus, subsequent encounter for fracture with routine healing: Secondary | ICD-10-CM | POA: Diagnosis not present

## 2018-08-09 DIAGNOSIS — M545 Low back pain: Secondary | ICD-10-CM | POA: Diagnosis not present

## 2018-08-09 DIAGNOSIS — L03116 Cellulitis of left lower limb: Secondary | ICD-10-CM | POA: Diagnosis not present

## 2018-08-09 DIAGNOSIS — I10 Essential (primary) hypertension: Secondary | ICD-10-CM | POA: Diagnosis not present

## 2018-08-10 DIAGNOSIS — S42252D Displaced fracture of greater tuberosity of left humerus, subsequent encounter for fracture with routine healing: Secondary | ICD-10-CM | POA: Diagnosis not present

## 2018-08-10 DIAGNOSIS — G309 Alzheimer's disease, unspecified: Secondary | ICD-10-CM | POA: Diagnosis not present

## 2018-08-10 DIAGNOSIS — I10 Essential (primary) hypertension: Secondary | ICD-10-CM | POA: Diagnosis not present

## 2018-08-10 DIAGNOSIS — L03116 Cellulitis of left lower limb: Secondary | ICD-10-CM | POA: Diagnosis not present

## 2018-08-10 DIAGNOSIS — M545 Low back pain: Secondary | ICD-10-CM | POA: Diagnosis not present

## 2018-08-10 DIAGNOSIS — E0590 Thyrotoxicosis, unspecified without thyrotoxic crisis or storm: Secondary | ICD-10-CM | POA: Diagnosis not present

## 2018-08-23 DIAGNOSIS — E034 Atrophy of thyroid (acquired): Secondary | ICD-10-CM | POA: Diagnosis not present

## 2018-08-23 DIAGNOSIS — R4182 Altered mental status, unspecified: Secondary | ICD-10-CM | POA: Diagnosis not present

## 2018-08-23 DIAGNOSIS — F0281 Dementia in other diseases classified elsewhere with behavioral disturbance: Secondary | ICD-10-CM | POA: Diagnosis not present

## 2018-08-24 DIAGNOSIS — S42252D Displaced fracture of greater tuberosity of left humerus, subsequent encounter for fracture with routine healing: Secondary | ICD-10-CM | POA: Diagnosis not present

## 2018-08-24 DIAGNOSIS — L03116 Cellulitis of left lower limb: Secondary | ICD-10-CM | POA: Diagnosis not present

## 2018-08-24 DIAGNOSIS — G309 Alzheimer's disease, unspecified: Secondary | ICD-10-CM | POA: Diagnosis not present

## 2018-08-24 DIAGNOSIS — E0590 Thyrotoxicosis, unspecified without thyrotoxic crisis or storm: Secondary | ICD-10-CM | POA: Diagnosis not present

## 2018-08-24 DIAGNOSIS — M545 Low back pain: Secondary | ICD-10-CM | POA: Diagnosis not present

## 2018-08-24 DIAGNOSIS — I10 Essential (primary) hypertension: Secondary | ICD-10-CM | POA: Diagnosis not present

## 2018-08-25 DIAGNOSIS — E039 Hypothyroidism, unspecified: Secondary | ICD-10-CM | POA: Diagnosis not present

## 2018-08-25 DIAGNOSIS — R319 Hematuria, unspecified: Secondary | ICD-10-CM | POA: Diagnosis not present

## 2018-08-25 DIAGNOSIS — Z79899 Other long term (current) drug therapy: Secondary | ICD-10-CM | POA: Diagnosis not present

## 2018-08-30 ENCOUNTER — Ambulatory Visit: Payer: Medicare Other | Admitting: Internal Medicine

## 2018-08-31 DIAGNOSIS — M545 Low back pain: Secondary | ICD-10-CM | POA: Diagnosis not present

## 2018-08-31 DIAGNOSIS — S42252D Displaced fracture of greater tuberosity of left humerus, subsequent encounter for fracture with routine healing: Secondary | ICD-10-CM | POA: Diagnosis not present

## 2018-08-31 DIAGNOSIS — L03116 Cellulitis of left lower limb: Secondary | ICD-10-CM | POA: Diagnosis not present

## 2018-08-31 DIAGNOSIS — I10 Essential (primary) hypertension: Secondary | ICD-10-CM | POA: Diagnosis not present

## 2018-08-31 DIAGNOSIS — E0590 Thyrotoxicosis, unspecified without thyrotoxic crisis or storm: Secondary | ICD-10-CM | POA: Diagnosis not present

## 2018-08-31 DIAGNOSIS — G309 Alzheimer's disease, unspecified: Secondary | ICD-10-CM | POA: Diagnosis not present

## 2018-09-01 DIAGNOSIS — Z23 Encounter for immunization: Secondary | ICD-10-CM | POA: Diagnosis not present

## 2018-09-07 DIAGNOSIS — M545 Low back pain: Secondary | ICD-10-CM | POA: Diagnosis not present

## 2018-09-07 DIAGNOSIS — F0631 Mood disorder due to known physiological condition with depressive features: Secondary | ICD-10-CM | POA: Diagnosis not present

## 2018-09-07 DIAGNOSIS — E0590 Thyrotoxicosis, unspecified without thyrotoxic crisis or storm: Secondary | ICD-10-CM | POA: Diagnosis not present

## 2018-09-07 DIAGNOSIS — S42252D Displaced fracture of greater tuberosity of left humerus, subsequent encounter for fracture with routine healing: Secondary | ICD-10-CM | POA: Diagnosis not present

## 2018-09-07 DIAGNOSIS — B351 Tinea unguium: Secondary | ICD-10-CM | POA: Diagnosis not present

## 2018-09-07 DIAGNOSIS — M79674 Pain in right toe(s): Secondary | ICD-10-CM | POA: Diagnosis not present

## 2018-09-07 DIAGNOSIS — L03116 Cellulitis of left lower limb: Secondary | ICD-10-CM | POA: Diagnosis not present

## 2018-09-07 DIAGNOSIS — G309 Alzheimer's disease, unspecified: Secondary | ICD-10-CM | POA: Diagnosis not present

## 2018-09-07 DIAGNOSIS — F0281 Dementia in other diseases classified elsewhere with behavioral disturbance: Secondary | ICD-10-CM | POA: Diagnosis not present

## 2018-09-07 DIAGNOSIS — M79675 Pain in left toe(s): Secondary | ICD-10-CM | POA: Diagnosis not present

## 2018-09-07 DIAGNOSIS — I10 Essential (primary) hypertension: Secondary | ICD-10-CM | POA: Diagnosis not present

## 2018-09-30 ENCOUNTER — Emergency Department (HOSPITAL_COMMUNITY): Payer: Medicare Other

## 2018-09-30 ENCOUNTER — Emergency Department (HOSPITAL_COMMUNITY)
Admission: EM | Admit: 2018-09-30 | Discharge: 2018-09-30 | Disposition: A | Discharge status: Home / Self Care | Payer: Medicare Other | Attending: Emergency Medicine | Admitting: Emergency Medicine

## 2018-09-30 ENCOUNTER — Encounter (HOSPITAL_COMMUNITY): Payer: Self-pay

## 2018-09-30 DIAGNOSIS — M2550 Pain in unspecified joint: Secondary | ICD-10-CM | POA: Diagnosis not present

## 2018-09-30 DIAGNOSIS — S199XXA Unspecified injury of neck, initial encounter: Secondary | ICD-10-CM | POA: Diagnosis not present

## 2018-09-30 DIAGNOSIS — F0280 Dementia in other diseases classified elsewhere without behavioral disturbance: Secondary | ICD-10-CM | POA: Diagnosis not present

## 2018-09-30 DIAGNOSIS — E039 Hypothyroidism, unspecified: Secondary | ICD-10-CM | POA: Insufficient documentation

## 2018-09-30 DIAGNOSIS — G308 Other Alzheimer's disease: Secondary | ICD-10-CM | POA: Insufficient documentation

## 2018-09-30 DIAGNOSIS — I10 Essential (primary) hypertension: Secondary | ICD-10-CM | POA: Diagnosis not present

## 2018-09-30 DIAGNOSIS — S0003XA Contusion of scalp, initial encounter: Secondary | ICD-10-CM | POA: Diagnosis not present

## 2018-09-30 DIAGNOSIS — Z79899 Other long term (current) drug therapy: Secondary | ICD-10-CM | POA: Insufficient documentation

## 2018-09-30 DIAGNOSIS — R410 Disorientation, unspecified: Secondary | ICD-10-CM | POA: Diagnosis not present

## 2018-09-30 DIAGNOSIS — Y939 Activity, unspecified: Secondary | ICD-10-CM | POA: Diagnosis not present

## 2018-09-30 DIAGNOSIS — Z7401 Bed confinement status: Secondary | ICD-10-CM | POA: Diagnosis not present

## 2018-09-30 DIAGNOSIS — R404 Transient alteration of awareness: Secondary | ICD-10-CM | POA: Diagnosis not present

## 2018-09-30 DIAGNOSIS — Z96659 Presence of unspecified artificial knee joint: Secondary | ICD-10-CM | POA: Insufficient documentation

## 2018-09-30 DIAGNOSIS — S0083XA Contusion of other part of head, initial encounter: Secondary | ICD-10-CM | POA: Insufficient documentation

## 2018-09-30 DIAGNOSIS — W19XXXA Unspecified fall, initial encounter: Secondary | ICD-10-CM | POA: Insufficient documentation

## 2018-09-30 DIAGNOSIS — Y92129 Unspecified place in nursing home as the place of occurrence of the external cause: Secondary | ICD-10-CM | POA: Diagnosis not present

## 2018-09-30 DIAGNOSIS — Y999 Unspecified external cause status: Secondary | ICD-10-CM | POA: Diagnosis not present

## 2018-09-30 DIAGNOSIS — S0990XA Unspecified injury of head, initial encounter: Secondary | ICD-10-CM | POA: Diagnosis not present

## 2018-09-30 NOTE — ED Notes (Signed)
This RN attempted for the second time to call [facility] about this pt.

## 2018-09-30 NOTE — Discharge Instructions (Signed)
Evaluated today for fall.  CT head negative for acute bleed. Follow- up with the PCP for reevaluation  Return to the ED with any new or worsening symptoms.

## 2018-09-30 NOTE — ED Notes (Signed)
This RN attempted to call [facility] about this pt.

## 2018-09-30 NOTE — ED Notes (Signed)
Patient transported to CT 

## 2018-09-30 NOTE — ED Notes (Signed)
Bed: WA20 Expected date:  Expected time:  Means of arrival:  Comments: EMS-fall 

## 2018-09-30 NOTE — ED Notes (Signed)
Pt unable to sign for herself  but verbalized Rn could sign for her. Rn witness

## 2018-09-30 NOTE — ED Notes (Signed)
[facility] contacted to take pt back to [facility]

## 2018-09-30 NOTE — ED Triage Notes (Signed)
Per EMS: Unwitnessed fall from nursing  Home.  Pt has hematoma. Pt hx of dementia, pt alert to person, staff reports she is at baseline.  Pt is not on blood thinners.  Pt has no other obvious injuries.  Pt's eyes are red, which is normal for her per nursing home staff.

## 2018-09-30 NOTE — ED Notes (Signed)
[facility] at bedside to take pt back to [facility]

## 2018-09-30 NOTE — ED Provider Notes (Signed)
[facility] DEPT Provider Note   CSN: [id number] Arrival date & time: 09/30/18  1614     History   Chief Complaint Chief Complaint  Patient presents with  . Fall    HPI Lisa [name] is a 84 y.o. female past medical history significant for dementia, hypertension, hyperlipidemia and hypothyroidism who presents for evaluation of unwitnessed fall.  Patient currently lives at [facility].  Per facility staff patient is at [facility].  Patient is not on blood thinners. She normally ambulates with a walker per EMS.    Level 5 caveat due to dementia.  HPI  Past Medical History:  Diagnosis Date  . Dementia ([place])   . Glaucoma   . HYPERLIPIDEMIA 11/23/2007  . HYPERTENSION 11/23/2007  . HYPOTHYROIDISM 11/23/2007  . LOW BACK PAIN 11/23/2007  . Macular degeneration   . OSTEOARTHRITIS 11/23/2007  . PERIPHERAL VASCULAR DISEASE 07/11/2008    Patient Active Problem List   Diagnosis Date Noted  . Left wrist injury, initial encounter 06/03/2017  . Distal radius fracture, left 06/03/2017  . Depression 09/02/2016  . Well adult exam 08/31/2014  . Fatigue 07/22/2011  . Syncopal episodes 06/25/2011  . Hyponatremia 06/25/2011  . Alzheimer disease ([place]) 06/25/2011  . DIARRHEA 08/01/2008  . PERIPHERAL VASCULAR DISEASE 07/11/2008  . NEOPLASM OF UNCERTAIN BEHAVIOR OF SKIN 05/23/2008  . OTHER NONTHROMBOCYTOPENIC PURPURAS 05/23/2008  . MYALGIA 05/23/2008  . Hypothyroidism 11/23/2007  . HYPERLIPIDEMIA 11/23/2007  . Essential hypertension 11/23/2007  . OSTEOARTHRITIS 11/23/2007  . LOW BACK PAIN 11/23/2007    Past Surgical History:  Procedure Laterality Date  . LUMBAR FUSION    . TOTAL KNEE ARTHROPLASTY       OB History   None      Home Medications    Prior to Admission medications   Medication Sig Start Date End Date Taking? Authorizing Provider  acetaminophen (TYLENOL) 500 MG tablet Take 500 mg by mouth every 4 (four) hours as needed (For  headache, minor discomfort and fever (99.5-101F)). Not to exceed 2000 mg per 24 hours    Yes [provider]  alum & mag hydroxide-simeth ([place]) 200-200-20 MG/5ML suspension Take 30 mLs by mouth every 6 (six) hours as needed for indigestion or heartburn (Do not exceed 4 doses in 24 hours.).    Yes [provider]  amLODipine (NORVASC) 5 MG tablet Take 1 tablet (5 mg total) by mouth daily. Patient taking differently: Take 7.5 mg by mouth daily.  08/24/17  Yes Plotnikov, Evie Lacks, MD  Cholecalciferol (VITAMIN D3) 1000 UNITS CAPS Take 1 capsule by mouth daily. Patient taking differently: Take 1,000 Units by mouth daily.  09/16/11  Yes Plotnikov, Evie Lacks, MD  erythromycin ophthalmic ointment Place a 1/2 inch ribbon of ointment into the lower eyelid. 07/03/18  Yes Mesner, Corene Cornea, MD  escitalopram (LEXAPRO) 10 MG tablet Take 10 mg by mouth daily.   Yes [provider]  guaifenesin (ROBITUSSIN) 100 MG/5ML syrup Take 200 mg by mouth every 6 (six) hours as needed for cough. Not to exceed 4 doses in 24 hours   Yes [provider]  loperamide (IMODIUM) 2 MG capsule Take 2 mg by mouth as needed for diarrhea or loose stools (Do not exceed 8 doses in 24 hours.).    Yes [provider]  losartan (COZAAR) 100 MG tablet TAKE 1 TABLET BY MOUTH EVERY DAY 09/15/17  Yes Plotnikov, Evie Lacks, MD  Loteprednol Etabonate (LOTEMAX) 0.5 % OINT Place 1 strip into both eyes 2 (  two) times daily.   Yes [provider]  magnesium hydroxide (MILK OF MAGNESIA) 400 MG/5ML suspension Take 30 mLs by mouth at bedtime as needed for mild constipation.   Yes [provider]  memantine (NAMENDA XR) 14 MG CP24 24 hr capsule Take 14 mg by mouth daily.    Yes [provider]  Menthol, Topical Analgesic, (BIOFREEZE) 4 % GEL Apply 1 application topically 3 (three) times daily as needed (left hip pain).   Yes [provider]  neomycin-bacitracin-polymyxin (NEOSPORIN)  ointment Apply 1 application topically as needed for wound care.   Yes [provider]  nystatin-triamcinolone (MYCOLOG II) cream Apply 1 application topically at bedtime. To lower extremities after removing TED hose   Yes [provider]  OVER THE COUNTER MEDICATION Take 1 Can by mouth 4 (four) times daily. House Shake   Yes [provider]  Travoprost, BAK Free, (TRAVATAN Z) 0.004 % SOLN ophthalmic solution Place 2 drops into both eyes every morning.    Yes [provider]  traZODone (DESYREL) 50 MG tablet Take 1-2 tablets (50-100 mg total) by mouth at bedtime. Patient taking differently: Take 50 mg by mouth at bedtime.  10/09/17  Yes Plotnikov, Evie Lacks, MD  acetaminophen (TYLENOL) 325 MG tablet Take 2 tablets (650 mg total) by mouth every 6 (six) hours as needed for moderate pain. Patient not taking: Reported on 09/30/2018 06/07/18   Albesa Seen, PA-C    Family History Family History  Problem Relation Age of Onset  . Hyperlipidemia Mother   . Diabetes Father   . Hypertension Other     Social History Social History   Tobacco Use  . Smoking status: Never Smoker  . Smokeless tobacco: Never Used  Substance Use Topics  . Alcohol use: No  . Drug use: No     Allergies   Aricept [donepezil hydrochloride]   Review of Systems Review of Systems  Unable to perform ROS: Dementia     Physical Exam Updated Vital Signs BP (!) 145/50 (BP Location: Left Arm)   Pulse 70   Temp 97.7 F (36.5 C)   Resp 19   Ht 5\' 6"  (1.676 m)   Wt 56.7 kg   SpO2 99%   BMI 20.18 kg/m   Physical Exam  Constitutional: She appears well-developed and well-nourished. No distress.  HENT:  Head: Atraumatic.  Right Ear: External ear normal.  Left Ear: External ear normal.  Mouth/Throat: Oropharynx is clear and moist.  Mild hematoma to posterior head.  Palpation.  Eyes: Pupils are equal, round, and reactive to light.  Exophthalmos to bilateral eyes.  Erythematous conjunctiva at baseline according to EMS via facility.  Neck: Normal range of motion. Neck supple.  No tenderness to palpation midline cervical spine and back. Full ROM without pain   Cardiovascular: Normal rate, regular rhythm, normal heart sounds and intact distal pulses.  Pulmonary/Chest: Effort normal and breath sounds normal. No stridor. No respiratory distress. She has no wheezes. She has no rales. She exhibits no tenderness.  Abdominal: Soft. Bowel sounds are normal. She exhibits no distension and no mass. There is no tenderness. There is no rebound and no guarding. No hernia.  Musculoskeletal: Normal range of motion.  No gross deformity to the lateral upper and lower extremity. Full range of motion of the T-spine and L-spine with flexion, hyperextension, and lateral flexion. No midline tenderness or stepoffs. No tenderness to palpation of the spinous processes of the T-spine or L-spine. No tenderness to  palpation of the paraspinous muscles of the L-spine.  Neurological: She is alert.  Oriented to person and place.  Not oriented to time. Follows commands Normal 5/5 strength in upper and lower extremities bilaterally including dorsiflexion and plantar flexion, strong and equal grip strength Sensation normal to light and sharp touch Moves extremities without ataxia, coordination intact Normal gait Normal balance No Clonus  Skin: Skin is warm and dry. She is not diaphoretic.  Chronic venous stasis skin changes to bilateral lower extremities.    Psychiatric: She has a normal mood and affect.  Nursing note and vitals reviewed.   ED Treatments / Results  Labs (all labs ordered are listed, but only abnormal results are displayed) Labs Reviewed - No data to display  EKG None  Radiology Ct Head Wo Contrast  Result Date: 09/30/2018 CLINICAL DATA:  Unwitnessed fall. EXAM: CT HEAD WITHOUT CONTRAST TECHNIQUE: Contiguous axial images were obtained from the base of the skull  through the vertex without intravenous contrast. COMPARISON:  09/30/2018 FINDINGS: Brain: No evidence of acute infarction, hemorrhage, hydrocephalus, extra-axial collection or mass lesion/mass effect. Moderate brain parenchymal volume loss and deep white matter microangiopathy. Vascular: No hyperdense vessel or unexpected calcification. Skull: Normal. Negative for fracture or focal lesion. Sinuses/Orbits: No acute finding. Other: Left parietal scalp hematoma. IMPRESSION: No acute intracranial abnormality. Moderate brain parenchymal atrophy and chronic microvascular disease. Electronically Signed   By: Fidela Salisbury M.D.   On: 09/30/2018 19:44   Ct Cervical Spine Wo Contrast  Result Date: 09/30/2018 CLINICAL DATA:  Unwitnessed fall.  Dementia.  Swelling. EXAM: CT CERVICAL SPINE WITHOUT CONTRAST TECHNIQUE: Multidetector CT imaging of the cervical spine was performed without intravenous contrast. Multiplanar CT image reconstructions were also generated. COMPARISON:  07/03/2018 FINDINGS: Alignment: No traumatic malalignment. 3 mm degenerative anterolisthesis at C6-7 and C7-T1. Skull base and vertebrae: No acute or traumatic finding. Chronic fusion at C4-5. Soft tissues and spinal canal: Negative Disc levels: Chronic degenerative arthritis at the C1-2 articulation. Chronic facet osteoarthritis on both sides of the neck, most pronounced on the left at C3-4. Chronic spondylosis at C5-6, C6-7 and C7-T1. No apparent compressive stenosis. Upper chest: Scarring at the apices. Other: None IMPRESSION: No acute or traumatic finding. Chronic degenerative changes throughout the neck, not significantly changed since earlier this year. Electronically Signed   By: Nelson Chimes M.D.   On: 09/30/2018 19:46    Procedures Procedures (including critical care time)  Medications Ordered in ED Medications - No data to display   Initial Impression / Assessment and Plan / ED Course  I have reviewed the triage vital signs  and the nursing notes.  Pertinent labs & imaging results that were available during my care of the patient were reviewed by me and considered in my medical decision making (see chart for details).  84 year old female who is demented at baseline presents via EMS after unwitnessed fall.  Nonfocal neuro exam without neuro deficits.  Denies any complaints, however demented at baseline.  Able to follow commands without difficulty.  Oriented to person and place however not time.  Patient was witnessed ambulating in hallway without difficulty or without ataxic gait.  No Pain with ambulation.  Will obtain CT head to rule out bleed and reevaluate.   Staff and this provider have attempted to contact the facility multiple times.  Facility unable to be reached.  My attending Dr. Ralene Bathe was able to reach patient's emergency contact, which is her husband.  CT without any acute findings.  Patient stable  for discharge at this time.   Patient was seen and evaluated by my attending, Dr. Ayesha Rumpf, who agrees with the above treatment, plan and evaluation of patient  Final Clinical Impressions(s) / ED Diagnoses   Final diagnoses:  Fall, initial encounter    ED Discharge Orders    None       Keiston Manley A, PA-C 10/01/18 0052    Quintella Reichert, MD 10/03/18 [phone]

## 2018-09-30 NOTE — ED Notes (Signed)
Report given to staff member at [facility]

## 2018-11-10 DIAGNOSIS — Z79899 Other long term (current) drug therapy: Secondary | ICD-10-CM | POA: Diagnosis not present

## 2018-11-10 DIAGNOSIS — E039 Hypothyroidism, unspecified: Secondary | ICD-10-CM | POA: Diagnosis not present

## 2018-11-15 DIAGNOSIS — I10 Essential (primary) hypertension: Secondary | ICD-10-CM | POA: Diagnosis not present

## 2018-11-15 DIAGNOSIS — F0281 Dementia in other diseases classified elsewhere with behavioral disturbance: Secondary | ICD-10-CM | POA: Diagnosis not present

## 2018-11-15 DIAGNOSIS — F0631 Mood disorder due to known physiological condition with depressive features: Secondary | ICD-10-CM | POA: Diagnosis not present

## 2018-11-15 DIAGNOSIS — E0590 Thyrotoxicosis, unspecified without thyrotoxic crisis or storm: Secondary | ICD-10-CM | POA: Diagnosis not present

## 2018-11-29 DIAGNOSIS — H401133 Primary open-angle glaucoma, bilateral, severe stage: Secondary | ICD-10-CM | POA: Diagnosis not present

## 2018-12-06 ENCOUNTER — Emergency Department (HOSPITAL_COMMUNITY)
Admission: EM | Admit: 2018-12-06 | Discharge: 2018-12-07 | Disposition: A | Discharge status: Other Acute Inpatient Hospital | Payer: Medicare Other | Attending: Emergency Medicine | Admitting: Emergency Medicine

## 2018-12-06 ENCOUNTER — Encounter (HOSPITAL_COMMUNITY): Payer: Self-pay

## 2018-12-06 DIAGNOSIS — E039 Hypothyroidism, unspecified: Secondary | ICD-10-CM | POA: Insufficient documentation

## 2018-12-06 DIAGNOSIS — H1033 Unspecified acute conjunctivitis, bilateral: Secondary | ICD-10-CM | POA: Diagnosis not present

## 2018-12-06 DIAGNOSIS — Z471 Aftercare following joint replacement surgery: Secondary | ICD-10-CM | POA: Diagnosis not present

## 2018-12-06 DIAGNOSIS — Y999 Unspecified external cause status: Secondary | ICD-10-CM | POA: Diagnosis not present

## 2018-12-06 DIAGNOSIS — S299XXA Unspecified injury of thorax, initial encounter: Secondary | ICD-10-CM | POA: Diagnosis not present

## 2018-12-06 DIAGNOSIS — S0121XA Laceration without foreign body of nose, initial encounter: Secondary | ICD-10-CM | POA: Diagnosis not present

## 2018-12-06 DIAGNOSIS — S0181XA Laceration without foreign body of other part of head, initial encounter: Secondary | ICD-10-CM | POA: Insufficient documentation

## 2018-12-06 DIAGNOSIS — R609 Edema, unspecified: Secondary | ICD-10-CM | POA: Diagnosis not present

## 2018-12-06 DIAGNOSIS — S199XXA Unspecified injury of neck, initial encounter: Secondary | ICD-10-CM | POA: Diagnosis not present

## 2018-12-06 DIAGNOSIS — Z79899 Other long term (current) drug therapy: Secondary | ICD-10-CM | POA: Diagnosis not present

## 2018-12-06 DIAGNOSIS — Y92129 Unspecified place in nursing home as the place of occurrence of the external cause: Secondary | ICD-10-CM | POA: Insufficient documentation

## 2018-12-06 DIAGNOSIS — I10 Essential (primary) hypertension: Secondary | ICD-10-CM | POA: Diagnosis not present

## 2018-12-06 DIAGNOSIS — R58 Hemorrhage, not elsewhere classified: Secondary | ICD-10-CM | POA: Diagnosis not present

## 2018-12-06 DIAGNOSIS — R0902 Hypoxemia: Secondary | ICD-10-CM | POA: Diagnosis not present

## 2018-12-06 DIAGNOSIS — Z23 Encounter for immunization: Secondary | ICD-10-CM | POA: Insufficient documentation

## 2018-12-06 DIAGNOSIS — Z96643 Presence of artificial hip joint, bilateral: Secondary | ICD-10-CM | POA: Diagnosis not present

## 2018-12-06 DIAGNOSIS — W19XXXA Unspecified fall, initial encounter: Secondary | ICD-10-CM | POA: Insufficient documentation

## 2018-12-06 DIAGNOSIS — Y939 Activity, unspecified: Secondary | ICD-10-CM | POA: Insufficient documentation

## 2018-12-06 DIAGNOSIS — S0990XA Unspecified injury of head, initial encounter: Secondary | ICD-10-CM | POA: Diagnosis present

## 2018-12-06 DIAGNOSIS — F0390 Unspecified dementia without behavioral disturbance: Secondary | ICD-10-CM | POA: Insufficient documentation

## 2018-12-06 NOTE — ED Notes (Signed)
Bed: KL50 Expected date:  Expected time:  Means of arrival:  Comments: 84 yr old fall, head laceration

## 2018-12-06 NOTE — ED Triage Notes (Signed)
Pt had an unwitnessed fall and unknown down time, pt has a nose injury and a head laceration on her forehead

## 2018-12-07 ENCOUNTER — Other Ambulatory Visit: Payer: Self-pay

## 2018-12-07 ENCOUNTER — Emergency Department (HOSPITAL_COMMUNITY): Payer: Medicare Other

## 2018-12-07 DIAGNOSIS — S299XXA Unspecified injury of thorax, initial encounter: Secondary | ICD-10-CM | POA: Diagnosis not present

## 2018-12-07 DIAGNOSIS — I10 Essential (primary) hypertension: Secondary | ICD-10-CM | POA: Diagnosis not present

## 2018-12-07 DIAGNOSIS — S199XXA Unspecified injury of neck, initial encounter: Secondary | ICD-10-CM | POA: Diagnosis not present

## 2018-12-07 DIAGNOSIS — S0990XA Unspecified injury of head, initial encounter: Secondary | ICD-10-CM | POA: Diagnosis not present

## 2018-12-07 DIAGNOSIS — S0181XA Laceration without foreign body of other part of head, initial encounter: Secondary | ICD-10-CM | POA: Diagnosis not present

## 2018-12-07 DIAGNOSIS — Z96643 Presence of artificial hip joint, bilateral: Secondary | ICD-10-CM | POA: Diagnosis not present

## 2018-12-07 DIAGNOSIS — Z471 Aftercare following joint replacement surgery: Secondary | ICD-10-CM | POA: Diagnosis not present

## 2018-12-07 DIAGNOSIS — S0121XA Laceration without foreign body of nose, initial encounter: Secondary | ICD-10-CM | POA: Diagnosis not present

## 2018-12-07 DIAGNOSIS — R410 Disorientation, unspecified: Secondary | ICD-10-CM | POA: Diagnosis not present

## 2018-12-07 DIAGNOSIS — Z743 Need for continuous supervision: Secondary | ICD-10-CM | POA: Diagnosis not present

## 2018-12-07 DIAGNOSIS — R279 Unspecified lack of coordination: Secondary | ICD-10-CM | POA: Diagnosis not present

## 2018-12-07 DIAGNOSIS — R609 Edema, unspecified: Secondary | ICD-10-CM | POA: Diagnosis not present

## 2018-12-07 LAB — CBC WITH DIFFERENTIAL/PLATELET
Abs Immature Granulocytes: 0.04 10*3/uL (ref 0.00–0.07)
Basophils Absolute: 0 10*3/uL (ref 0.0–0.1)
Basophils Relative: 0 %
Eosinophils Absolute: 0.2 10*3/uL (ref 0.0–0.5)
Eosinophils Relative: 2 %
HCT: 40.2 % (ref 36.0–46.0)
HEMOGLOBIN: 13.1 g/dL (ref 12.0–15.0)
Immature Granulocytes: 0 %
Lymphocytes Relative: 20 %
Lymphs Abs: 1.9 10*3/uL (ref 0.7–4.0)
MCH: 30.3 pg (ref 26.0–34.0)
MCHC: 32.6 g/dL (ref 30.0–36.0)
MCV: 92.8 fL (ref 80.0–100.0)
MONOS PCT: 9 %
Monocytes Absolute: 0.8 10*3/uL (ref 0.1–1.0)
Neutro Abs: 6.5 10*3/uL (ref 1.7–7.7)
Neutrophils Relative %: 69 %
Platelets: 271 10*3/uL (ref 150–400)
RBC: 4.33 MIL/uL (ref 3.87–5.11)
RDW: 14.9 % (ref 11.5–15.5)
WBC: 9.4 10*3/uL (ref 4.0–10.5)
nRBC: 0 % (ref 0.0–0.2)

## 2018-12-07 LAB — BASIC METABOLIC PANEL
Anion gap: 10 (ref 5–15)
BUN: 29 mg/dL — ABNORMAL HIGH (ref 8–23)
CO2: 25 mmol/L (ref 22–32)
Calcium: 9.2 mg/dL (ref 8.9–10.3)
Chloride: 100 mmol/L (ref 98–111)
Creatinine, Ser: 0.72 mg/dL (ref 0.44–1.00)
GFR calc Af Amer: >60 mL/min (ref >60)
GFR calc non Af Amer: >60 mL/min (ref >60)
GLUCOSE: 98 mg/dL (ref 70–99)
Potassium: 3.7 mmol/L (ref 3.5–5.1)
Sodium: 135 mmol/L (ref 135–145)

## 2018-12-07 LAB — URINALYSIS, ROUTINE W REFLEX MICROSCOPIC
Bilirubin Urine: NEGATIVE
Glucose, UA: NEGATIVE mg/dL
KETONES UR: NEGATIVE mg/dL
Nitrite: NEGATIVE
Protein, ur: 30 mg/dL — AB
Specific Gravity, Urine: 1.011 (ref 1.005–1.030)
pH: 7 (ref 5.0–8.0)

## 2018-12-07 LAB — I-STAT CG4 LACTIC ACID, ED: Lactic Acid, Venous: 0.71 mmol/L (ref 0.5–1.9)

## 2018-12-07 MED ORDER — TETANUS-DIPHTH-ACELL PERTUSSIS 5-2.5-18.5 LF-MCG/0.5 IM SUSP
0.5000 mL | Freq: Once | INTRAMUSCULAR | Status: AC
  Administered 2018-12-07: 0.5 mL via INTRAMUSCULAR
  Filled 2018-12-07: qty 0.5

## 2018-12-07 MED ORDER — LIDOCAINE HCL (PF) 1 % IJ SOLN
5.0000 mL | Freq: Once | INTRAMUSCULAR | Status: AC
  Administered 2018-12-07: 5 mL
  Filled 2018-12-07: qty 30

## 2018-12-07 MED ORDER — SODIUM CHLORIDE 0.9 % IV BOLUS: 500.0000 mL | Freq: Once | INTRAVENOUS | Status: DC

## 2018-12-07 MED ORDER — LIDOCAINE-EPINEPHRINE-TETRACAINE (LET) SOLUTION
3.0000 mL | Freq: Once | NASAL | Status: AC
  Administered 2018-12-07: 3 mL via TOPICAL
  Filled 2018-12-07: qty 3

## 2018-12-07 MED ORDER — ERYTHROMYCIN 5 MG/GM OP OINT: TOPICAL_OINTMENT | Freq: Every day | OPHTHALMIC | 0 refills | Status: AC

## 2018-12-07 NOTE — ED Notes (Signed)
Patient transported to CT 

## 2018-12-07 NOTE — Discharge Instructions (Addendum)
You have been diagnosed today with Scalp laceration after fall and bilateral conjunctivitis.   At this time there does not appear to be the presence of an emergent medical condition, however there is always the potential for conditions to change. Please read and follow the below instructions.  Please return to the Emergency Department immediately for any new or worsening symptoms. Please be sure to follow up with your Primary Care Provider this week regarding your visit today; please call their office to schedule an appointment even if you are feeling better for a follow-up visit. Please have the patient's wound rechecked for suture removal in 5 days, monitor for signs of infection. It is likely that the patient is experiencing a conjunctivitis at this time, please use the antibiotic ointment erythromycin as prescribed in both eyes.  Please have the patient's primary care provider recheck the patient's eyes.  Get help right away if: You develop severe swelling around your wound. You have pus or a bad smell coming from your wound. Your pain suddenly gets worse and is severe. You develop painful lumps near your wound or anywhere on your body. You have a red streak going away from your wound. Get help right away if you have: A fever and your symptoms suddenly get worse. Severe pain when you move your eye. Facial pain, redness, or swelling. Sudden loss of vision. Get help right away if: You have: A very bad (severe) headache that is not helped by medicine. Trouble walking or weakness in your arms and legs. Clear or bloody fluid coming from your nose or ears. Changes in your seeing (vision). Jerky movements that you cannot control (seizure). You throw up (vomit). Your symptoms get worse. You lose balance. Your speech is slurred. You pass out. You are sleepier and have trouble staying awake. The black centers of your eyes (pupils) change in size.  Please read the additional information  packets attached to your discharge summary.  Do not take your medicine if  develop an itchy rash, swelling in your mouth or lips, or difficulty breathing.

## 2018-12-07 NOTE — ED Notes (Signed)
Pt repeatedly sts "I have to go to the bathroom"  Pt is informed that a purwik was placed so that she is able to urinate. Pt continues to make the same statement. RN notified.

## 2018-12-07 NOTE — ED Notes (Signed)
I have just notified [facility] for transport back to [facility]. Pt. Is awake, very busy and in no distress.

## 2018-12-07 NOTE — ED Provider Notes (Signed)
[facility] DEPT Provider Note   CSN: [id number] Arrival date & time: 12/06/18  2355     History   Chief Complaint Chief Complaint  Patient presents with  . Fall  . Head Laceration  . Facial Injury    HPI Lisa [name] is a 84 y.o. female with history of dementia presenting today after unwitnessed fall at nursing facility.  Patient found on the floor by facility staff with laceration to forehead.  Upon initial evaluation patient resting comfortably, no acute distress.  Stating she has mild pain to her forehead denies any other complaints.  No blood thinner on medication review. HPI  Past Medical History:  Diagnosis Date  . Dementia ([facility])   . Glaucoma   . HYPERLIPIDEMIA 11/23/2007  . HYPERTENSION 11/23/2007  . HYPOTHYROIDISM 11/23/2007  . LOW BACK PAIN 11/23/2007  . Macular degeneration   . OSTEOARTHRITIS 11/23/2007  . PERIPHERAL VASCULAR DISEASE 07/11/2008    Patient Active Problem List   Diagnosis Date Noted  . Left wrist injury, initial encounter 06/03/2017  . Distal radius fracture, left 06/03/2017  . Depression 09/02/2016  . Well adult exam 08/31/2014  . Fatigue 07/22/2011  . Syncopal episodes 06/25/2011  . Hyponatremia 06/25/2011  . Alzheimer disease ([place]) 06/25/2011  . DIARRHEA 08/01/2008  . PERIPHERAL VASCULAR DISEASE 07/11/2008  . NEOPLASM OF UNCERTAIN BEHAVIOR OF SKIN 05/23/2008  . OTHER NONTHROMBOCYTOPENIC PURPURAS 05/23/2008  . MYALGIA 05/23/2008  . Hypothyroidism 11/23/2007  . HYPERLIPIDEMIA 11/23/2007  . Essential hypertension 11/23/2007  . OSTEOARTHRITIS 11/23/2007  . LOW BACK PAIN 11/23/2007    Past Surgical History:  Procedure Laterality Date  . LUMBAR FUSION    . TOTAL KNEE ARTHROPLASTY       OB History   No obstetric history on file.      Home Medications    Prior to Admission medications   Medication Sig Start Date End Date Taking? Authorizing Provider  acetaminophen (TYLENOL) 500 MG tablet Take  500 mg by mouth every 6 (six) hours. Not to exceed 2000 mg per 24 hours    Yes [provider]  alum & mag hydroxide-simeth ([place]) 200-200-20 MG/5ML suspension Take 30 mLs by mouth every 6 (six) hours as needed for indigestion or heartburn (Do not exceed 4 doses in 24 hours.).    Yes [provider]  amLODipine (NORVASC) 5 MG tablet Take 1 tablet (5 mg total) by mouth daily. Patient taking differently: Take 7.5 mg by mouth daily.  08/24/17  Yes Plotnikov, Evie Lacks, MD  Cholecalciferol (VITAMIN D3) 1000 UNITS CAPS Take 1 capsule by mouth daily. Patient taking differently: Take 1,000 Units by mouth daily.  09/16/11  Yes Plotnikov, Evie Lacks, MD  escitalopram (LEXAPRO) 10 MG tablet Take 10 mg by mouth daily.   Yes [provider]  guaifenesin (ROBITUSSIN) 100 MG/5ML syrup Take 200 mg by mouth every 6 (six) hours as needed for cough. Not to exceed 4 doses in 24 hours   Yes [provider]  loperamide (IMODIUM) 2 MG capsule Take 2 mg by mouth as needed for diarrhea or loose stools (Do not exceed 8 doses in 24 hours.).    Yes [provider]  losartan (COZAAR) 100 MG tablet TAKE 1 TABLET BY MOUTH EVERY DAY Patient taking differently: Take 100 mg by mouth daily.  09/15/17  Yes Plotnikov, Evie Lacks, MD  Loteprednol Etabonate (LOTEMAX) 0.5 % OINT Place 1 strip into both eyes 2 (two) times daily.   Yes [provider]  magnesium  hydroxide (MILK OF MAGNESIA) 400 MG/5ML suspension Take 30 mLs by mouth at bedtime as needed for mild constipation.   Yes [provider]  memantine (NAMENDA XR) 14 MG CP24 24 hr capsule Take 14 mg by mouth daily.    Yes [provider]  Menthol, Topical Analgesic, (BIOFREEZE) 4 % GEL Apply 1 application topically 3 (three) times daily as needed (left hip pain).   Yes [provider]  methimazole (TAPAZOLE) 5 MG tablet Take 5 mg by mouth 3 (three) times daily.  09/03/18  Yes [provider]    metoprolol succinate (TOPROL-XL) 25 MG 24 hr tablet Take 25 mg by mouth daily. 09/03/18  Yes [provider]  neomycin-bacitracin-polymyxin (NEOSPORIN) ointment Apply 1 application topically as needed for wound care.   Yes [provider]  OVER THE COUNTER MEDICATION Take 1 Can by mouth 4 (four) times daily. House Shake   Yes [provider]  Travoprost, BAK Free, (TRAVATAN Z) 0.004 % SOLN ophthalmic solution Place 2 drops into both eyes every morning.    Yes [provider]  traZODone (DESYREL) 50 MG tablet Take 1-2 tablets (50-100 mg total) by mouth at bedtime. Patient taking differently: Take 50 mg by mouth at bedtime.  10/09/17  Yes Plotnikov, Evie Lacks, MD  acetaminophen (TYLENOL) 325 MG tablet Take 2 tablets (650 mg total) by mouth every 6 (six) hours as needed for moderate pain. Patient not taking: Reported on 12/07/2018 06/07/18   Langston Masker B, PA-C  erythromycin ophthalmic ointment Place into both eyes 5 (five) times daily for 7 days. Place a 1/2 inch ribbon of ointment into the lower eyelid. 12/07/18 12/14/18  Deliah Boston, PA-C    Family History Family History  Problem Relation Age of Onset  . Hyperlipidemia Mother   . Diabetes Father   . Hypertension Other     Social History Social History   Tobacco Use  . Smoking status: Never Smoker  . Smokeless tobacco: Never Used  Substance Use Topics  . Alcohol use: No  . Drug use: No     Allergies   Aricept [donepezil hydrochloride]   Review of Systems Review of Systems  Unable to perform ROS: Dementia   Physical Exam Updated Vital Signs BP (!) 175/96 (BP Location: Left Arm)   Pulse 68   Temp (!) 97.3 F (36.3 C) (Oral)   Resp 12   SpO2 98%   Physical Exam Constitutional:      General: She is not in acute distress.    Appearance: She is not ill-appearing.  HENT:     Head: Normocephalic. Contusion and laceration present. No raccoon eyes or Battle's sign.     Jaw:  There is normal jaw occlusion. No trismus.      Comments: Patient with Y-shaped laceration to right forehead with swelling/hematoma.    Right Ear: Tympanic membrane, ear canal and external ear normal. No hemotympanum.     Left Ear: Tympanic membrane, ear canal and external ear normal. No hemotympanum.     Nose: Nose normal.     Mouth/Throat:     Lips: Pink.     Mouth: Mucous membranes are moist.     Pharynx: Oropharynx is clear. Uvula midline.  Eyes:     Comments: Bilateral proptosis Bilateral conjunctival erythema with purulent discharge  Neck:     Musculoskeletal: Full passive range of motion without pain, normal range of motion and neck supple. No spinous process tenderness or muscular tenderness.  Trachea: Trachea and phonation normal.  Cardiovascular:     Rate and Rhythm: Normal rate and regular rhythm.     Pulses:          Dorsalis pedis pulses are 2+ on the right side and 2+ on the left side.       Posterior tibial pulses are 2+ on the right side and 2+ on the left side.     Heart sounds: Normal heart sounds.  Pulmonary:     Effort: Pulmonary effort is normal.     Breath sounds: Normal breath sounds and air entry. No decreased breath sounds.  Chest:     Chest wall: No deformity, swelling or crepitus.  Abdominal:     General: Abdomen is flat. Bowel sounds are normal.     Tenderness: There is no abdominal tenderness. There is no guarding or rebound.  Musculoskeletal:     Right shoulder: Normal.     Left shoulder: Normal.     Right elbow: Normal.    Left elbow: Normal.     Right wrist: Normal.     Left wrist: Normal.     Right hip: Normal.     Left hip: Normal.     Right knee: Normal.     Left knee: Normal.     Right ankle: Normal.     Left ankle: Normal.     Comments: No midline C/T/L spinal tenderness to palpation, no paraspinal muscle tenderness, no deformity, crepitus, or step-off noted. No sign of injury to the neck or back.  Hips stable to compression  bilaterally.  Knee-to-chest strong and equal bilaterally without obvious pain or crepitus.  Patient strongly resisted repair of laceration today, full range of motion and 5/5 strength to all extremities.  All major joints palpated and brought through range of motion without crepitus, deformity or obvious signs of distress.  Feet:     Right foot:     Protective Sensation: 3 sites tested. 3 sites sensed.     Left foot:     Protective Sensation: 3 sites tested. 3 sites sensed.  Neurological:     Mental Status: She is alert.    ED Treatments / Results  Labs (all labs ordered are listed, but only abnormal results are displayed) Labs Reviewed  BASIC METABOLIC PANEL - Abnormal; Notable for the following components:      Result Value   BUN 29 (*)    All other components within normal limits  URINALYSIS, ROUTINE W REFLEX MICROSCOPIC - Abnormal; Notable for the following components:   APPearance HAZY (*)    Hgb urine dipstick SMALL (*)    Protein, ur 30 (*)    Leukocytes, UA TRACE (*)    Bacteria, UA RARE (*)    All other components within normal limits  URINE CULTURE  CBC WITH DIFFERENTIAL/PLATELET  I-STAT CG4 LACTIC ACID, ED  I-STAT CG4 LACTIC ACID, ED    EKG None  Radiology Dg Chest 1 View  Result Date: 12/07/2018 CLINICAL DATA:  Recent fall EXAM: CHEST  1 VIEW COMPARISON:  07/03/2018 FINDINGS: Cardiac shadow is within normal limits. Aortic calcifications are again seen. Lungs are well aerated without focal infiltrate or effusion. No acute bony abnormality is noted. IMPRESSION: No acute abnormality seen. Electronically Signed   By: Inez Catalina M.D.   On: 12/07/2018 02:55   Ct Head Wo Contrast  Result Date: 12/07/2018 CLINICAL DATA:  Status post unwitnessed fall. Nasal injury and laceration at the forehead. Concern for head or cervical  spine injury. Initial encounter. EXAM: CT HEAD WITHOUT CONTRAST CT MAXILLOFACIAL WITHOUT CONTRAST CT CERVICAL SPINE WITHOUT CONTRAST TECHNIQUE:  Multidetector CT imaging of the head, cervical spine, and maxillofacial structures were performed using the standard protocol without intravenous contrast. Multiplanar CT image reconstructions of the cervical spine and maxillofacial structures were also generated. COMPARISON:  CT of the head and cervical spine performed 09/30/2018 FINDINGS: CT HEAD FINDINGS Brain: No evidence of acute infarction, hemorrhage, hydrocephalus, extra-axial collection or mass lesion / mass effect. Prominence of the ventricles and sulci reflects mild to moderate cortical volume loss. Mild cerebellar atrophy is noted. Scattered periventricular and subcortical white matter change likely reflects small vessel ischemic microangiopathy. The brainstem and fourth ventricle are within normal limits. The basal ganglia are unremarkable in appearance. The cerebral hemispheres demonstrate grossly normal gray-white differentiation. No mass effect or midline shift is seen. Vascular: No hyperdense vessel or unexpected calcification. Skull: There is no evidence of fracture; visualized osseous structures are unremarkable in appearance. Other: Soft tissue swelling is noted overlying the right frontal calvarium. CT MAXILLOFACIAL FINDINGS Osseous: There is no evidence of fracture or dislocation. The maxilla and mandible appear intact. The nasal bone is unremarkable in appearance. The visualized dentition demonstrates no acute abnormality. Evaluation is mildly suboptimal due to motion artifact. Orbits: The orbits are intact bilaterally. Bilateral proptosis is noted. Sinuses: Mucosal thickening is noted at the right maxillary sinus. The remaining visualized paranasal sinuses and mastoid air cells are well-aerated. Soft tissues: Mild soft tissue swelling is noted about the nose. The parapharyngeal fat planes are preserved. The nasopharynx, oropharynx and hypopharynx are unremarkable in appearance. The visualized portions of the valleculae and piriform sinuses  are grossly unremarkable. The parotid and submandibular glands are within normal limits. No cervical lymphadenopathy is seen. CT CERVICAL SPINE FINDINGS Alignment: Normal. Skull base and vertebrae: No acute fracture. No primary bone lesion or focal pathologic process. Soft tissues and spinal canal: No prevertebral fluid or swelling. No visible canal hematoma. Disc levels: Multilevel disc space narrowing is noted along the mid to lower cervical spine, with mild grade 1 anterolisthesis of C7 on T1, reflecting underlying facet disease. Degenerative change is noted about the dens. Upper chest: Mild scarring is noted at the lung apices. The thyroid gland is grossly unremarkable. Mild calcification is seen at the carotid bifurcations bilaterally. Other: No additional soft tissue abnormalities are seen. IMPRESSION: 1. No evidence of traumatic intracranial injury or fracture. 2. No evidence of fracture or dislocation with regard to the maxillofacial structures. 3. No evidence of fracture or subluxation along the cervical spine. 4. Soft tissue swelling overlying the right frontal calvarium, and mild soft tissue swelling about the nose. 5. Mild to moderate cortical volume loss and scattered small vessel ischemic microangiopathy. 6. Bilateral proptosis noted. 7. Mucosal thickening at the right maxillary sinus. 8. Mild degenerative change along the mid to lower cervical spine. 9. Mild calcification at the carotid bifurcations bilaterally. Electronically Signed   By: Garald Balding M.D.   On: 12/07/2018 03:06   Ct Cervical Spine Wo Contrast  Result Date: 12/07/2018 CLINICAL DATA:  Status post unwitnessed fall. Nasal injury and laceration at the forehead. Concern for head or cervical spine injury. Initial encounter. EXAM: CT HEAD WITHOUT CONTRAST CT MAXILLOFACIAL WITHOUT CONTRAST CT CERVICAL SPINE WITHOUT CONTRAST TECHNIQUE: Multidetector CT imaging of the head, cervical spine, and maxillofacial structures were performed  using the standard protocol without intravenous contrast. Multiplanar CT image reconstructions of the cervical spine and maxillofacial structures were also generated.  COMPARISON:  CT of the head and cervical spine performed 09/30/2018 FINDINGS: CT HEAD FINDINGS Brain: No evidence of acute infarction, hemorrhage, hydrocephalus, extra-axial collection or mass lesion / mass effect. Prominence of the ventricles and sulci reflects mild to moderate cortical volume loss. Mild cerebellar atrophy is noted. Scattered periventricular and subcortical white matter change likely reflects small vessel ischemic microangiopathy. The brainstem and fourth ventricle are within normal limits. The basal ganglia are unremarkable in appearance. The cerebral hemispheres demonstrate grossly normal gray-white differentiation. No mass effect or midline shift is seen. Vascular: No hyperdense vessel or unexpected calcification. Skull: There is no evidence of fracture; visualized osseous structures are unremarkable in appearance. Other: Soft tissue swelling is noted overlying the right frontal calvarium. CT MAXILLOFACIAL FINDINGS Osseous: There is no evidence of fracture or dislocation. The maxilla and mandible appear intact. The nasal bone is unremarkable in appearance. The visualized dentition demonstrates no acute abnormality. Evaluation is mildly suboptimal due to motion artifact. Orbits: The orbits are intact bilaterally. Bilateral proptosis is noted. Sinuses: Mucosal thickening is noted at the right maxillary sinus. The remaining visualized paranasal sinuses and mastoid air cells are well-aerated. Soft tissues: Mild soft tissue swelling is noted about the nose. The parapharyngeal fat planes are preserved. The nasopharynx, oropharynx and hypopharynx are unremarkable in appearance. The visualized portions of the valleculae and piriform sinuses are grossly unremarkable. The parotid and submandibular glands are within normal limits. No cervical  lymphadenopathy is seen. CT CERVICAL SPINE FINDINGS Alignment: Normal. Skull base and vertebrae: No acute fracture. No primary bone lesion or focal pathologic process. Soft tissues and spinal canal: No prevertebral fluid or swelling. No visible canal hematoma. Disc levels: Multilevel disc space narrowing is noted along the mid to lower cervical spine, with mild grade 1 anterolisthesis of C7 on T1, reflecting underlying facet disease. Degenerative change is noted about the dens. Upper chest: Mild scarring is noted at the lung apices. The thyroid gland is grossly unremarkable. Mild calcification is seen at the carotid bifurcations bilaterally. Other: No additional soft tissue abnormalities are seen. IMPRESSION: 1. No evidence of traumatic intracranial injury or fracture. 2. No evidence of fracture or dislocation with regard to the maxillofacial structures. 3. No evidence of fracture or subluxation along the cervical spine. 4. Soft tissue swelling overlying the right frontal calvarium, and mild soft tissue swelling about the nose. 5. Mild to moderate cortical volume loss and scattered small vessel ischemic microangiopathy. 6. Bilateral proptosis noted. 7. Mucosal thickening at the right maxillary sinus. 8. Mild degenerative change along the mid to lower cervical spine. 9. Mild calcification at the carotid bifurcations bilaterally. Electronically Signed   By: Garald Balding M.D.   On: 12/07/2018 03:06   Dg Hips Bilat W Or Wo Pelvis 2 Views  Result Date: 12/07/2018 CLINICAL DATA:  Recent fall EXAM: DG HIP (WITH OR WITHOUT PELVIS) 2V BILAT COMPARISON:  None. FINDINGS: Pelvic ring is intact. Bilateral hip replacements are noted. No fracture or dislocation is seen. No definitive loosening is noted. No soft tissue changes are seen. Degenerative change in the lumbar spine is seen. IMPRESSION: Bilateral hip replacements without acute abnormality. Electronically Signed   By: Inez Catalina M.D.   On: 12/07/2018 02:53   Ct  Maxillofacial Wo Contrast  Result Date: 12/07/2018 CLINICAL DATA:  Status post unwitnessed fall. Nasal injury and laceration at the forehead. Concern for head or cervical spine injury. Initial encounter. EXAM: CT HEAD WITHOUT CONTRAST CT MAXILLOFACIAL WITHOUT CONTRAST CT CERVICAL SPINE WITHOUT CONTRAST TECHNIQUE: Multidetector  CT imaging of the head, cervical spine, and maxillofacial structures were performed using the standard protocol without intravenous contrast. Multiplanar CT image reconstructions of the cervical spine and maxillofacial structures were also generated. COMPARISON:  CT of the head and cervical spine performed 09/30/2018 FINDINGS: CT HEAD FINDINGS Brain: No evidence of acute infarction, hemorrhage, hydrocephalus, extra-axial collection or mass lesion / mass effect. Prominence of the ventricles and sulci reflects mild to moderate cortical volume loss. Mild cerebellar atrophy is noted. Scattered periventricular and subcortical white matter change likely reflects small vessel ischemic microangiopathy. The brainstem and fourth ventricle are within normal limits. The basal ganglia are unremarkable in appearance. The cerebral hemispheres demonstrate grossly normal gray-white differentiation. No mass effect or midline shift is seen. Vascular: No hyperdense vessel or unexpected calcification. Skull: There is no evidence of fracture; visualized osseous structures are unremarkable in appearance. Other: Soft tissue swelling is noted overlying the right frontal calvarium. CT MAXILLOFACIAL FINDINGS Osseous: There is no evidence of fracture or dislocation. The maxilla and mandible appear intact. The nasal bone is unremarkable in appearance. The visualized dentition demonstrates no acute abnormality. Evaluation is mildly suboptimal due to motion artifact. Orbits: The orbits are intact bilaterally. Bilateral proptosis is noted. Sinuses: Mucosal thickening is noted at the right maxillary sinus. The remaining  visualized paranasal sinuses and mastoid air cells are well-aerated. Soft tissues: Mild soft tissue swelling is noted about the nose. The parapharyngeal fat planes are preserved. The nasopharynx, oropharynx and hypopharynx are unremarkable in appearance. The visualized portions of the valleculae and piriform sinuses are grossly unremarkable. The parotid and submandibular glands are within normal limits. No cervical lymphadenopathy is seen. CT CERVICAL SPINE FINDINGS Alignment: Normal. Skull base and vertebrae: No acute fracture. No primary bone lesion or focal pathologic process. Soft tissues and spinal canal: No prevertebral fluid or swelling. No visible canal hematoma. Disc levels: Multilevel disc space narrowing is noted along the mid to lower cervical spine, with mild grade 1 anterolisthesis of C7 on T1, reflecting underlying facet disease. Degenerative change is noted about the dens. Upper chest: Mild scarring is noted at the lung apices. The thyroid gland is grossly unremarkable. Mild calcification is seen at the carotid bifurcations bilaterally. Other: No additional soft tissue abnormalities are seen. IMPRESSION: 1. No evidence of traumatic intracranial injury or fracture. 2. No evidence of fracture or dislocation with regard to the maxillofacial structures. 3. No evidence of fracture or subluxation along the cervical spine. 4. Soft tissue swelling overlying the right frontal calvarium, and mild soft tissue swelling about the nose. 5. Mild to moderate cortical volume loss and scattered small vessel ischemic microangiopathy. 6. Bilateral proptosis noted. 7. Mucosal thickening at the right maxillary sinus. 8. Mild degenerative change along the mid to lower cervical spine. 9. Mild calcification at the carotid bifurcations bilaterally. Electronically Signed   By: Garald Balding M.D.   On: 12/07/2018 03:06    Procedures .Marland KitchenLaceration Repair Date/Time: 12/07/2018 6:41 AM Performed by: Deliah Boston,  PA-C Authorized by: Deliah Boston, PA-C   Consent:    Consent obtained:  Emergent situation Anesthesia (see MAR for exact dosages):    Anesthesia method:  Local infiltration   Local anesthetic:  Lidocaine 1% w/o epi Laceration details:    Location:  Face   Face location:  Forehead   Length (cm):  5   Depth (mm):  5 Repair type:    Repair type:  Intermediate Pre-procedure details:    Preparation:  Patient was prepped and draped in  usual sterile fashion and imaging obtained to evaluate for foreign bodies Exploration:    Hemostasis achieved with:  Direct pressure   Wound exploration: wound explored through full range of motion and entire depth of wound probed and visualized     Wound extent: no foreign bodies/material noted, no muscle damage noted, no nerve damage noted, no tendon damage noted, no underlying fracture noted and no vascular damage noted     Contaminated: no   Treatment:    Area cleansed with:  Saline and Shur-Clens   Amount of cleaning:  Extensive   Irrigation solution:  Sterile saline   Irrigation volume:  537ml   Irrigation method:  Pressure wash Skin repair:    Repair method:  Sutures   Suture size:  4-0   Wound skin closure material used: Ethlion.   Suture technique:  Simple interrupted   Number of sutures:  4 Approximation:    Approximation:  Close Post-procedure details:    Dressing:  Non-adherent dressing   Patient tolerance of procedure:  Tolerated with difficulty Comments:     Assisted by Dr. Roxanne Mins.   (including critical care time)  Medications Ordered in ED Medications  Tdap (BOOSTRIX) injection 0.5 mL (0.5 mLs Intramuscular Given 12/07/18 0304)  lidocaine-EPINEPHrine-tetracaine (LET) solution (3 mLs Topical Given 12/07/18 0425)  lidocaine (PF) (XYLOCAINE) 1 % injection 5 mL (5 mLs Infiltration Given by Other 12/07/18 0557)   Initial Impression / Assessment and Plan / ED Course  I have reviewed the triage vital signs and the nursing  notes.  Pertinent labs & imaging results that were available during my care of the patient were reviewed by me and considered in my medical decision making (see chart for details).    84 year old with history of dementia presenting after unwitnessed fall.  Patient with hematoma and laceration to forehead.  Patient appears to be at her baseline with dementia per nursing staff.  All major joints were palpated and brought through range of motion without crepitus or deformity or increase in pain.  CT head/maxillofacial/cervical spine: Without acute findings.  Images did show soft tissue swelling about the front calvarium and nose.  Incidental findings include degenerative changes, mild calcifications and bilateral proptosis. Patient noted to have history of chronic proptosis, old charts reviewed from 09/18/2017, ophthalmology notes proptosis and then start.  Chest x-ray nonacute DG hips nonacute Lactic acid within normal limits BMP nonacute CBC within normal notes Urinalysis nonacute, discussed with Dr. Roxanne Mins, does not warrant abx. Sent for culture.  Patient with gaping Y-shaped laceration of the forehead. Wound thoroughly cleaned in ED today. Wound explored and bottom of wound seen in a bloodless field. Laceration repaired as dictated above.  AVS containing home wound care instructions. Follow up with PCP/urgent care or return to ER for suture removal in 5 days.  AVS given urged to return to the Emergency Department for worsening pain, swelling, expanding erythema especially if it streaks away from the affected area, fever, or for any additional concerns. Assisted with procedure by Dr. Roxanne Mins. Facial laceration, not contaminated and cleaned today, abx not warranted. Tdap updated.  Additionally patient with conjunctival erythema and bilateral discharge. Will prescribe erythromycin ointment..  Personal hygiene and frequent handwashing discussed.  AVS advised to followup with primary care provider this  week. Discussed with Dr. Roxanne Mins who agrees with erythromycin.  At this time there does not appear to be any evidence of an acute emergency medical condition and the patient appears stable for discharge with appropriate outpatient follow up.  Diagnosis on AVS.. I have discussed return precautions on AVS who verbalize understanding of return precautions. AVS strongly encourages to follow-up with their PCP this week.  Patient's case discussed with Dr. Roxanne Mins who agrees with plan to discharge with follow-up.   Note: Portions of this report may have been transcribed using voice recognition software. Every effort was made to ensure accuracy; however, inadvertent computerized transcription errors may still be present. Final Clinical Impressions(s) / ED Diagnoses   Final diagnoses:  Fall, initial encounter  Facial laceration, initial encounter  Acute conjunctivitis of both eyes, unspecified acute conjunctivitis type    ED Discharge Orders         Ordered    erythromycin ophthalmic ointment  5 times daily     12/07/18 0701           Gari Crown 43/15/40 0867    Delora Fuel, MD 61/95/09 [phone]

## 2018-12-09 ENCOUNTER — Telehealth: Payer: Self-pay

## 2018-12-09 LAB — URINE CULTURE
Culture: >=100000 — AB
Special Requests: NORMAL

## 2018-12-09 NOTE — Telephone Encounter (Signed)
Follow up appointment made for laceration to head with fall per ED note.

## 2018-12-13 DIAGNOSIS — S0181XA Laceration without foreign body of other part of head, initial encounter: Secondary | ICD-10-CM | POA: Diagnosis not present

## 2018-12-13 DIAGNOSIS — F0631 Mood disorder due to known physiological condition with depressive features: Secondary | ICD-10-CM | POA: Diagnosis not present

## 2018-12-13 DIAGNOSIS — Z760 Encounter for issue of repeat prescription: Secondary | ICD-10-CM | POA: Diagnosis not present

## 2018-12-13 DIAGNOSIS — F0281 Dementia in other diseases classified elsewhere with behavioral disturbance: Secondary | ICD-10-CM | POA: Diagnosis not present

## 2018-12-13 DIAGNOSIS — E0590 Thyrotoxicosis, unspecified without thyrotoxic crisis or storm: Secondary | ICD-10-CM | POA: Diagnosis not present

## 2018-12-16 ENCOUNTER — Ambulatory Visit: Payer: Medicare Other | Admitting: Internal Medicine

## 2018-12-16 DIAGNOSIS — Z0289 Encounter for other administrative examinations: Secondary | ICD-10-CM

## 2019-01-16 ENCOUNTER — Emergency Department (HOSPITAL_COMMUNITY): Payer: Medicare Other

## 2019-01-16 ENCOUNTER — Other Ambulatory Visit: Payer: Self-pay

## 2019-01-16 ENCOUNTER — Emergency Department (HOSPITAL_COMMUNITY)
Admission: EM | Admit: 2019-01-16 | Discharge: 2019-01-17 | Disposition: A | Discharge status: Home / Self Care | Payer: Medicare Other | Attending: Emergency Medicine | Admitting: Emergency Medicine

## 2019-01-16 ENCOUNTER — Encounter (HOSPITAL_COMMUNITY): Payer: Self-pay

## 2019-01-16 DIAGNOSIS — E039 Hypothyroidism, unspecified: Secondary | ICD-10-CM | POA: Diagnosis not present

## 2019-01-16 DIAGNOSIS — Z96659 Presence of unspecified artificial knee joint: Secondary | ICD-10-CM | POA: Diagnosis not present

## 2019-01-16 DIAGNOSIS — I10 Essential (primary) hypertension: Secondary | ICD-10-CM | POA: Insufficient documentation

## 2019-01-16 DIAGNOSIS — F329 Major depressive disorder, single episode, unspecified: Secondary | ICD-10-CM | POA: Diagnosis not present

## 2019-01-16 DIAGNOSIS — W050XXA Fall from non-moving wheelchair, initial encounter: Secondary | ICD-10-CM | POA: Insufficient documentation

## 2019-01-16 DIAGNOSIS — G309 Alzheimer's disease, unspecified: Secondary | ICD-10-CM | POA: Diagnosis not present

## 2019-01-16 DIAGNOSIS — H66001 Acute suppurative otitis media without spontaneous rupture of ear drum, right ear: Secondary | ICD-10-CM | POA: Insufficient documentation

## 2019-01-16 DIAGNOSIS — W19XXXA Unspecified fall, initial encounter: Secondary | ICD-10-CM

## 2019-01-16 DIAGNOSIS — Z79899 Other long term (current) drug therapy: Secondary | ICD-10-CM | POA: Diagnosis not present

## 2019-01-16 DIAGNOSIS — F0280 Dementia in other diseases classified elsewhere without behavioral disturbance: Secondary | ICD-10-CM | POA: Insufficient documentation

## 2019-01-16 DIAGNOSIS — M25511 Pain in right shoulder: Secondary | ICD-10-CM | POA: Diagnosis present

## 2019-01-16 NOTE — ED Notes (Signed)
Bed: KU57 Expected date:  Expected time:  Means of arrival:  Comments: 85 yo F/R-shoulder pain from [facility]

## 2019-01-16 NOTE — ED Triage Notes (Signed)
Witnessed fall -LOC -Blood thinners. R shoulder pain. R shoulder fx 2 months ago. Has brace at home, but doesn't need to wear it anymore. Pt oriented at baseline per facility.

## 2019-01-16 NOTE — ED Provider Notes (Signed)
[facility] DEPT Provider Note   CSN: [id number] Arrival date & time: 01/16/19  2250     History   Chief Complaint Chief Complaint  Patient presents with  . Fall    HPI Lisa [name] is a 85 y.o. female.  HPI  85 yo F with h/o dementia, severe glaucoma, here w/ fall. History limited 2/2 dementia. Per report, pt had a witnessed fall from wheelchair. No LOC. No obvious trauma. C/o R shoulder pain - initially described it as sharp, worse w/ movement. No alleviating factors. History limited 2/2 dementia.  Level 5 caveat invoked as remainder of history, ROS, and physical exam limited due to patient's dementia.   Past Medical History:  Diagnosis Date  . Dementia ([place])   . Glaucoma   . HYPERLIPIDEMIA 11/23/2007  . HYPERTENSION 11/23/2007  . HYPOTHYROIDISM 11/23/2007  . LOW BACK PAIN 11/23/2007  . Macular degeneration   . OSTEOARTHRITIS 11/23/2007  . PERIPHERAL VASCULAR DISEASE 07/11/2008    Patient Active Problem List   Diagnosis Date Noted  . Left wrist injury, initial encounter 06/03/2017  . Distal radius fracture, left 06/03/2017  . Depression 09/02/2016  . Well adult exam 08/31/2014  . Fatigue 07/22/2011  . Syncopal episodes 06/25/2011  . Hyponatremia 06/25/2011  . Alzheimer disease ([place]) 06/25/2011  . DIARRHEA 08/01/2008  . PERIPHERAL VASCULAR DISEASE 07/11/2008  . NEOPLASM OF UNCERTAIN BEHAVIOR OF SKIN 05/23/2008  . OTHER NONTHROMBOCYTOPENIC PURPURAS 05/23/2008  . MYALGIA 05/23/2008  . Hypothyroidism 11/23/2007  . HYPERLIPIDEMIA 11/23/2007  . Essential hypertension 11/23/2007  . OSTEOARTHRITIS 11/23/2007  . LOW BACK PAIN 11/23/2007    Past Surgical History:  Procedure Laterality Date  . LUMBAR FUSION    . TOTAL KNEE ARTHROPLASTY       OB History   No obstetric history on file.      Home Medications    Prior to Admission medications   Medication Sig Start Date End Date Taking? Authorizing Provider  acetaminophen  (TYLENOL) 325 MG tablet Take 2 tablets (650 mg total) by mouth every 6 (six) hours as needed for moderate pain. Patient not taking: Reported on 12/07/2018 06/07/18   Langston Masker B, PA-C  acetaminophen (TYLENOL) 500 MG tablet Take 500 mg by mouth every 6 (six) hours. Not to exceed 2000 mg per 24 hours     [provider]  alum & mag hydroxide-simeth (Zena) 200-200-20 MG/5ML suspension Take 30 mLs by mouth every 6 (six) hours as needed for indigestion or heartburn (Do not exceed 4 doses in 24 hours.).     [provider]  amLODipine (NORVASC) 5 MG tablet Take 1 tablet (5 mg total) by mouth daily. Patient taking differently: Take 7.5 mg by mouth daily.  08/24/17   Plotnikov, Evie Lacks, MD  amoxicillin-clavulanate (AUGMENTIN) 875-125 MG tablet Take 1 tablet by mouth every 12 (twelve) hours for 10 days. 01/17/19 01/27/19  Duffy Bruce, MD  Cholecalciferol (VITAMIN D3) 1000 UNITS CAPS Take 1 capsule by mouth daily. Patient taking differently: Take 1,000 Units by mouth daily.  09/16/11   Plotnikov, Evie Lacks, MD  escitalopram (LEXAPRO) 10 MG tablet Take 10 mg by mouth daily.    [provider]  guaifenesin (ROBITUSSIN) 100 MG/5ML syrup Take 200 mg by mouth every 6 (six) hours as needed for cough. Not to exceed 4 doses in 24 hours    [provider]  loperamide (IMODIUM) 2 MG capsule Take 2 mg by mouth as needed for diarrhea or loose stools (Do not exceed  8 doses in 24 hours.).     [provider]  losartan (COZAAR) 100 MG tablet TAKE 1 TABLET BY MOUTH EVERY DAY Patient taking differently: Take 100 mg by mouth daily.  09/15/17   Plotnikov, Evie Lacks, MD  Loteprednol Etabonate (LOTEMAX) 0.5 % OINT Place 1 strip into both eyes 2 (two) times daily.    [provider]  magnesium hydroxide (MILK OF MAGNESIA) 400 MG/5ML suspension Take 30 mLs by mouth at bedtime as needed for mild constipation.    [provider]  memantine (NAMENDA XR) 14 MG CP24 24  hr capsule Take 14 mg by mouth daily.     [provider]  Menthol, Topical Analgesic, (BIOFREEZE) 4 % GEL Apply 1 application topically 3 (three) times daily as needed (left hip pain).    [provider]  methimazole (TAPAZOLE) 5 MG tablet Take 5 mg by mouth 3 (three) times daily.  09/03/18   [provider]  metoprolol succinate (TOPROL-XL) 25 MG 24 hr tablet Take 25 mg by mouth daily. 09/03/18   [provider]  neomycin-bacitracin-polymyxin (NEOSPORIN) ointment Apply 1 application topically as needed for wound care.    [provider]  OVER THE COUNTER MEDICATION Take 1 Can by mouth 4 (four) times daily. House Shake    [provider]  Travoprost, BAK Free, (TRAVATAN Z) 0.004 % SOLN ophthalmic solution Place 2 drops into both eyes every morning.     [provider]  traZODone (DESYREL) 50 MG tablet Take 1-2 tablets (50-100 mg total) by mouth at bedtime. Patient taking differently: Take 50 mg by mouth at bedtime.  10/09/17   Plotnikov, Evie Lacks, MD    Family History Family History  Problem Relation Age of Onset  . Hyperlipidemia Mother   . Diabetes Father   . Hypertension Other     Social History Social History   Tobacco Use  . Smoking status: Never Smoker  . Smokeless tobacco: Never Used  Substance Use Topics  . Alcohol use: No  . Drug use: No     Allergies   Aricept [donepezil hydrochloride]   Review of Systems Review of Systems  Unable to perform ROS: Dementia     Physical Exam Updated Vital Signs BP (!) 175/72 (BP Location: Right Arm)   Pulse 70   Temp 98 F (36.7 C) (Oral)   Resp 16   SpO2 98%   Physical Exam Vitals signs and nursing note reviewed.  Constitutional:      General: She is not in acute distress.    Appearance: She is well-developed.  HENT:     Head: Normocephalic and atraumatic.     Comments: No obvious head trauma Eyes:     Conjunctiva/sclera: Conjunctivae normal.  Neck:      Musculoskeletal: Neck supple.  Cardiovascular:     Rate and Rhythm: Normal rate and regular rhythm.     Heart sounds: Normal heart sounds.  Pulmonary:     Effort: Pulmonary effort is normal. No respiratory distress.     Breath sounds: No wheezing.  Abdominal:     General: Abdomen is flat. There is no distension.  Musculoskeletal:     Comments: Mild TTP R shoulder, no bruising or deformity. No TTP throughout C, T, L spine. No hip pain or TTP.  Skin:    General: Skin is warm.     Capillary Refill: Capillary refill takes less than 2 seconds.     Findings: No rash.  Neurological:  Mental Status: She is alert and oriented to person, place, and time.     Motor: No abnormal muscle tone.      ED Treatments / Results  Labs (all labs ordered are listed, but only abnormal results are displayed) Labs Reviewed - No data to display  EKG None  Radiology Dg Chest 2 View  Result Date: 01/16/2019 CLINICAL DATA:  Witnessed fall with right shoulder pain. Right shoulder fracture 2 months ago. EXAM: CHEST - 2 VIEW COMPARISON:  12/07/2018 FINDINGS: Normal heart size and pulmonary vascularity. No focal airspace disease or consolidation in the lungs. No blunting of costophrenic angles. No pneumothorax. Mediastinal contours appear intact. Degenerative changes in the spine and shoulders. Aortic calcification. IMPRESSION: No active cardiopulmonary disease. Electronically Signed   By: Lucienne Capers M.D.   On: 01/16/2019 23:56   Dg Shoulder Right  Result Date: 01/16/2019 CLINICAL DATA:  Right shoulder pain after a fall. Right shoulder fracture 2 months ago. EXAM: RIGHT SHOULDER - 2+ VIEW COMPARISON:  None. FINDINGS: Degenerative changes in the glenohumeral joint with joint space narrowing, sclerosis, and osteophyte formation. No evidence of acute fracture or dislocation. No focal bone lesion or bone destruction. Coracoclavicular and acromioclavicular spaces are maintained. IMPRESSION: Degenerative  changes in the glenohumeral joint. No acute bony abnormalities. Electronically Signed   By: Lucienne Capers M.D.   On: 01/16/2019 23:57   Ct Head Wo Contrast  Result Date: 01/17/2019 CLINICAL DATA:  Fall. On blood thinners. EXAM: CT HEAD WITHOUT CONTRAST CT CERVICAL SPINE WITHOUT CONTRAST TECHNIQUE: Multidetector CT imaging of the head and cervical spine was performed following the standard protocol without intravenous contrast. Multiplanar CT image reconstructions of the cervical spine were also generated. COMPARISON:  12/07/2018 FINDINGS: CT HEAD FINDINGS Brain: Diffuse cerebral atrophy. Ventricular dilatation consistent with central atrophy. Low-attenuation changes throughout the deep white matter consistent small vessel ischemia. No mass effect or midline shift. No abnormal extra-axial fluid collections. Gray-white matter junctions are distinct. Basal cisterns are not effaced. No acute intracranial hemorrhage. Motion artifact limits examination. Vascular: Moderate intracranial arterial vascular calcifications are present. Skull: Calvarium appears intact. No acute depressed skull fractures. Sinuses/Orbits: Paranasal sinuses are clear. Opacification of right mastoid air cells as well as right middle ear. Left mastoids are clear. Other: None. CT CERVICAL SPINE FINDINGS Alignment: Straightening of usual cervical lordosis with slight anterior subluxation of C4 on C5, C6 on C7, and C7 on T1. Alignment is unchanged since previous study, likely degenerative. Normal alignment of the facet joints. Loss of the space between the anterior arch of C1 and the odontoid process with hypertrophic changes, likely degenerative. Skull base and vertebrae: Skull base appears intact. No vertebral compression deformities. No focal bone lesion or bone destruction. Bone cortex appears intact. Soft tissues and spinal canal: No prevertebral soft tissue swelling. No abnormal paraspinal soft tissue mass or infiltration. Disc levels:  Degenerative changes throughout the cervical spine with narrowed cervical interspaces and endplate hypertrophic changes. Degenerative changes at C1-2 and throughout the cervical facet joints. Upper chest: Lung apices are clear. Vascular calcifications. Other: None. IMPRESSION: 1. No acute intracranial abnormalities. Chronic atrophy and small vessel ischemic changes. 2. Opacification of right mastoid air cells and right middle ear, possibly due to effusions or inflammatory change. 3. Degenerative changes throughout the cervical spine. Alignment is unchanged since previous study. No acute displaced fractures identified. Electronically Signed   By: Lucienne Capers M.D.   On: 01/17/2019 00:23   Ct Cervical Spine Wo Contrast  Result Date: 01/17/2019 CLINICAL  DATA:  Fall. On blood thinners. EXAM: CT HEAD WITHOUT CONTRAST CT CERVICAL SPINE WITHOUT CONTRAST TECHNIQUE: Multidetector CT imaging of the head and cervical spine was performed following the standard protocol without intravenous contrast. Multiplanar CT image reconstructions of the cervical spine were also generated. COMPARISON:  12/07/2018 FINDINGS: CT HEAD FINDINGS Brain: Diffuse cerebral atrophy. Ventricular dilatation consistent with central atrophy. Low-attenuation changes throughout the deep white matter consistent small vessel ischemia. No mass effect or midline shift. No abnormal extra-axial fluid collections. Gray-white matter junctions are distinct. Basal cisterns are not effaced. No acute intracranial hemorrhage. Motion artifact limits examination. Vascular: Moderate intracranial arterial vascular calcifications are present. Skull: Calvarium appears intact. No acute depressed skull fractures. Sinuses/Orbits: Paranasal sinuses are clear. Opacification of right mastoid air cells as well as right middle ear. Left mastoids are clear. Other: None. CT CERVICAL SPINE FINDINGS Alignment: Straightening of usual cervical lordosis with slight anterior  subluxation of C4 on C5, C6 on C7, and C7 on T1. Alignment is unchanged since previous study, likely degenerative. Normal alignment of the facet joints. Loss of the space between the anterior arch of C1 and the odontoid process with hypertrophic changes, likely degenerative. Skull base and vertebrae: Skull base appears intact. No vertebral compression deformities. No focal bone lesion or bone destruction. Bone cortex appears intact. Soft tissues and spinal canal: No prevertebral soft tissue swelling. No abnormal paraspinal soft tissue mass or infiltration. Disc levels: Degenerative changes throughout the cervical spine with narrowed cervical interspaces and endplate hypertrophic changes. Degenerative changes at C1-2 and throughout the cervical facet joints. Upper chest: Lung apices are clear. Vascular calcifications. Other: None. IMPRESSION: 1. No acute intracranial abnormalities. Chronic atrophy and small vessel ischemic changes. 2. Opacification of right mastoid air cells and right middle ear, possibly due to effusions or inflammatory change. 3. Degenerative changes throughout the cervical spine. Alignment is unchanged since previous study. No acute displaced fractures identified. Electronically Signed   By: Lucienne Capers M.D.   On: 01/17/2019 00:23    Procedures Procedures (including critical care time)  Medications Ordered in ED Medications - No data to display   Initial Impression / Assessment and Plan / ED Course  I have reviewed the triage vital signs and the nursing notes.  Pertinent labs & imaging results that were available during my care of the patient were reviewed by me and considered in my medical decision making (see chart for details).     85 year old female here with fall, witnessed.  Patient has a history of recurrent falls.  She has baseline dementia.  She denies any complaints other than chronic right shoulder pain on my exam.  She is reportedly well prior to and since the  fall.  CT imaging today shows no acute abnormality.  Of note, she incidentally is read as having a middle ear effusion on the right.  She has no mastoid tenderness or erythema on this side, but does have mild opacification of her tympanic membrane.  Does not appear overtly erythematous but given her age, will cover for possible early bacterial otitis media.  No evidence of meningitis or encephalitis.  Her vital signs are stable.  She is to be discharged with close outpatient follow-up.  Good return precautions.  Final Clinical Impressions(s) / ED Diagnoses   Final diagnoses:  Fall, initial encounter  Non-recurrent acute suppurative otitis media of right ear without spontaneous rupture of tympanic membrane    ED Discharge Orders         Ordered  amoxicillin-clavulanate (AUGMENTIN) 875-125 MG tablet  Every 12 hours     01/17/19 0232           Duffy Bruce, MD 01/17/19 [phone]

## 2019-01-17 DIAGNOSIS — H66001 Acute suppurative otitis media without spontaneous rupture of ear drum, right ear: Secondary | ICD-10-CM | POA: Diagnosis not present

## 2019-01-17 MED ORDER — AMOXICILLIN-POT CLAVULANATE 875-125 MG PO TABS: 1.0000 | ORAL_TABLET | Freq: Two times a day (BID) | ORAL | 0 refills | Status: AC

## 2019-01-17 NOTE — ED Notes (Signed)
Attempted to call [facility] twice now referencing the patient to obtain more information about baseline condition. I received no answer both times. I did leave a message to return my call.

## 2019-01-17 NOTE — ED Notes (Signed)
Patient transported to CT 

## 2019-01-17 NOTE — ED Notes (Signed)
Report given to facility RN, Kathlee Nations. Discharge information reviewed with this RN. Pt will return to Memory Care section of the facility

## 2019-01-17 NOTE — ED Notes (Signed)
Call placed to [facility] for transport. °

## 2019-01-21 ENCOUNTER — Emergency Department (HOSPITAL_COMMUNITY)
Admission: EM | Admit: 2019-01-21 | Discharge: 2019-01-22 | Disposition: A | Discharge status: Home / Self Care | Payer: Medicare Other | Attending: Emergency Medicine | Admitting: Emergency Medicine

## 2019-01-21 ENCOUNTER — Encounter (HOSPITAL_COMMUNITY): Payer: Self-pay

## 2019-01-21 DIAGNOSIS — Y939 Activity, unspecified: Secondary | ICD-10-CM | POA: Diagnosis not present

## 2019-01-21 DIAGNOSIS — Z79899 Other long term (current) drug therapy: Secondary | ICD-10-CM | POA: Insufficient documentation

## 2019-01-21 DIAGNOSIS — S0003XA Contusion of scalp, initial encounter: Secondary | ICD-10-CM | POA: Diagnosis not present

## 2019-01-21 DIAGNOSIS — W19XXXA Unspecified fall, initial encounter: Secondary | ICD-10-CM | POA: Diagnosis not present

## 2019-01-21 DIAGNOSIS — F0390 Unspecified dementia without behavioral disturbance: Secondary | ICD-10-CM | POA: Insufficient documentation

## 2019-01-21 DIAGNOSIS — Y998 Other external cause status: Secondary | ICD-10-CM | POA: Diagnosis not present

## 2019-01-21 DIAGNOSIS — Y92122 Bedroom in nursing home as the place of occurrence of the external cause: Secondary | ICD-10-CM | POA: Insufficient documentation

## 2019-01-21 DIAGNOSIS — E039 Hypothyroidism, unspecified: Secondary | ICD-10-CM | POA: Diagnosis not present

## 2019-01-21 DIAGNOSIS — I10 Essential (primary) hypertension: Secondary | ICD-10-CM | POA: Insufficient documentation

## 2019-01-21 DIAGNOSIS — E785 Hyperlipidemia, unspecified: Secondary | ICD-10-CM | POA: Diagnosis not present

## 2019-01-21 DIAGNOSIS — S0990XA Unspecified injury of head, initial encounter: Secondary | ICD-10-CM | POA: Diagnosis present

## 2019-01-21 LAB — CBG MONITORING, ED: Glucose-Capillary: 137 mg/dL — ABNORMAL HIGH (ref 70–99)

## 2019-01-21 NOTE — ED Triage Notes (Signed)
Pt arrived via [facility]; pt from [facility] a/p fall (unwitnessed). Per staff pt was in bed and attempted to get up causing fall. Pt found on floor after yelling fr help. EMS reports pt on no blood thinners. Pt has had multiple falls and recently released for falling; 210/70, 70, no SpO2 or CBG obtained.

## 2019-01-22 ENCOUNTER — Emergency Department (HOSPITAL_COMMUNITY): Payer: Medicare Other

## 2019-01-22 DIAGNOSIS — S0003XA Contusion of scalp, initial encounter: Secondary | ICD-10-CM | POA: Diagnosis not present

## 2019-01-22 LAB — URINALYSIS, ROUTINE W REFLEX MICROSCOPIC
Bacteria, UA: NONE SEEN
Bilirubin Urine: NEGATIVE
Glucose, UA: NEGATIVE mg/dL
Hgb urine dipstick: NEGATIVE
Ketones, ur: NEGATIVE mg/dL
Leukocytes, UA: NEGATIVE
Nitrite: NEGATIVE
Protein, ur: 30 mg/dL — AB
Specific Gravity, Urine: 1.009 (ref 1.005–1.030)
pH: 8 (ref 5.0–8.0)

## 2019-01-22 LAB — CBC WITH DIFFERENTIAL/PLATELET
Abs Immature Granulocytes: 0.03 10*3/uL (ref 0.00–0.07)
Basophils Absolute: 0 10*3/uL (ref 0.0–0.1)
Basophils Relative: 0 %
EOS ABS: 0.1 10*3/uL (ref 0.0–0.5)
Eosinophils Relative: 2 %
HCT: 38.7 % (ref 36.0–46.0)
Hemoglobin: 12.6 g/dL (ref 12.0–15.0)
Immature Granulocytes: 1 %
Lymphocytes Relative: 22 %
Lymphs Abs: 1.4 10*3/uL (ref 0.7–4.0)
MCH: 30.1 pg (ref 26.0–34.0)
MCHC: 32.6 g/dL (ref 30.0–36.0)
MCV: 92.6 fL (ref 80.0–100.0)
MONO ABS: 0.7 10*3/uL (ref 0.1–1.0)
Monocytes Relative: 10 %
Neutro Abs: 4.1 10*3/uL (ref 1.7–7.7)
Neutrophils Relative %: 65 %
Platelets: 238 10*3/uL (ref 150–400)
RBC: 4.18 MIL/uL (ref 3.87–5.11)
RDW: 14.8 % (ref 11.5–15.5)
WBC: 6.3 10*3/uL (ref 4.0–10.5)
nRBC: 0 % (ref 0.0–0.2)

## 2019-01-22 LAB — COMPREHENSIVE METABOLIC PANEL
ALT: 18 U/L (ref 0–44)
AST: 24 U/L (ref 15–41)
Albumin: 3.6 g/dL (ref 3.5–5.0)
Alkaline Phosphatase: 112 U/L (ref 38–126)
Anion gap: 11 (ref 5–15)
BUN: 21 mg/dL (ref 8–23)
CO2: 25 mmol/L (ref 22–32)
Calcium: 8.9 mg/dL (ref 8.9–10.3)
Chloride: 102 mmol/L (ref 98–111)
Creatinine, Ser: 0.91 mg/dL (ref 0.44–1.00)
GFR calc Af Amer: >60 mL/min (ref >60)
GFR calc non Af Amer: 56 mL/min — ABNORMAL LOW (ref >60)
Glucose, Bld: 124 mg/dL — ABNORMAL HIGH (ref 70–99)
POTASSIUM: 3.5 mmol/L (ref 3.5–5.1)
Sodium: 138 mmol/L (ref 135–145)
Total Bilirubin: 0.4 mg/dL (ref 0.3–1.2)
Total Protein: 6.9 g/dL (ref 6.5–8.1)

## 2019-01-22 NOTE — ED Provider Notes (Signed)
[facility] EMERGENCY DEPARTMENT Provider Note   CSN: [id number] Arrival date & time: 01/21/19  2314     History   Chief Complaint Chief Complaint  Patient presents with  . Fall    HPI Lisa [name] is a 85 y.o. female with a hx of dementia, HTN, blood, glaucoma leading to blindness, peripheral vascular disease presents to the Emergency Department via EMS after unwitnessed fall.  Pt initially c/o headache, but denies one when questioned at time of history taking.  Level 5 caveat for dementia.    Discussed with Kathlee Nations, Librarian, academic at [employer].  She reports the patient was having trouble sleeping and was up and down several times.  Staff heard her yelling for help and found her on the floor by her bed.  They report pt is normally able to orient to person, but not to place or event.       The history is provided by the patient and medical records. The history is limited by the condition of the patient. No language interpreter was used.    Past Medical History:  Diagnosis Date  . Dementia ([place])   . Glaucoma   . HYPERLIPIDEMIA 11/23/2007  . HYPERTENSION 11/23/2007  . HYPOTHYROIDISM 11/23/2007  . LOW BACK PAIN 11/23/2007  . Macular degeneration   . OSTEOARTHRITIS 11/23/2007  . PERIPHERAL VASCULAR DISEASE 07/11/2008    Patient Active Problem List   Diagnosis Date Noted  . Left wrist injury, initial encounter 06/03/2017  . Distal radius fracture, left 06/03/2017  . Depression 09/02/2016  . Well adult exam 08/31/2014  . Fatigue 07/22/2011  . Syncopal episodes 06/25/2011  . Hyponatremia 06/25/2011  . Alzheimer disease ([place]) 06/25/2011  . DIARRHEA 08/01/2008  . PERIPHERAL VASCULAR DISEASE 07/11/2008  . NEOPLASM OF UNCERTAIN BEHAVIOR OF SKIN 05/23/2008  . OTHER NONTHROMBOCYTOPENIC PURPURAS 05/23/2008  . MYALGIA 05/23/2008  . Hypothyroidism 11/23/2007  . HYPERLIPIDEMIA 11/23/2007  . Essential hypertension 11/23/2007  . OSTEOARTHRITIS 11/23/2007  . LOW BACK  PAIN 11/23/2007    Past Surgical History:  Procedure Laterality Date  . LUMBAR FUSION    . TOTAL KNEE ARTHROPLASTY       OB History   No obstetric history on file.      Home Medications    Prior to Admission medications   Medication Sig Start Date End Date Taking? Authorizing Provider  acetaminophen (TYLENOL) 500 MG tablet Take 500 mg by mouth every 6 (six) hours. Not to exceed 2000 mg per 24 hours    Yes [provider]  acetaminophen (TYLENOL) 500 MG tablet Take 500 mg by mouth every 4 (four) hours as needed for mild pain, fever or headache.   Yes [provider]  alum & mag hydroxide-simeth (MINTOX) 852-778-24 MG/5ML suspension Take 30 mLs by mouth every 6 (six) hours as needed for indigestion or heartburn (Do not exceed 4 doses in 24 hours.).    Yes [provider]  amLODipine (NORVASC) 5 MG tablet Take 1 tablet (5 mg total) by mouth daily. 08/24/17  Yes Plotnikov, Evie Lacks, MD  amoxicillin-clavulanate (AUGMENTIN) 500-125 MG tablet Take 1 tablet by mouth every 12 (twelve) hours.   Yes [provider]  Cholecalciferol (VITAMIN D3) 1000 UNITS CAPS Take 1 capsule by mouth daily. Patient taking differently: Take 1,000 Units by mouth daily.  09/16/11  Yes Plotnikov, Evie Lacks, MD  escitalopram (LEXAPRO) 10 MG tablet Take 10 mg by mouth daily.   Yes [provider]  guaifenesin (ROBITUSSIN) 100 MG/5ML syrup Take  200 mg by mouth every 6 (six) hours as needed for cough. Not to exceed 4 doses in 24 hours   Yes [provider]  loperamide (IMODIUM) 2 MG capsule Take 2 mg by mouth as needed for diarrhea or loose stools (Do not exceed 8 doses in 24 hours.).    Yes [provider]  losartan (COZAAR) 100 MG tablet TAKE 1 TABLET BY MOUTH EVERY DAY Patient taking differently: Take 100 mg by mouth daily.  09/15/17  Yes Plotnikov, Evie Lacks, MD  Loteprednol Etabonate (LOTEMAX) 0.5 % OINT Place 1 strip into both eyes 2 (two) times daily.    Yes [provider]  magnesium hydroxide (MILK OF MAGNESIA) 400 MG/5ML suspension Take 30 mLs by mouth at bedtime as needed for mild constipation.   Yes [provider]  memantine (NAMENDA XR) 14 MG CP24 24 hr capsule Take 14 mg by mouth daily.    Yes [provider]  Menthol, Topical Analgesic, (BIOFREEZE) 4 % GEL Apply 1 application topically 3 (three) times daily as needed (left hip pain).   Yes [provider]  methimazole (TAPAZOLE) 5 MG tablet Take 5 mg by mouth daily.  09/03/18  Yes [provider]  metoprolol succinate (TOPROL-XL) 25 MG 24 hr tablet Take 25 mg by mouth daily. 09/03/18  Yes [provider]  neomycin-bacitracin-polymyxin (NEOSPORIN) ointment Apply 1 application topically as needed for wound care.   Yes [provider]  OVER THE COUNTER MEDICATION Take 1 Can by mouth 4 (four) times daily. House Shake   Yes [provider]  Travoprost, BAK Free, (TRAVATAN Z) 0.004 % SOLN ophthalmic solution Place 2 drops into both eyes daily.    Yes [provider]  traZODone (DESYREL) 50 MG tablet Take 1-2 tablets (50-100 mg total) by mouth at bedtime. Patient taking differently: Take 50 mg by mouth at bedtime.  10/09/17  Yes Plotnikov, Evie Lacks, MD  acetaminophen (TYLENOL) 325 MG tablet Take 2 tablets (650 mg total) by mouth every 6 (six) hours as needed for moderate pain. Patient not taking: Reported on 12/07/2018 06/07/18   Langston Masker B, PA-C  amoxicillin-clavulanate (AUGMENTIN) 875-125 MG tablet Take 1 tablet by mouth every 12 (twelve) hours for 10 days. Patient not taking: Reported on 01/22/2019 01/17/19 01/27/19  Duffy Bruce, MD    Family History Family History  Problem Relation Age of Onset  . Hyperlipidemia Mother   . Diabetes Father   . Hypertension Other     Social History Social History   Tobacco Use  . Smoking status: Never Smoker  . Smokeless tobacco: Never Used  Substance Use Topics  .  Alcohol use: No  . Drug use: No     Allergies   Aricept [donepezil hydrochloride]   Review of Systems Review of Systems  Unable to perform ROS: Dementia  Neurological: Positive for headaches.     Physical Exam Updated Vital Signs BP (!) 204/67 (BP Location: Right Arm)   Pulse 60   Temp (!) 97.5 F (36.4 C) (Oral)   Resp 12   SpO2 99%   Physical Exam Vitals signs and nursing note reviewed.  Constitutional:      General: She is not in acute distress.    Appearance: She is well-developed. She is not diaphoretic.     Comments: Awake, alert, nontoxic appearance  HENT:     Head: Normocephalic.      Mouth/Throat:     Pharynx: No oropharyngeal exudate.  Eyes:  Conjunctiva/sclera:     Right eye: Right conjunctiva is injected.     Left eye: Left conjunctiva is injected.  Neck:     Musculoskeletal: Normal range of motion and neck supple.     Comments: c-collar in place Cardiovascular:     Rate and Rhythm: Regular rhythm. Bradycardia present.     Pulses:          Radial pulses are 2+ on the right side and 2+ on the left side.       Posterior tibial pulses are 2+ on the right side and 2+ on the left side.  Pulmonary:     Effort: Pulmonary effort is normal. No respiratory distress.     Breath sounds: Normal breath sounds. No wheezing.  Chest:     Comments: No ecchymosis, contusion or flail segment. Abdominal:     General: Bowel sounds are normal.     Palpations: Abdomen is soft. There is no mass.     Tenderness: There is no abdominal tenderness. There is no guarding or rebound.     Comments: Soft and nontender, no ecchymosis.  Musculoskeletal: Normal range of motion.     Comments: Moves all major joints without difficulty.  No swelling, erythema or deformity of major joints.  Bilateral knees with evidence of previous surgery.  Skin:    General: Skin is warm and dry.  Neurological:     Mental Status: She is alert.     Comments: Speech is clear patient answers  questions and follows commands.  She is oriented to person alone.  Moves extremities without ataxia      ED Treatments / Results  Labs (all labs ordered are listed, but only abnormal results are displayed) Labs Reviewed  COMPREHENSIVE METABOLIC PANEL - Abnormal; Notable for the following components:      Result Value   Glucose, Bld 124 (*)    GFR calc non Af Amer 56 (*)    All other components within normal limits  URINALYSIS, ROUTINE W REFLEX MICROSCOPIC - Abnormal; Notable for the following components:   Color, Urine STRAW (*)    Protein, ur 30 (*)    All other components within normal limits  CBG MONITORING, ED - Abnormal; Notable for the following components:   Glucose-Capillary 137 (*)    All other components within normal limits  CBC WITH DIFFERENTIAL/PLATELET    EKG EKG Interpretation  Date/Time:  Friday January 21 2019 23:59:49 EST Ventricular Rate:  55 PR Interval:    QRS Duration: 106 QT Interval:  448 QTC Calculation: 429 R Axis:   42 Text Interpretation:  Sinus rhythm No significant change since last tracing Confirmed by Pryor Curia [phone]) on 01/22/2019 12:25:28 AM   Radiology Ct Head Wo Contrast  Result Date: 01/22/2019 CLINICAL DATA:  Multiple falls. EXAM: CT HEAD WITHOUT CONTRAST CT CERVICAL SPINE WITHOUT CONTRAST TECHNIQUE: Multidetector CT imaging of the head and cervical spine was performed following the standard protocol without intravenous contrast. Multiplanar CT image reconstructions of the cervical spine were also generated. COMPARISON:  01/16/2019 head and cervical spine CT. FINDINGS: CT HEAD FINDINGS Brain: No evidence of parenchymal hemorrhage or extra-axial fluid collection. No mass lesion, mass effect, or midline shift. No CT evidence of acute infarction. Nonspecific mild subcortical and periventricular white matter hypodensity, most in keeping with chronic small vessel ischemic change. Cerebral volume is age appropriate. Cerebral ventricle sizes  are stable and concordant with the degree of cerebral volume loss. Vascular: No acute abnormality. Skull: No evidence of  calvarial fracture. I status frontalis interna. Sinuses/Orbits: The visualized paranasal sinuses are essentially clear. Other: New small parietal scalp hematoma. Stable complete right mastoid effusion. Clear left mastoid air cells. CT CERVICAL SPINE FINDINGS Alignment: Straightening of the cervical spine. No facet subluxation. Dens is well positioned between the lateral masses of C1. Stable 2 mm anterolisthesis at C6-7 and 3 mm anterolisthesis at C7-T1. Skull base and vertebrae: No acute fracture. No primary bone lesion or focal pathologic process. Soft tissues and spinal canal: No prevertebral edema. No visible canal hematoma. Disc levels: Moderate to severe multilevel cervical degenerative disc disease, most prominent at C4-5 and C6-7. Advanced bilateral facet arthropathy. Mild degenerative foraminal stenosis on the right at C5-6. Upper chest: No acute abnormality. Other: Complete right mastoid effusion. Clear left mastoid air cells. No discrete thyroid nodules. No pathologically enlarged cervical nodes. IMPRESSION: 1. New small parietal scalp hematoma. 2. No evidence of acute intracranial abnormality. No evidence of calvarial fracture. 3. Stable complete right mastoid effusion. 4. Mild chronic small vessel ischemic changes in the cerebral white matter. 5. No cervical spine fracture or facet subluxation. 6. Moderate to severe multilevel cervical degenerative changes as detailed. Electronically Signed   By: Ilona Sorrel M.D.   On: 01/22/2019 01:18   Ct Cervical Spine Wo Contrast  Result Date: 01/22/2019 CLINICAL DATA:  Multiple falls. EXAM: CT HEAD WITHOUT CONTRAST CT CERVICAL SPINE WITHOUT CONTRAST TECHNIQUE: Multidetector CT imaging of the head and cervical spine was performed following the standard protocol without intravenous contrast. Multiplanar CT image reconstructions of the cervical  spine were also generated. COMPARISON:  01/16/2019 head and cervical spine CT. FINDINGS: CT HEAD FINDINGS Brain: No evidence of parenchymal hemorrhage or extra-axial fluid collection. No mass lesion, mass effect, or midline shift. No CT evidence of acute infarction. Nonspecific mild subcortical and periventricular white matter hypodensity, most in keeping with chronic small vessel ischemic change. Cerebral volume is age appropriate. Cerebral ventricle sizes are stable and concordant with the degree of cerebral volume loss. Vascular: No acute abnormality. Skull: No evidence of calvarial fracture. I status frontalis interna. Sinuses/Orbits: The visualized paranasal sinuses are essentially clear. Other: New small parietal scalp hematoma. Stable complete right mastoid effusion. Clear left mastoid air cells. CT CERVICAL SPINE FINDINGS Alignment: Straightening of the cervical spine. No facet subluxation. Dens is well positioned between the lateral masses of C1. Stable 2 mm anterolisthesis at C6-7 and 3 mm anterolisthesis at C7-T1. Skull base and vertebrae: No acute fracture. No primary bone lesion or focal pathologic process. Soft tissues and spinal canal: No prevertebral edema. No visible canal hematoma. Disc levels: Moderate to severe multilevel cervical degenerative disc disease, most prominent at C4-5 and C6-7. Advanced bilateral facet arthropathy. Mild degenerative foraminal stenosis on the right at C5-6. Upper chest: No acute abnormality. Other: Complete right mastoid effusion. Clear left mastoid air cells. No discrete thyroid nodules. No pathologically enlarged cervical nodes. IMPRESSION: 1. New small parietal scalp hematoma. 2. No evidence of acute intracranial abnormality. No evidence of calvarial fracture. 3. Stable complete right mastoid effusion. 4. Mild chronic small vessel ischemic changes in the cerebral white matter. 5. No cervical spine fracture or facet subluxation. 6. Moderate to severe multilevel  cervical degenerative changes as detailed. Electronically Signed   By: Ilona Sorrel M.D.   On: 01/22/2019 01:18    Procedures Procedures (including critical care time)  Medications Ordered in ED Medications - No data to display   Initial Impression / Assessment and Plan / ED Course  I have  reviewed the triage vital signs and the nursing notes.  Pertinent labs & imaging results that were available during my care of the patient were reviewed by me and considered in my medical decision making (see chart for details).  Clinical Course as of Jan 22 651  Sat Jan 22, 2019  0311 Patient ambulates in the emergency department with steady gait and assistance.   [HM]  3557 She has noted to be hypertensive here in the emergency department however her confusion is no worse than usual.  She does have a history of same.  BP(!): 183/63 [HM]    Clinical Course User Index [HM] Prabhjot Maddux, Jarrett Soho, PA-C    Patient presents after unwitnessed fall.  After discussing mental status with nursing facility, it appears that she is at her baseline mental status.  Labs are reassuring.  No evidence of urinary tract infection.  Patient does have some hypertension.  She has history of the same but is also agitated.  I suspect this is contributing to her hypertension.  No focal neurologic deficits.  CT scan without acute intracranial hemorrhage or skull fracture.  No open wound for which sutures or repair are necessary.  Patient will be discharged back to her facility.  The patient was discussed with and seen by Dr. Leonides Schanz who agrees with the treatment plan.  Final Clinical Impressions(s) / ED Diagnoses   Final diagnoses:  Fall, initial encounter  Contusion of scalp, initial encounter    ED Discharge Orders    None       Ciaran Begay, Gwenlyn Perking 01/22/19 0654    Ward, Delice Bison, DO 01/22/19 [phone]

## 2019-01-22 NOTE — ED Notes (Signed)
Called [facility] 

## 2019-01-22 NOTE — ED Notes (Signed)
Pt discharged from ED; instructions provided; Pt encouraged to return to ED if symptoms worsen and to f/u with PCP; Pt verbalized understanding of all instructions 

## 2019-01-22 NOTE — Discharge Instructions (Addendum)
1. Medications: usual home medications 2. Treatment: rest, drink plenty of fluids,  3. Follow Up: Please followup with your primary doctor in 3-5 days for discussion of your diagnoses and further evaluation after today's visit; if you do not have a primary care doctor use the resource guide provided to find one; Please return to the ER for worsening mental status, seizures, vomiting, fever or other concerns.

## 2019-01-22 NOTE — ED Provider Notes (Signed)
Medical screening examination/treatment/procedure(s) were conducted as a shared visit with non-physician practitioner(s) and myself.  I personally evaluated the patient during the encounter.  EKG Interpretation  Date/Time:  Friday January 21 2019 23:59:49 EST Ventricular Rate:  55 PR Interval:    QRS Duration: 106 QT Interval:  448 QTC Calculation: 429 R Axis:   42 Text Interpretation:  Sinus rhythm No significant change since last tracing Confirmed by Pryor Curia [phone]) on 01/22/2019 12:25:28 AM  Patient is an 85 year old female who had an unwitnessed fall at a nursing home.  Labs, imaging, urine unremarkable.  She is hypertensive but has been extremely agitated here in the ED.  I suspect this is due to change in environment and I feel she will improve when discharged back to her nursing facility.   Con Arganbright, Delice Bison, DO 01/22/19 [phone]

## 2019-02-07 ENCOUNTER — Telehealth: Payer: Self-pay | Admitting: *Deleted

## 2019-02-07 NOTE — Telephone Encounter (Signed)
Rec'd msg from patient ping stating on 02/07/19 @ 12:00 AM pt has been admitted to [facility] (HOSPICE). Per chart pt has not seen provider since 2018. She was in a nursing home and was seeing their providers...lmb

## 2019-03-31 ENCOUNTER — Emergency Department (HOSPITAL_COMMUNITY)
Admission: EM | Admit: 2019-03-31 | Discharge: 2019-03-31 | Disposition: A | Discharge status: Home / Self Care | Attending: Emergency Medicine | Admitting: Emergency Medicine

## 2019-03-31 ENCOUNTER — Emergency Department (HOSPITAL_COMMUNITY)

## 2019-03-31 ENCOUNTER — Encounter (HOSPITAL_COMMUNITY): Payer: Self-pay

## 2019-03-31 ENCOUNTER — Other Ambulatory Visit: Payer: Self-pay

## 2019-03-31 DIAGNOSIS — Z79899 Other long term (current) drug therapy: Secondary | ICD-10-CM | POA: Diagnosis not present

## 2019-03-31 DIAGNOSIS — Y939 Activity, unspecified: Secondary | ICD-10-CM | POA: Insufficient documentation

## 2019-03-31 DIAGNOSIS — Y92129 Unspecified place in nursing home as the place of occurrence of the external cause: Secondary | ICD-10-CM | POA: Insufficient documentation

## 2019-03-31 DIAGNOSIS — N3001 Acute cystitis with hematuria: Secondary | ICD-10-CM | POA: Diagnosis not present

## 2019-03-31 DIAGNOSIS — F0390 Unspecified dementia without behavioral disturbance: Secondary | ICD-10-CM | POA: Insufficient documentation

## 2019-03-31 DIAGNOSIS — I10 Essential (primary) hypertension: Secondary | ICD-10-CM | POA: Diagnosis not present

## 2019-03-31 DIAGNOSIS — S0990XA Unspecified injury of head, initial encounter: Secondary | ICD-10-CM | POA: Diagnosis present

## 2019-03-31 DIAGNOSIS — W01190A Fall on same level from slipping, tripping and stumbling with subsequent striking against furniture, initial encounter: Secondary | ICD-10-CM | POA: Diagnosis not present

## 2019-03-31 DIAGNOSIS — S0101XA Laceration without foreign body of scalp, initial encounter: Secondary | ICD-10-CM

## 2019-03-31 DIAGNOSIS — E039 Hypothyroidism, unspecified: Secondary | ICD-10-CM | POA: Diagnosis not present

## 2019-03-31 DIAGNOSIS — Y999 Unspecified external cause status: Secondary | ICD-10-CM | POA: Insufficient documentation

## 2019-03-31 DIAGNOSIS — W19XXXA Unspecified fall, initial encounter: Secondary | ICD-10-CM

## 2019-03-31 LAB — URINALYSIS, ROUTINE W REFLEX MICROSCOPIC
Bilirubin Urine: NEGATIVE
Glucose, UA: NEGATIVE mg/dL
Hgb urine dipstick: NEGATIVE
Ketones, ur: NEGATIVE mg/dL
Nitrite: POSITIVE — AB
Protein, ur: 30 mg/dL — AB
Specific Gravity, Urine: 1.013 (ref 1.005–1.030)
pH: 6 (ref 5.0–8.0)

## 2019-03-31 LAB — CBC WITH DIFFERENTIAL/PLATELET
Abs Immature Granulocytes: 0.02 10*3/uL (ref 0.00–0.07)
Basophils Absolute: 0 10*3/uL (ref 0.0–0.1)
Basophils Relative: 0 %
Eosinophils Absolute: 0.2 10*3/uL (ref 0.0–0.5)
Eosinophils Relative: 3 %
HCT: 37 % (ref 36.0–46.0)
Hemoglobin: 11.8 g/dL — ABNORMAL LOW (ref 12.0–15.0)
Immature Granulocytes: 0 %
Lymphocytes Relative: 24 %
Lymphs Abs: 1.7 10*3/uL (ref 0.7–4.0)
MCH: 29.8 pg (ref 26.0–34.0)
MCHC: 31.9 g/dL (ref 30.0–36.0)
MCV: 93.4 fL (ref 80.0–100.0)
Monocytes Absolute: 0.9 10*3/uL (ref 0.1–1.0)
Monocytes Relative: 13 %
Neutro Abs: 4.3 10*3/uL (ref 1.7–7.7)
Neutrophils Relative %: 60 %
Platelets: 277 10*3/uL (ref 150–400)
RBC: 3.96 MIL/uL (ref 3.87–5.11)
RDW: 13.2 % (ref 11.5–15.5)
WBC: 7.2 10*3/uL (ref 4.0–10.5)
nRBC: 0 % (ref 0.0–0.2)

## 2019-03-31 LAB — BASIC METABOLIC PANEL
Anion gap: 11 (ref 5–15)
BUN: 32 mg/dL — ABNORMAL HIGH (ref 8–23)
CO2: 23 mmol/L (ref 22–32)
Calcium: 9.7 mg/dL (ref 8.9–10.3)
Chloride: 104 mmol/L (ref 98–111)
Creatinine, Ser: 0.81 mg/dL (ref 0.44–1.00)
GFR calc Af Amer: >60 mL/min (ref >60)
GFR calc non Af Amer: >60 mL/min (ref >60)
Glucose, Bld: 93 mg/dL (ref 70–99)
Potassium: 3.8 mmol/L (ref 3.5–5.1)
Sodium: 138 mmol/L (ref 135–145)

## 2019-03-31 LAB — TROPONIN I: Troponin I: <0.03 ng/mL (ref <0.03)

## 2019-03-31 LAB — LACTIC ACID, PLASMA: Lactic Acid, Venous: 0.7 mmol/L (ref 0.5–1.9)

## 2019-03-31 MED ORDER — FLUORESCEIN SODIUM 1 MG OP STRP
1.0000 | ORAL_STRIP | Freq: Once | OPHTHALMIC | Status: AC
  Administered 2019-03-31: 1 via OPHTHALMIC
  Filled 2019-03-31: qty 1

## 2019-03-31 MED ORDER — LIDOCAINE-EPINEPHRINE (PF) 2 %-1:200000 IJ SOLN
20.0000 mL | Freq: Once | INTRAMUSCULAR | Status: AC
  Administered 2019-03-31: 20 mL
  Filled 2019-03-31: qty 20

## 2019-03-31 MED ORDER — MOXIFLOXACIN HCL 0.5 % OP SOLN: 1.0000 [drp] | Freq: Four times a day (QID) | OPHTHALMIC | 0 refills | Status: AC

## 2019-03-31 MED ORDER — ARTIFICIAL TEARS OPHTHALMIC OINT: TOPICAL_OINTMENT | OPHTHALMIC | 0 refills | Status: AC | PRN

## 2019-03-31 MED ORDER — TETRACAINE HCL 0.5 % OP SOLN
2.0000 [drp] | Freq: Once | OPHTHALMIC | Status: AC
  Administered 2019-03-31: 2 [drp] via OPHTHALMIC
  Filled 2019-03-31: qty 4

## 2019-03-31 MED ORDER — LORAZEPAM 2 MG/ML IJ SOLN
INTRAMUSCULAR | Status: AC
  Filled 2019-03-31: qty 1

## 2019-03-31 MED ORDER — CEPHALEXIN 500 MG PO CAPS: 500.0000 mg | ORAL_CAPSULE | Freq: Two times a day (BID) | ORAL | 0 refills | Status: AC

## 2019-03-31 MED ORDER — TETANUS-DIPHTH-ACELL PERTUSSIS 5-2.5-18.5 LF-MCG/0.5 IM SUSP
0.5000 mL | Freq: Once | INTRAMUSCULAR | Status: AC
  Administered 2019-03-31: 0.5 mL via INTRAMUSCULAR
  Filled 2019-03-31: qty 0.5

## 2019-03-31 MED ORDER — LORAZEPAM 2 MG/ML IJ SOLN
0.5000 mg | Freq: Once | INTRAMUSCULAR | Status: AC
  Administered 2019-03-31: 0.5 mg via INTRAVENOUS

## 2019-03-31 MED ORDER — ERYTHROMYCIN 5 MG/GM OP OINT: TOPICAL_OINTMENT | OPHTHALMIC | 0 refills | Status: AC

## 2019-03-31 NOTE — ED Triage Notes (Signed)
Pt arrived via [facility]; pt from [facility] with unwitnessed fall; pt A/Ox1; pt has approx 4cm laceration to L head; no blood thinners noted; 150/100; 66; 97% on RA; 132 CBG

## 2019-03-31 NOTE — ED Provider Notes (Signed)
[facility] EMERGENCY DEPARTMENT Provider Note   CSN: [id number] Arrival date & time: 03/31/19  0001    History   Chief Complaint No chief complaint on file.   HPI Lacie Landry is a 85 y.o. female.     Level 5 caveat for dementia.  Patient from nursing facility after unwitnessed fall.  She apparently hit her head on a bedside table.  There is a laceration with unknown loss of consciousness.  He was found in the sitting position by EMS.  She appears to be at her neurological baseline.  She does not take any blood thinners.  She complains of pain to her head only.  She is oriented to person and place.  EMS also concerned with redness and drainage from her left eye of unknown acuity.  Facility reported concern for possible UTI as well.  No documented fevers.  No known vomiting or diarrhea.  Patient unable to give any history.  The history is provided by the patient and the EMS personnel.    Past Medical History:  Diagnosis Date   Dementia ([place])    Glaucoma    HYPERLIPIDEMIA 11/23/2007   HYPERTENSION 11/23/2007   HYPOTHYROIDISM 11/23/2007   LOW BACK PAIN 11/23/2007   Macular degeneration    OSTEOARTHRITIS 11/23/2007   PERIPHERAL VASCULAR DISEASE 07/11/2008    Patient Active Problem List   Diagnosis Date Noted   Left wrist injury, initial encounter 06/03/2017   Distal radius fracture, left 06/03/2017   Depression 09/02/2016   Well adult exam 08/31/2014   Fatigue 07/22/2011   Syncopal episodes 06/25/2011   Hyponatremia 06/25/2011   Alzheimer disease ([place]) 06/25/2011   DIARRHEA 08/01/2008   PERIPHERAL VASCULAR DISEASE 07/11/2008   NEOPLASM OF UNCERTAIN BEHAVIOR OF SKIN 05/23/2008   OTHER NONTHROMBOCYTOPENIC PURPURAS 05/23/2008   MYALGIA 05/23/2008   Hypothyroidism 11/23/2007   HYPERLIPIDEMIA 11/23/2007   Essential hypertension 11/23/2007   OSTEOARTHRITIS 11/23/2007   LOW BACK PAIN 11/23/2007    Past Surgical History:    Procedure Laterality Date   LUMBAR FUSION     TOTAL KNEE ARTHROPLASTY       OB History   No obstetric history on file.      Home Medications    Prior to Admission medications   Medication Sig Start Date End Date Taking? Authorizing Provider  acetaminophen (TYLENOL) 325 MG tablet Take 2 tablets (650 mg total) by mouth every 6 (six) hours as needed for moderate pain. Patient not taking: Reported on 12/07/2018 06/07/18   Langston Masker B, PA-C  acetaminophen (TYLENOL) 500 MG tablet Take 500 mg by mouth every 6 (six) hours. Not to exceed 2000 mg per 24 hours     [provider]  acetaminophen (TYLENOL) 500 MG tablet Take 500 mg by mouth every 4 (four) hours as needed for mild pain, fever or headache.    [provider]  alum & mag hydroxide-simeth ([place]) 200-200-20 MG/5ML suspension Take 30 mLs by mouth every 6 (six) hours as needed for indigestion or heartburn (Do not exceed 4 doses in 24 hours.).     [provider]  amLODipine (NORVASC) 5 MG tablet Take 1 tablet (5 mg total) by mouth daily. 08/24/17   Plotnikov, Evie Lacks, MD  amoxicillin-clavulanate (AUGMENTIN) 500-125 MG tablet Take 1 tablet by mouth every 12 (twelve) hours.    [provider]  Cholecalciferol (VITAMIN D3) 1000 UNITS CAPS Take 1 capsule by mouth daily. Patient taking differently: Take 1,000 Units by mouth daily.  09/16/11  Plotnikov, Evie Lacks, MD  escitalopram (LEXAPRO) 10 MG tablet Take 10 mg by mouth daily.    [provider]  guaifenesin (ROBITUSSIN) 100 MG/5ML syrup Take 200 mg by mouth every 6 (six) hours as needed for cough. Not to exceed 4 doses in 24 hours    [provider]  loperamide (IMODIUM) 2 MG capsule Take 2 mg by mouth as needed for diarrhea or loose stools (Do not exceed 8 doses in 24 hours.).     [provider]  losartan (COZAAR) 100 MG tablet TAKE 1 TABLET BY MOUTH EVERY DAY Patient taking differently: Take 100 mg by mouth daily.   09/15/17   Plotnikov, Evie Lacks, MD  Loteprednol Etabonate (LOTEMAX) 0.5 % OINT Place 1 strip into both eyes 2 (two) times daily.    [provider]  magnesium hydroxide (MILK OF MAGNESIA) 400 MG/5ML suspension Take 30 mLs by mouth at bedtime as needed for mild constipation.    [provider]  memantine (NAMENDA XR) 14 MG CP24 24 hr capsule Take 14 mg by mouth daily.     [provider]  Menthol, Topical Analgesic, (BIOFREEZE) 4 % GEL Apply 1 application topically 3 (three) times daily as needed (left hip pain).    [provider]  methimazole (TAPAZOLE) 5 MG tablet Take 5 mg by mouth daily.  09/03/18   [provider]  metoprolol succinate (TOPROL-XL) 25 MG 24 hr tablet Take 25 mg by mouth daily. 09/03/18   [provider]  neomycin-bacitracin-polymyxin (NEOSPORIN) ointment Apply 1 application topically as needed for wound care.    [provider]  OVER THE COUNTER MEDICATION Take 1 Can by mouth 4 (four) times daily. House Shake    [provider]  Travoprost, BAK Free, (TRAVATAN Z) 0.004 % SOLN ophthalmic solution Place 2 drops into both eyes daily.     [provider]  traZODone (DESYREL) 50 MG tablet Take 1-2 tablets (50-100 mg total) by mouth at bedtime. Patient taking differently: Take 50 mg by mouth at bedtime.  10/09/17   Plotnikov, Evie Lacks, MD    Family History Family History  Problem Relation Age of Onset   Hyperlipidemia Mother    Diabetes Father    Hypertension Other     Social History Social History   Tobacco Use   Smoking status: Never Smoker   Smokeless tobacco: Never Used  Substance Use Topics   Alcohol use: No   Drug use: No     Allergies   Aricept [donepezil hydrochloride]   Review of Systems Review of Systems  Unable to perform ROS: Dementia     Physical Exam Updated Vital Signs BP (!) 173/82 (BP Location: Left Arm)    Pulse 91    Temp (!) 97 F (36.1 C) (Rectal)     Resp 20    SpO2 100%   Physical Exam Vitals signs and nursing note reviewed.  Constitutional:      General: She is not in acute distress.    Appearance: She is well-developed.     Comments: Patient oriented to person  HENT:     Head: Normocephalic and atraumatic.     Comments: 4 cm U-shaped laceration to left parietal scalp    Mouth/Throat:     Pharynx: No oropharyngeal exudate.  Eyes:     Conjunctiva/sclera: Conjunctivae normal.     Pupils: Pupils are equal, round, and reactive to light.     Comments: Left eye has conjunctival irritation, opacified irregular cornea  with purulent discharge  Smaller amount of conjunctival irritation and drainage of the right    Neck:     Musculoskeletal: Normal range of motion and neck supple.     Comments: NO C spine tenderness Cardiovascular:     Rate and Rhythm: Normal rate and regular rhythm.     Heart sounds: Normal heart sounds. No murmur.  Pulmonary:     Effort: Pulmonary effort is normal. No respiratory distress.     Breath sounds: Normal breath sounds.  Abdominal:     Palpations: Abdomen is soft.     Tenderness: There is no abdominal tenderness. There is no guarding or rebound.  Musculoskeletal: Normal range of motion.        General: No tenderness.     Comments: Full range of motion of hips without pain  Skin:    General: Skin is warm.  Neurological:     Mental Status: She is alert.     Cranial Nerves: No cranial nerve deficit.     Motor: No abnormal muscle tone.     Coordination: Coordination normal.     Comments: 5/5 strength throughout, able to move all extremities and follows some commands.  She is oriented to person.  Psychiatric:        Behavior: Behavior normal.    OS    OD   ED Treatments / Results  Labs (all labs ordered are listed, but only abnormal results are displayed) Labs Reviewed  CBC WITH DIFFERENTIAL/PLATELET - Abnormal; Notable for the following components:      Result Value   Hemoglobin 11.8  (*)    All other components within normal limits  BASIC METABOLIC PANEL - Abnormal; Notable for the following components:   BUN 32 (*)    All other components within normal limits  URINALYSIS, ROUTINE W REFLEX MICROSCOPIC - Abnormal; Notable for the following components:   Protein, ur 30 (*)    Nitrite POSITIVE (*)    Leukocytes,Ua TRACE (*)    Bacteria, UA RARE (*)    All other components within normal limits  CULTURE, BLOOD (ROUTINE X 2)  CULTURE, BLOOD (ROUTINE X 2)  URINE CULTURE  TROPONIN I  LACTIC ACID, PLASMA  LACTIC ACID, PLASMA    EKG EKG Interpretation  Date/Time:  Thursday March 31 2019 00:31:15 EDT Ventricular Rate:  76 PR Interval:    QRS Duration: 98 QT Interval:  401 QTC Calculation: 451 R Axis:   57 Text Interpretation:  Sinus rhythm No significant change was found Confirmed by Ezequiel Essex [phone]) on 03/31/2019 3:23:09 AM   Radiology Ct Head Wo Contrast  Result Date: 03/31/2019 CLINICAL DATA:  85 y/o F; unwitnessed fall. 4 cm laceration to the left head. EXAM: CT HEAD WITHOUT CONTRAST CT MAXILLOFACIAL WITHOUT CONTRAST CT CERVICAL SPINE WITHOUT CONTRAST TECHNIQUE: Multidetector CT imaging of the head, cervical spine, and maxillofacial structures were performed using the standard protocol without intravenous contrast. Multiplanar CT image reconstructions of the cervical spine and maxillofacial structures were also generated. COMPARISON:  01/22/2019 CT head and CT cervical spine. 02/07/2018 CT maxillofacial. FINDINGS: CT HEAD FINDINGS Brain: No evidence of acute infarction, hemorrhage, hydrocephalus, extra-axial collection or mass lesion/mass effect. Stable confluent white matter hypodensities compatible with moderate chronic microvascular ischemic changes stable moderate volume loss of the brain for age. Vascular: Calcific atherosclerosis of carotid siphons. No hyperdense vessel. Skull: Left temporoparietal scalp contusion and laceration. No calvarial fracture.  Other: None. CT MAXILLOFACIAL FINDINGS Osseous: No fracture or mandibular dislocation. No destructive process.  Large torus maxillary it is. Orbits: Negative. No traumatic or inflammatory finding. Bilateral intra-ocular lens replacement. Sinuses: Mild mucosal thickening of the maxillary sinuses. Additional included paranasal sinuses and the mastoid air cells are normally aerated. Debris within the external auditory canals, likely cerumen. Soft tissues: Negative. CT CERVICAL SPINE FINDINGS Alignment: Straightening of cervical lordosis. Stable C4-5, C6-7, and C7-T1 grade 1 anterolisthesis. Skull base and vertebrae: No acute fracture. No primary bone lesion or focal pathologic process. Soft tissues and spinal canal: No prevertebral fluid or swelling. No visible canal hematoma. Disc levels: Moderate spondylosis of the cervical spine with multilevel disc space narrowing greatest at C4-5 and C6-7. Right greater than left facet hypertrophy. C4-5 facet fusion, likely on degenerative basis. No high-grade bony spinal canal stenosis. Uncovertebral and facet hypertrophy encroach on the neural foramen the left C3-4 level, right C4-5 level, bilateral C5-6 level, bilateral C6-7 level. Upper chest: Negative. Other: Calcific atherosclerosis of carotid bifurcations. IMPRESSION: CT head: 1. Left temporoparietal scalp contusion and laceration. No calvarial fracture. 2. No acute intracranial abnormality. 3. Stable moderate chronic microvascular ischemic changes and volume loss of the brain for age. CT maxillofacial: 1. No acute facial fracture or mandibular dislocation. 2. Mild maxillary sinus disease. CT cervical spine: 1. No acute fracture or dislocation. 2. Stable moderate cervical spondylosis. No high-grade bony spinal canal stenosis. Electronically Signed   By: Kristine Garbe M.D.   On: 03/31/2019 01:30   Ct Cervical Spine Wo Contrast  Result Date: 03/31/2019 CLINICAL DATA:  85 y/o F; unwitnessed fall. 4 cm laceration  to the left head. EXAM: CT HEAD WITHOUT CONTRAST CT MAXILLOFACIAL WITHOUT CONTRAST CT CERVICAL SPINE WITHOUT CONTRAST TECHNIQUE: Multidetector CT imaging of the head, cervical spine, and maxillofacial structures were performed using the standard protocol without intravenous contrast. Multiplanar CT image reconstructions of the cervical spine and maxillofacial structures were also generated. COMPARISON:  01/22/2019 CT head and CT cervical spine. 02/07/2018 CT maxillofacial. FINDINGS: CT HEAD FINDINGS Brain: No evidence of acute infarction, hemorrhage, hydrocephalus, extra-axial collection or mass lesion/mass effect. Stable confluent white matter hypodensities compatible with moderate chronic microvascular ischemic changes stable moderate volume loss of the brain for age. Vascular: Calcific atherosclerosis of carotid siphons. No hyperdense vessel. Skull: Left temporoparietal scalp contusion and laceration. No calvarial fracture. Other: None. CT MAXILLOFACIAL FINDINGS Osseous: No fracture or mandibular dislocation. No destructive process. Large torus maxillary it is. Orbits: Negative. No traumatic or inflammatory finding. Bilateral intra-ocular lens replacement. Sinuses: Mild mucosal thickening of the maxillary sinuses. Additional included paranasal sinuses and the mastoid air cells are normally aerated. Debris within the external auditory canals, likely cerumen. Soft tissues: Negative. CT CERVICAL SPINE FINDINGS Alignment: Straightening of cervical lordosis. Stable C4-5, C6-7, and C7-T1 grade 1 anterolisthesis. Skull base and vertebrae: No acute fracture. No primary bone lesion or focal pathologic process. Soft tissues and spinal canal: No prevertebral fluid or swelling. No visible canal hematoma. Disc levels: Moderate spondylosis of the cervical spine with multilevel disc space narrowing greatest at C4-5 and C6-7. Right greater than left facet hypertrophy. C4-5 facet fusion, likely on degenerative basis. No  high-grade bony spinal canal stenosis. Uncovertebral and facet hypertrophy encroach on the neural foramen the left C3-4 level, right C4-5 level, bilateral C5-6 level, bilateral C6-7 level. Upper chest: Negative. Other: Calcific atherosclerosis of carotid bifurcations. IMPRESSION: CT head: 1. Left temporoparietal scalp contusion and laceration. No calvarial fracture. 2. No acute intracranial abnormality. 3. Stable moderate chronic microvascular ischemic changes and volume loss of the brain for age. CT maxillofacial:  1. No acute facial fracture or mandibular dislocation. 2. Mild maxillary sinus disease. CT cervical spine: 1. No acute fracture or dislocation. 2. Stable moderate cervical spondylosis. No high-grade bony spinal canal stenosis. Electronically Signed   By: Kristine Garbe M.D.   On: 03/31/2019 01:30   Dg Pelvis Portable  Result Date: 03/31/2019 CLINICAL DATA:  85 y/o F; unwitnessed fall. Laceration to head. Left hip pain. EXAM: PORTABLE PELVIS 1-2 VIEWS COMPARISON:  07/03/2018 pelvis radiographs. FINDINGS: No acute fracture or dislocation identified. Transitional lumbosacral anatomy. Bilateral total hip arthroplasty are stable in position. No periprosthetic lucency or fracture identified within the included portions of the prostheses. No appreciable pelvic fracture or diastasis. IMPRESSION: Negative. Electronically Signed   By: Kristine Garbe M.D.   On: 03/31/2019 00:30   Dg Chest Portable 1 View  Result Date: 03/31/2019 CLINICAL DATA:  85 y/o  F; unwitnessed fall.  Head laceration. EXAM: PORTABLE CHEST 1 VIEW COMPARISON:  01/16/2019 chest radiograph FINDINGS: Stable mildly enlarged cardiac silhouette given projection and technique. Aortic atherosclerosis with calcification. Clear lungs. No pleural effusion or pneumothorax. No acute osseous abnormality is evident. IMPRESSION: No acute process identified. Electronically Signed   By: Kristine Garbe M.D.   On: 03/31/2019  00:31   Ct Maxillofacial Wo Contrast  Result Date: 03/31/2019 CLINICAL DATA:  85 y/o F; unwitnessed fall. 4 cm laceration to the left head. EXAM: CT HEAD WITHOUT CONTRAST CT MAXILLOFACIAL WITHOUT CONTRAST CT CERVICAL SPINE WITHOUT CONTRAST TECHNIQUE: Multidetector CT imaging of the head, cervical spine, and maxillofacial structures were performed using the standard protocol without intravenous contrast. Multiplanar CT image reconstructions of the cervical spine and maxillofacial structures were also generated. COMPARISON:  01/22/2019 CT head and CT cervical spine. 02/07/2018 CT maxillofacial. FINDINGS: CT HEAD FINDINGS Brain: No evidence of acute infarction, hemorrhage, hydrocephalus, extra-axial collection or mass lesion/mass effect. Stable confluent white matter hypodensities compatible with moderate chronic microvascular ischemic changes stable moderate volume loss of the brain for age. Vascular: Calcific atherosclerosis of carotid siphons. No hyperdense vessel. Skull: Left temporoparietal scalp contusion and laceration. No calvarial fracture. Other: None. CT MAXILLOFACIAL FINDINGS Osseous: No fracture or mandibular dislocation. No destructive process. Large torus maxillary it is. Orbits: Negative. No traumatic or inflammatory finding. Bilateral intra-ocular lens replacement. Sinuses: Mild mucosal thickening of the maxillary sinuses. Additional included paranasal sinuses and the mastoid air cells are normally aerated. Debris within the external auditory canals, likely cerumen. Soft tissues: Negative. CT CERVICAL SPINE FINDINGS Alignment: Straightening of cervical lordosis. Stable C4-5, C6-7, and C7-T1 grade 1 anterolisthesis. Skull base and vertebrae: No acute fracture. No primary bone lesion or focal pathologic process. Soft tissues and spinal canal: No prevertebral fluid or swelling. No visible canal hematoma. Disc levels: Moderate spondylosis of the cervical spine with multilevel disc space narrowing  greatest at C4-5 and C6-7. Right greater than left facet hypertrophy. C4-5 facet fusion, likely on degenerative basis. No high-grade bony spinal canal stenosis. Uncovertebral and facet hypertrophy encroach on the neural foramen the left C3-4 level, right C4-5 level, bilateral C5-6 level, bilateral C6-7 level. Upper chest: Negative. Other: Calcific atherosclerosis of carotid bifurcations. IMPRESSION: CT head: 1. Left temporoparietal scalp contusion and laceration. No calvarial fracture. 2. No acute intracranial abnormality. 3. Stable moderate chronic microvascular ischemic changes and volume loss of the brain for age. CT maxillofacial: 1. No acute facial fracture or mandibular dislocation. 2. Mild maxillary sinus disease. CT cervical spine: 1. No acute fracture or dislocation. 2. Stable moderate cervical spondylosis. No high-grade bony spinal canal  stenosis. Electronically Signed   By: Kristine Garbe M.D.   On: 03/31/2019 01:30    Procedures .Marland KitchenLaceration Repair Date/Time: 03/31/2019 2:22 AM Performed by: Ezequiel Essex, MD Authorized by: Ezequiel Essex, MD   Consent:    Consent obtained:  Verbal and emergent situation   Consent given by:  Patient   Risks discussed:  Infection, pain, poor wound healing, need for additional repair, nerve damage, retained foreign body, tendon damage, vascular damage and poor cosmetic result   Alternatives discussed:  Delayed treatment Anesthesia (see MAR for exact dosages):    Anesthesia method:  Local infiltration   Local anesthetic:  Lidocaine 1% WITH epi Laceration details:    Location:  Scalp   Scalp location:  L parietal   Length (cm):  5 Repair type:    Repair type:  Simple Pre-procedure details:    Preparation:  Patient was prepped and draped in usual sterile fashion and imaging obtained to evaluate for foreign bodies Exploration:    Hemostasis achieved with:  Epinephrine and direct pressure   Wound exploration: wound explored through full  range of motion and entire depth of wound probed and visualized     Contaminated: no   Treatment:    Area cleansed with:  Betadine   Amount of cleaning:  Standard   Irrigation solution:  Sterile saline   Irrigation method:  Syringe   Visualized foreign bodies/material removed: no   Skin repair:    Repair method:  Staples   Number of staples:  7 Approximation:    Approximation:  Close Post-procedure details:    Dressing:  Antibiotic ointment   Patient tolerance of procedure:  Tolerated well, no immediate complications   (including critical care time)  Medications Ordered in ED Medications  lidocaine-EPINEPHrine (XYLOCAINE W/EPI) 2 %-1:200000 (PF) injection 20 mL (has no administration in time range)  Tdap (BOOSTRIX) injection 0.5 mL (has no administration in time range)     Initial Impression / Assessment and Plan / ED Course  I have reviewed the triage vital signs and the nursing notes.  Pertinent labs & imaging results that were available during my care of the patient were reviewed by me and considered in my medical decision making (see chart for details).       Unwitnessed fall with scalp laceration.  Had neurological baseline per EMS and nursing facility.  No blood thinner use.  CT scans are reassuring without intracranial injury or fracture.  Tetanus updated. Traumatic imaging is negative.  Laceration repaired as above.  Tetanus updated. Labs are at baseline. UTI will be treated.   D/w [facility].  Fall was unwitnessed.  Patient is at her baseline.  They state patient is not in hospice but is DNR.  They report patient's left eye appears the same as always with redness and drainage.  They report patient is mostly blind.  No answer at patient's emergency contact Juanita Craver [phone]  Patient's eye exam findings were discussed with ophthalmology Dr. Coralyn Pear who reviewed the images.  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  He does  not see any evidence of endophthalmitis.  He recommends fluoroquinolone drops 4 times a day as well as erythromycin ointment 4 times a day and artificial tears every hour.  He states patient can follow-up with him or with her ophthalmologist at [facility].  Patient able to ambulate with assistance which is apparently her baseline. Patient will follow up with her PCP for staple removal in 1 week. Ophthalmology  follow-up given as well. Return precautions discussed. Final Clinical Impressions(s) / ED Diagnoses   Final diagnoses:  Fall, initial encounter  Laceration of scalp, initial encounter  Acute cystitis with hematuria    ED Discharge Orders    None       Kimberlea Schlag, Annie Main, MD 03/31/19 [phone]

## 2019-03-31 NOTE — ED Notes (Signed)
Pt discharged from ED; instructions provided and scripts given; Pt encouraged to return to ED if symptoms worsen and to f/u with PCP; Pt verbalized understanding of all instructions 

## 2019-03-31 NOTE — Discharge Instructions (Addendum)
Follow-up with your primary doctor for staple removal in 1 week.  Take the antibiotic as prescribed for urinary tract infection.  You should also see an eye doctor regarding your chronic eye irritation and use the antibiotic ointment as prescribed.  Use the eyedrops as well as eye ointment 4 times daily and follow-up with the ophthalmologist for recheck next week.  Return to the ED if you develop new or worsening symptoms.

## 2019-04-01 LAB — URINE CULTURE: Culture: >=100000 — AB

## 2019-04-02 ENCOUNTER — Telehealth: Payer: Self-pay | Admitting: Emergency Medicine

## 2019-04-02 NOTE — Telephone Encounter (Signed)
Post ED Visit - Positive Culture Follow-up  Culture report reviewed by antimicrobial stewardship pharmacist: [facility] Team []  Elenor Quinones, Pharm.D. []  Heide Guile, Pharm.D., BCPS AQ-ID []  Parks Neptune, Pharm.D., BCPS []  Alycia Rossetti, Pharm.D., BCPS []  [place], Pharm.D., BCPS, AAHIVP []  Legrand Como, Pharm.D., BCPS, AAHIVP [x]  Salome Arnt, PharmD, BCPS []  Johnnette Gourd, PharmD, BCPS []  Hughes Better, PharmD, BCPS []  Leeroy Cha, PharmD []  Laqueta Linden, PharmD, BCPS []  Albertina Parr, PharmD  [facility] Team []  Leodis Sias, PharmD []  Lindell Spar, PharmD []  Royetta Asal, PharmD []  Graylin Shiver, Rph []  Rema Fendt) Glennon Mac, PharmD []  Arlyn Dunning, PharmD []  Netta Cedars, PharmD []  Dia Sitter, PharmD []  Leone Haven, PharmD []  Gretta Arab, PharmD []  Theodis Shove, PharmD []  Peggyann Juba, PharmD []  Reuel Boom, PharmD   Positive urine culture Treated with cephalexin, organism sensitive to the same and no further patient follow-up is required at this time.  Larene Beach [place] 04/02/2019, 2:33 PM

## 2019-04-05 LAB — CULTURE, BLOOD (ROUTINE X 2)
Culture: NO GROWTH
Culture: NO GROWTH
Special Requests: ADEQUATE

## 2019-09-13 ENCOUNTER — Emergency Department (HOSPITAL_COMMUNITY): Payer: Medicare Other

## 2019-09-13 ENCOUNTER — Other Ambulatory Visit: Payer: Self-pay

## 2019-09-13 ENCOUNTER — Inpatient Hospital Stay (HOSPITAL_COMMUNITY)
Admission: EM | Admit: 2019-09-13 | Discharge: 2019-09-21 | DRG: 481 | Disposition: A | Discharge status: Skilled Nursing Facility | Payer: Medicare Other | Attending: Internal Medicine | Admitting: Internal Medicine

## 2019-09-13 DIAGNOSIS — Z20828 Contact with and (suspected) exposure to other viral communicable diseases: Secondary | ICD-10-CM | POA: Diagnosis not present

## 2019-09-13 DIAGNOSIS — R410 Disorientation, unspecified: Secondary | ICD-10-CM | POA: Diagnosis not present

## 2019-09-13 DIAGNOSIS — E039 Hypothyroidism, unspecified: Secondary | ICD-10-CM | POA: Diagnosis present

## 2019-09-13 DIAGNOSIS — W19XXXA Unspecified fall, initial encounter: Secondary | ICD-10-CM | POA: Diagnosis not present

## 2019-09-13 DIAGNOSIS — Z5320 Procedure and treatment not carried out because of patient's decision for unspecified reasons: Secondary | ICD-10-CM | POA: Diagnosis not present

## 2019-09-13 DIAGNOSIS — I739 Peripheral vascular disease, unspecified: Secondary | ICD-10-CM | POA: Diagnosis present

## 2019-09-13 DIAGNOSIS — Z8349 Family history of other endocrine, nutritional and metabolic diseases: Secondary | ICD-10-CM

## 2019-09-13 DIAGNOSIS — G8929 Other chronic pain: Secondary | ICD-10-CM | POA: Diagnosis present

## 2019-09-13 DIAGNOSIS — Z8249 Family history of ischemic heart disease and other diseases of the circulatory system: Secondary | ICD-10-CM

## 2019-09-13 DIAGNOSIS — F05 Delirium due to known physiological condition: Secondary | ICD-10-CM | POA: Diagnosis not present

## 2019-09-13 DIAGNOSIS — I10 Essential (primary) hypertension: Secondary | ICD-10-CM | POA: Diagnosis not present

## 2019-09-13 DIAGNOSIS — Z66 Do not resuscitate: Secondary | ICD-10-CM | POA: Diagnosis not present

## 2019-09-13 DIAGNOSIS — H409 Unspecified glaucoma: Secondary | ICD-10-CM | POA: Diagnosis present

## 2019-09-13 DIAGNOSIS — F32A Depression, unspecified: Secondary | ICD-10-CM | POA: Diagnosis present

## 2019-09-13 DIAGNOSIS — Z515 Encounter for palliative care: Secondary | ICD-10-CM | POA: Diagnosis present

## 2019-09-13 DIAGNOSIS — R627 Adult failure to thrive: Secondary | ICD-10-CM | POA: Diagnosis present

## 2019-09-13 DIAGNOSIS — M79652 Pain in left thigh: Secondary | ICD-10-CM | POA: Diagnosis present

## 2019-09-13 DIAGNOSIS — D72829 Elevated white blood cell count, unspecified: Secondary | ICD-10-CM | POA: Diagnosis present

## 2019-09-13 DIAGNOSIS — G301 Alzheimer's disease with late onset: Secondary | ICD-10-CM | POA: Diagnosis not present

## 2019-09-13 DIAGNOSIS — M9712XA Periprosthetic fracture around internal prosthetic left knee joint, initial encounter: Secondary | ICD-10-CM | POA: Diagnosis not present

## 2019-09-13 DIAGNOSIS — F0280 Dementia in other diseases classified elsewhere without behavioral disturbance: Secondary | ICD-10-CM | POA: Diagnosis present

## 2019-09-13 DIAGNOSIS — E785 Hyperlipidemia, unspecified: Secondary | ICD-10-CM | POA: Diagnosis present

## 2019-09-13 DIAGNOSIS — M545 Low back pain: Secondary | ICD-10-CM | POA: Diagnosis present

## 2019-09-13 DIAGNOSIS — H547 Unspecified visual loss: Secondary | ICD-10-CM | POA: Diagnosis present

## 2019-09-13 DIAGNOSIS — M1990 Unspecified osteoarthritis, unspecified site: Secondary | ICD-10-CM | POA: Diagnosis present

## 2019-09-13 DIAGNOSIS — Z79899 Other long term (current) drug therapy: Secondary | ICD-10-CM

## 2019-09-13 DIAGNOSIS — Z888 Allergy status to other drugs, medicaments and biological substances status: Secondary | ICD-10-CM

## 2019-09-13 DIAGNOSIS — D62 Acute posthemorrhagic anemia: Secondary | ICD-10-CM | POA: Diagnosis not present

## 2019-09-13 DIAGNOSIS — E0590 Thyrotoxicosis, unspecified without thyrotoxic crisis or storm: Secondary | ICD-10-CM | POA: Diagnosis present

## 2019-09-13 DIAGNOSIS — F0281 Dementia in other diseases classified elsewhere with behavioral disturbance: Secondary | ICD-10-CM | POA: Diagnosis not present

## 2019-09-13 DIAGNOSIS — S72452A Displaced supracondylar fracture without intracondylar extension of lower end of left femur, initial encounter for closed fracture: Secondary | ICD-10-CM

## 2019-09-13 DIAGNOSIS — Z833 Family history of diabetes mellitus: Secondary | ICD-10-CM

## 2019-09-13 DIAGNOSIS — G309 Alzheimer's disease, unspecified: Secondary | ICD-10-CM | POA: Diagnosis present

## 2019-09-13 DIAGNOSIS — S72352A Displaced comminuted fracture of shaft of left femur, initial encounter for closed fracture: Secondary | ICD-10-CM

## 2019-09-13 DIAGNOSIS — F418 Other specified anxiety disorders: Secondary | ICD-10-CM | POA: Diagnosis present

## 2019-09-13 DIAGNOSIS — F329 Major depressive disorder, single episode, unspecified: Secondary | ICD-10-CM | POA: Diagnosis present

## 2019-09-13 DIAGNOSIS — S72302A Unspecified fracture of shaft of left femur, initial encounter for closed fracture: Secondary | ICD-10-CM

## 2019-09-13 LAB — CBC WITH DIFFERENTIAL/PLATELET
Abs Immature Granulocytes: 0.03 10*3/uL (ref 0.00–0.07)
Basophils Absolute: 0 10*3/uL (ref 0.0–0.1)
Basophils Relative: 0 %
Eosinophils Absolute: 0 10*3/uL (ref 0.0–0.5)
Eosinophils Relative: 0 %
HCT: 34.2 % — ABNORMAL LOW (ref 36.0–46.0)
Hemoglobin: 11.1 g/dL — ABNORMAL LOW (ref 12.0–15.0)
Immature Granulocytes: 0 %
Lymphocytes Relative: 12 %
Lymphs Abs: 1.3 10*3/uL (ref 0.7–4.0)
MCH: 29.8 pg (ref 26.0–34.0)
MCHC: 32.5 g/dL (ref 30.0–36.0)
MCV: 91.7 fL (ref 80.0–100.0)
Monocytes Absolute: 0.9 10*3/uL (ref 0.1–1.0)
Monocytes Relative: 8 %
Neutro Abs: 8.7 10*3/uL — ABNORMAL HIGH (ref 1.7–7.7)
Neutrophils Relative %: 80 %
Platelets: 315 10*3/uL (ref 150–400)
RBC: 3.73 MIL/uL — ABNORMAL LOW (ref 3.87–5.11)
RDW: 13.6 % (ref 11.5–15.5)
WBC: 11 10*3/uL — ABNORMAL HIGH (ref 4.0–10.5)
nRBC: 0 % (ref 0.0–0.2)

## 2019-09-13 LAB — BASIC METABOLIC PANEL
Anion gap: 11 (ref 5–15)
BUN: 33 mg/dL — ABNORMAL HIGH (ref 8–23)
CO2: 24 mmol/L (ref 22–32)
Calcium: 9.4 mg/dL (ref 8.9–10.3)
Chloride: 105 mmol/L (ref 98–111)
Creatinine, Ser: 0.89 mg/dL (ref 0.44–1.00)
GFR calc Af Amer: >60 mL/min (ref >60)
GFR calc non Af Amer: 57 mL/min — ABNORMAL LOW (ref >60)
Glucose, Bld: 131 mg/dL — ABNORMAL HIGH (ref 70–99)
Potassium: 4.4 mmol/L (ref 3.5–5.1)
Sodium: 140 mmol/L (ref 135–145)

## 2019-09-13 LAB — SARS CORONAVIRUS 2 BY RT PCR (HOSPITAL ORDER, PERFORMED IN ~~LOC~~ HOSPITAL LAB): SARS Coronavirus 2: NEGATIVE

## 2019-09-13 MED ORDER — FENTANYL CITRATE (PF) 100 MCG/2ML IJ SOLN: 50.0000 ug | Freq: Once | INTRAMUSCULAR | Status: DC

## 2019-09-13 MED ORDER — OXYCODONE-ACETAMINOPHEN 5-325 MG PO TABS
1.0000 | ORAL_TABLET | ORAL | Status: DC | PRN
  Administered 2019-09-16 – 2019-09-19 (×6): 1 via ORAL
  Filled 2019-09-13 (×6): qty 1

## 2019-09-13 MED ORDER — FENTANYL CITRATE (PF) 100 MCG/2ML IJ SOLN
50.0000 ug | Freq: Once | INTRAMUSCULAR | Status: AC
  Administered 2019-09-13: 50 ug via INTRAVENOUS
  Filled 2019-09-13: qty 2

## 2019-09-13 MED ORDER — FENTANYL CITRATE (PF) 100 MCG/2ML IJ SOLN
25.0000 ug | Freq: Once | INTRAMUSCULAR | Status: AC
  Administered 2019-09-13: 25 ug via INTRAVENOUS
  Filled 2019-09-13: qty 2

## 2019-09-13 MED ORDER — MORPHINE SULFATE (PF) 2 MG/ML IV SOLN
0.5000 mg | INTRAVENOUS | Status: DC | PRN
  Administered 2019-09-14 – 2019-09-15 (×3): 0.5 mg via INTRAVENOUS
  Filled 2019-09-13 (×3): qty 1

## 2019-09-13 MED ORDER — METHOCARBAMOL 500 MG PO TABS
500.0000 mg | ORAL_TABLET | Freq: Three times a day (TID) | ORAL | Status: DC | PRN
  Administered 2019-09-16 (×2): 500 mg via ORAL
  Filled 2019-09-13 (×2): qty 1

## 2019-09-13 NOTE — ED Provider Notes (Addendum)
[facility] DEPT Provider Note   CSN: [id number] Arrival date & time: 09/13/19  2014     History   Chief Complaint Chief Complaint  Patient presents with   Hip Pain   Fall    HPI Lisa [name] is a 85 y.o. female.  Present for department chief complaint of left hip pain.  Came from [facility], reportedly was more anxious than normal and had an unwitnessed fall and noted to have left hip deformity and pain.  Patient unable to provide any additional history.  Per facility patient is at baseline mental status.  History limited due to patient's dementia, level 5.     HPI  Past Medical History:  Diagnosis Date   Dementia ([place])    Glaucoma    HYPERLIPIDEMIA 11/23/2007   HYPERTENSION 11/23/2007   HYPOTHYROIDISM 11/23/2007   LOW BACK PAIN 11/23/2007   Macular degeneration    OSTEOARTHRITIS 11/23/2007   PERIPHERAL VASCULAR DISEASE 07/11/2008    Patient Active Problem List   Diagnosis Date Noted   Left wrist injury, initial encounter 06/03/2017   Distal radius fracture, left 06/03/2017   Depression 09/02/2016   Well adult exam 08/31/2014   Fatigue 07/22/2011   Syncopal episodes 06/25/2011   Hyponatremia 06/25/2011   Alzheimer disease ([place]) 06/25/2011   DIARRHEA 08/01/2008   PERIPHERAL VASCULAR DISEASE 07/11/2008   NEOPLASM OF UNCERTAIN BEHAVIOR OF SKIN 05/23/2008   OTHER NONTHROMBOCYTOPENIC PURPURAS 05/23/2008   MYALGIA 05/23/2008   Hypothyroidism 11/23/2007   HYPERLIPIDEMIA 11/23/2007   Essential hypertension 11/23/2007   OSTEOARTHRITIS 11/23/2007   LOW BACK PAIN 11/23/2007    Past Surgical History:  Procedure Laterality Date   LUMBAR FUSION     TOTAL KNEE ARTHROPLASTY       OB History   No obstetric history on file.      Home Medications    Prior to Admission medications   Medication Sig Start Date End Date Taking? Authorizing Provider  acetaminophen (TYLENOL) 325 MG tablet Take 2 tablets  (650 mg total) by mouth every 6 (six) hours as needed for moderate pain. Patient not taking: Reported on 12/07/2018 06/07/18   Langston Masker B, PA-C  acetaminophen (TYLENOL) 500 MG tablet Take 500 mg by mouth every 6 (six) hours. Not to exceed 2000 mg per 24 hours     [provider]  acetaminophen (TYLENOL) 500 MG tablet Take 500 mg by mouth every 4 (four) hours as needed for mild pain, fever or headache.    [provider]  alum & mag hydroxide-simeth ([place]) 200-200-20 MG/5ML suspension Take 30 mLs by mouth every 6 (six) hours as needed for indigestion or heartburn (Do not exceed 4 doses in 24 hours.).     [provider]  amLODipine (NORVASC) 5 MG tablet Take 1 tablet (5 mg total) by mouth daily. 08/24/17   Plotnikov, Evie Lacks, MD  cephALEXin (KEFLEX) 500 MG capsule Take 1 capsule (500 mg total) by mouth 2 (two) times daily. 03/31/19   Rancour, Annie Main, MD  Cholecalciferol (VITAMIN D3) 1000 UNITS CAPS Take 1 capsule by mouth daily. Patient taking differently: Take 1,000 Units by mouth daily.  09/16/11   Plotnikov, Evie Lacks, MD  erythromycin ophthalmic ointment Place a 1/2 inch ribbon of ointment into the lower eyelid 3 times daily for 1 week 03/31/19   Rancour, Annie Main, MD  escitalopram (LEXAPRO) 10 MG tablet Take 10 mg by mouth daily.    [provider]  guaifenesin (ROBITUSSIN) 100 MG/5ML syrup Take 200 mg  by mouth every 6 (six) hours as needed for cough. Not to exceed 4 doses in 24 hours    [provider]  loperamide (IMODIUM) 2 MG capsule Take 2 mg by mouth as needed for diarrhea or loose stools (Do not exceed 8 doses in 24 hours.).     [provider]  LORazepam (ATIVAN) 0.5 MG tablet Take 0.5 mg by mouth 2 (two) times daily.    [provider]  LORazepam (ATIVAN) 0.5 MG tablet Take 0.5 mg by mouth every 6 (six) hours as needed for anxiety.    [provider]  losartan (COZAAR) 100 MG tablet TAKE 1 TABLET BY MOUTH EVERY  DAY Patient taking differently: Take 100 mg by mouth daily.  09/15/17   Plotnikov, Evie Lacks, MD  Loteprednol Etabonate (LOTEMAX) 0.5 % OINT Place 1 strip into both eyes 2 (two) times daily.    [provider]  magnesium hydroxide (MILK OF MAGNESIA) 400 MG/5ML suspension Take 30 mLs by mouth at bedtime as needed for mild constipation.    [provider]  Menthol, Topical Analgesic, (BIOFREEZE) 4 % GEL Apply 1 application topically 3 (three) times daily as needed (left hip pain).    [provider]  methimazole (TAPAZOLE) 5 MG tablet Take 5 mg by mouth daily.  09/03/18   [provider]  metoprolol succinate (TOPROL-XL) 25 MG 24 hr tablet Take 25 mg by mouth daily. 09/03/18   [provider]  neomycin-bacitracin-polymyxin (NEOSPORIN) ointment Apply 1 application topically as needed for wound care.    [provider]  OVER THE COUNTER MEDICATION Take 1 Can by mouth 4 (four) times daily. House Shake    [provider]  Travoprost, BAK Free, (TRAVATAN Z) 0.004 % SOLN ophthalmic solution Place 2 drops into both eyes daily.     [provider]  traZODone (DESYREL) 50 MG tablet Take 1-2 tablets (50-100 mg total) by mouth at bedtime. Patient taking differently: Take 50 mg by mouth at bedtime.  10/09/17   Plotnikov, Evie Lacks, MD    Family History Family History  Problem Relation Age of Onset   Hyperlipidemia Mother    Diabetes Father    Hypertension Other     Social History Social History   Tobacco Use   Smoking status: Never Smoker   Smokeless tobacco: Never Used  Substance Use Topics   Alcohol use: No   Drug use: No     Allergies   Aricept [donepezil hydrochloride]   Review of Systems Review of Systems  Unable to perform ROS: Dementia     Physical Exam Updated Vital Signs BP (!) 173/141    Pulse 92    Temp 98 F (36.7 C) (Axillary)    Resp 19    SpO2 100%   Physical Exam Vitals signs and nursing  note reviewed.  Constitutional:      General: She is not in acute distress.    Appearance: She is well-developed.     Comments: Elderly, no acute distress  HENT:     Head: Normocephalic and atraumatic.  Eyes:     Conjunctiva/sclera: Conjunctivae normal.  Neck:     Musculoskeletal: Neck supple.  Cardiovascular:     Rate and Rhythm: Normal rate and regular rhythm.     Heart sounds: No murmur.  Pulmonary:     Effort: Pulmonary effort is normal. No respiratory distress.     Breath sounds: Normal breath sounds.  Abdominal:     Palpations: Abdomen is soft.  Tenderness: There is no abdominal tenderness.  Musculoskeletal:     Comments: LLE: Left leg is shortened, internally rotated, DP pulses intact, good distal cap refill, distal motor and sensation intact there is tenderness throughout upper leg, no tenderness over lower leg foot RLE: no TTP throughout extremity, no obvious deformity RUE: no TTP throughout extremity, no obvious deformity LUE: no TTP throughout extremity, no obvious deformity Back: no TTP or deformity throughout C, T, L spine  Skin:    General: Skin is warm and dry.  Neurological:     General: No focal deficit present.     Mental Status: She is alert.     Comments: Baseline confusion but alert      ED Treatments / Results  Labs (all labs ordered are listed, but only abnormal results are displayed) Labs Reviewed  CBC WITH DIFFERENTIAL/PLATELET  BASIC METABOLIC PANEL    EKG None  Radiology Dg Chest 2 View  Result Date: 09/13/2019 CLINICAL DATA:  Left femur fracture. Fall today. EXAM: CHEST - 2 VIEW COMPARISON:  Radiograph 03/31/2019 FINDINGS: Patient had difficulty with positioning and could not lay flat. Rotated exam. Unchanged heart size and mediastinal contours allowing for positioning. No acute airspace disease, large pleural effusion or pneumothorax. No acute osseous abnormalities are seen. IMPRESSION: Rotated exam without acute abnormality.  Electronically Signed   By: Keith Rake M.D.   On: 09/13/2019 21:29   Dg Hip Unilat With Pelvis 2-3 Views Left  Result Date: 09/13/2019 CLINICAL DATA:  Fall at care facility earlier today. Left hip/femur pain. Deformity. EXAM: DG HIP (WITH OR WITHOUT PELVIS) 2-3V LEFT COMPARISON:  Concurrent femur radiograph. FINDINGS: Patient had difficulty with positioning, with combative, and could not lie flat. Left hip arthroplasty is intact. No periprosthetic lucency or fracture. Mid-distal femoral shaft fracture better visualized on concurrent femur exam. IMPRESSION: 1. Intact left hip arthroplasty. No periprosthetic fracture. 2. Mid-distal femoral shaft fracture better visualized on concurrent femur exam. 3. Limited evaluation due to patient medical condition. Electronically Signed   By: Keith Rake M.D.   On: 09/13/2019 21:27   Dg Femur Min 2 Views Left  Result Date: 09/13/2019 CLINICAL DATA:  85 year old female status post fall today. Combative. EXAM: LEFT FEMUR 2 VIEWS COMPARISON:  Left hip series reported separately. FINDINGS: Left hip details are reported separately. Superimposed left total knee arthroplasty. Long segment spiral fracture of the distal left femoral shaft terminating about 4 centimeters proximal to the distal femoral hardware. 1/2 shaft width medial displacement, posterior displacement, as well as over riding and posterior angulation. Grossly aligned total knee arthroplasty components. IMPRESSION: 1. Long segment spiral fracture of the distal left femoral shaft with 1/2 shaft width displacement, over-riding, and posterior angulation. 2. Superimposed left total knee arthroplasty appears intact. 3. Left hip details reported separately. Electronically Signed   By: Genevie Ann M.D.   On: 09/13/2019 21:30    Procedures Procedures (including critical care time)  Medications Ordered in ED Medications - No data to display   Initial Impression / Assessment and Plan / ED Course  I have  reviewed the triage vital signs and the nursing notes.  Pertinent labs & imaging results that were available during my care of the patient were reviewed by me and considered in my medical decision making (see chart for details).  Clinical Course as of Sep 14 3  Tue Sep 13, 2019  2150 Discussed with Langley Gauss with hospice care   [RD]  2150 Discussed with Juliann Pulse, daughter   [RD]  Clinical Course User Index [RD] Lucrezia Starch, MD       85 year old dementia on hospice care presents with unwitnessed fall, left leg deformity.  Work-up concerning for left femur fracture. Neurovascularly intact. No other obvious trauma on my exam.  Discussed case with family, they are potentially interested in surgery.  Discussed case with Dr. Lyla Glassing with orthopedic surgery who recommended hospitalist admission, likely OR tomorrow.  Request n.p.o. at midnight.  Discussed with Dr. Blaine Hamper who will admit patient.  Final Clinical Impressions(s) / ED Diagnoses   Final diagnoses:  Fall, initial encounter  Closed fracture of shaft of left femur, unspecified fracture morphology, initial encounter [facility])    ED Discharge Orders    None       Lucrezia Starch, MD 09/14/19 0009    Lucrezia Starch, MD 09/14/19 0010

## 2019-09-13 NOTE — ED Notes (Signed)
Patient transported to CT 

## 2019-09-13 NOTE — ED Notes (Signed)
Patient transported to X-ray 

## 2019-09-13 NOTE — H&P (Signed)
History and Physical    Lisa [name] DOB: [birth year] DOA: 09/13/2019  Referring MD/NP/PA:   PCP: Cassandria Anger, MD   Patient coming from:  The patient is coming from [facility].  At baseline, pt is dependent for most of ADL.        Chief Complaint: fall and left upper leg pain  HPI: Lisa [name] is a 85 y.o. female with medical history significant of hypertension, hyperlipidemia, hypothyroidism-hypothyroidism, depression, PVD, dementia, under hospice, glaucoma with very poor vision, chronic back pain, who presented with fall and left upper leg pain.  Patient has Alzheimer's disease, and is unable to provide accurate medical history, therefore, I called her daughter, who provided most of the history.  Per her daughter, patient had unwitnessed fall, and developed pain in the left upper leg.  Patient seems to have severe pain.  Per her daughter, no chest pain, shortness breath, cough, fever or chills, no nausea, vomiting, diarrhea, abdominal pain.  Daughter states that patient was agitated and anxious for whole day which may be due to UTI. Pt had similar symptoms before when she had UTI.  At her normal baseline, patient recognizes family voice, but does not recognize family member, sometimes she knows her own name.  Mental status seems to be at baseline.  ED Course: pt was found to have pending urinalysis, WBC 11.0, electrolytes renal function okay, pending COVID-19 test, temperature normal, blood pressure 205/158, 127/77, tachycardia, oxygen saturation 96% on room air.  Chest x-ray negative.  X-ray of left femur and left hip/pelvis that showed displaced left distal femur shaft fracture.  Pending CT of head and CT of C-spine.  Patient is admitted to [facility] bed as inpatient.  Orthopedic surgeon, Dr. Lyla Glassing was consulted.  Review of Systems: Could not be reviewed due to Alzheimer's disease.   Allergy:  Allergies  Allergen Reactions   Aricept [Donepezil Hydrochloride] Other  (See Comments)    Syncope NOT ON MAR 07/03/18    Past Medical History:  Diagnosis Date   Dementia ([place])    Glaucoma    HYPERLIPIDEMIA 11/23/2007   HYPERTENSION 11/23/2007   HYPOTHYROIDISM 11/23/2007   LOW BACK PAIN 11/23/2007   Macular degeneration    OSTEOARTHRITIS 11/23/2007   PERIPHERAL VASCULAR DISEASE 07/11/2008    Past Surgical History:  Procedure Laterality Date   LUMBAR FUSION     TOTAL KNEE ARTHROPLASTY      Social History:  reports that she has never smoked. She has never used smokeless tobacco. She reports that she does not drink alcohol or use drugs.  Family History:  Family History  Problem Relation Age of Onset   Hyperlipidemia Mother    Diabetes Father    Hypertension Other      Prior to Admission medications   Medication Sig Start Date End Date Taking? Authorizing Provider  acetaminophen (TYLENOL) 325 MG tablet Take 2 tablets (650 mg total) by mouth every 6 (six) hours as needed for moderate pain. Patient not taking: Reported on 12/07/2018 06/07/18   Langston Masker B, PA-C  acetaminophen (TYLENOL) 500 MG tablet Take 500 mg by mouth every 6 (six) hours. Not to exceed 2000 mg per 24 hours     [provider]  acetaminophen (TYLENOL) 500 MG tablet Take 500 mg by mouth every 4 (four) hours as needed for mild pain, fever or headache.    [provider]  alum & mag hydroxide-simeth ([place]) 200-200-20 MG/5ML suspension Take 30 mLs by mouth every 6 (six) hours as needed for  indigestion or heartburn (Do not exceed 4 doses in 24 hours.).     [provider]  amLODipine (NORVASC) 5 MG tablet Take 1 tablet (5 mg total) by mouth daily. 08/24/17   Plotnikov, Evie Lacks, MD  cephALEXin (KEFLEX) 500 MG capsule Take 1 capsule (500 mg total) by mouth 2 (two) times daily. 03/31/19   Rancour, Annie Main, MD  Cholecalciferol (VITAMIN D3) 1000 UNITS CAPS Take 1 capsule by mouth daily. Patient taking differently: Take 1,000 Units by mouth daily.   09/16/11   Plotnikov, Evie Lacks, MD  erythromycin ophthalmic ointment Place a 1/2 inch ribbon of ointment into the lower eyelid 3 times daily for 1 week 03/31/19   Rancour, Annie Main, MD  escitalopram (LEXAPRO) 10 MG tablet Take 10 mg by mouth daily.    [provider]  guaifenesin (ROBITUSSIN) 100 MG/5ML syrup Take 200 mg by mouth every 6 (six) hours as needed for cough. Not to exceed 4 doses in 24 hours    [provider]  loperamide (IMODIUM) 2 MG capsule Take 2 mg by mouth as needed for diarrhea or loose stools (Do not exceed 8 doses in 24 hours.).     [provider]  LORazepam (ATIVAN) 0.5 MG tablet Take 0.5 mg by mouth 2 (two) times daily.    [provider]  LORazepam (ATIVAN) 0.5 MG tablet Take 0.5 mg by mouth every 6 (six) hours as needed for anxiety.    [provider]  losartan (COZAAR) 100 MG tablet TAKE 1 TABLET BY MOUTH EVERY DAY Patient taking differently: Take 100 mg by mouth daily.  09/15/17   Plotnikov, Evie Lacks, MD  Loteprednol Etabonate (LOTEMAX) 0.5 % OINT Place 1 strip into both eyes 2 (two) times daily.    [provider]  magnesium hydroxide (MILK OF MAGNESIA) 400 MG/5ML suspension Take 30 mLs by mouth at bedtime as needed for mild constipation.    [provider]  Menthol, Topical Analgesic, (BIOFREEZE) 4 % GEL Apply 1 application topically 3 (three) times daily as needed (left hip pain).    [provider]  methimazole (TAPAZOLE) 5 MG tablet Take 5 mg by mouth daily.  09/03/18   [provider]  metoprolol succinate (TOPROL-XL) 25 MG 24 hr tablet Take 25 mg by mouth daily. 09/03/18   [provider]  neomycin-bacitracin-polymyxin (NEOSPORIN) ointment Apply 1 application topically as needed for wound care.    [provider]  OVER THE COUNTER MEDICATION Take 1 Can by mouth 4 (four) times daily. House Shake    [provider]  Travoprost, BAK Free, (TRAVATAN Z) 0.004 % SOLN  ophthalmic solution Place 2 drops into both eyes daily.     [provider]  traZODone (DESYREL) 50 MG tablet Take 1-2 tablets (50-100 mg total) by mouth at bedtime. Patient taking differently: Take 50 mg by mouth at bedtime.  10/09/17   Plotnikov, Evie Lacks, MD    Physical Exam: Vitals:   09/13/19 2344 09/14/19 0000 09/14/19 0030 09/14/19 0232  BP: (!) 142/94 (!) 151/58 (!) 146/122 122/87  Pulse: 99 88 87 83  Resp: (!) 22 (!) 31 18 20   Temp:    98.5 F (36.9 C)  TempSrc:    Oral  SpO2: 98% 99% 95%    General: Not in acute distress HEENT:       Eyes: PERRL, EOMI, no scleral icterus.       ENT: No discharge from the ears and nose, no pharynx injection, no tonsillar enlargement.  Neck: No JVD, no bruit, no mass felt. Heme: No neck lymph node enlargement. Cardiac: S1/S2, RRR, No murmurs, No gallops or rubs. Respiratory:  No rales, wheezing, rhonchi or rubs. GI: Soft, nondistended, nontender, no rebound pain, no organomegaly, BS present. GU: No hematuria Ext: No pitting leg edema bilaterally. 2+DP/PT pulse bilaterally. Musculoskeletal: has tenderness in left upper thigh. Skin: No rashes.  Neuro: Alert, not oriented X3, cranial nerves II-XII grossly intact, moves all extremities. Psych: Patient is not psychotic, no suicidal or hemocidal ideation.  Labs on Admission: I have personally reviewed following labs and imaging studies  CBC: Recent Labs  Lab 09/13/19 2228  WBC 11.0*  NEUTROABS 8.7*  HGB 11.1*  HCT 34.2*  MCV 91.7  PLT 123456   Basic Metabolic Panel: Recent Labs  Lab 09/13/19 2228  NA 140  K 4.4  CL 105  CO2 24  GLUCOSE 131*  BUN 33*  CREATININE 0.89  CALCIUM 9.4   GFR: CrCl cannot be calculated (Unknown ideal weight.). Liver Function Tests: No results for input(s): AST, ALT, ALKPHOS, BILITOT, PROT, ALBUMIN in the last 168 hours. No results for input(s): LIPASE, AMYLASE in the last 168 hours. No results for input(s): AMMONIA in the last 168  hours. Coagulation Profile: Recent Labs  Lab 09/14/19 0118  INR 1.0   Cardiac Enzymes: No results for input(s): CKTOTAL, CKMB, CKMBINDEX, TROPONINI in the last 168 hours. BNP (last 3 results) No results for input(s): PROBNP in the last 8760 hours. HbA1C: No results for input(s): HGBA1C in the last 72 hours. CBG: No results for input(s): GLUCAP in the last 168 hours. Lipid Profile: No results for input(s): CHOL, HDL, LDLCALC, TRIG, CHOLHDL, LDLDIRECT in the last 72 hours. Thyroid Function Tests: No results for input(s): TSH, T4TOTAL, FREET4, T3FREE, THYROIDAB in the last 72 hours. Anemia Panel: No results for input(s): VITAMINB12, FOLATE, FERRITIN, TIBC, IRON, RETICCTPCT in the last 72 hours. Urine analysis:    Component Value Date/Time   COLORURINE YELLOW 03/31/2019 0049   APPEARANCEUR CLEAR 03/31/2019 0049   LABSPEC 1.013 03/31/2019 0049   PHURINE 6.0 03/31/2019 0049   GLUCOSEU NEGATIVE 03/31/2019 0049   GLUCOSEU NEGATIVE 09/04/2016 0910   HGBUR NEGATIVE 03/31/2019 0049   BILIRUBINUR NEGATIVE 03/31/2019 0049   KETONESUR NEGATIVE 03/31/2019 0049   PROTEINUR 30 (A) 03/31/2019 0049   UROBILINOGEN 0.2 09/04/2016 0910   NITRITE POSITIVE (A) 03/31/2019 0049   LEUKOCYTESUR TRACE (A) 03/31/2019 0049   Sepsis Labs: @LABRCNTIP (procalcitonin:4,lacticidven:4) ) Recent Results (from the past 240 hour(s))  SARS Coronavirus 2 [facility] order, Performed in [facility] hospital lab) Nasopharyngeal Nasopharyngeal Swab     Status: None   Collection Time: 09/13/19 10:32 PM   Specimen: Nasopharyngeal Swab  Result Value Ref Range Status   SARS Coronavirus 2 NEGATIVE NEGATIVE Final    Comment: (NOTE) If result is NEGATIVE SARS-CoV-2 target nucleic acids are NOT DETECTED. The SARS-CoV-2 RNA is generally detectable in upper and lower  respiratory specimens during the acute phase of infection. The lowest  concentration of SARS-CoV-2 viral copies this assay can detect is 250  copies / mL.  A negative result does not preclude SARS-CoV-2 infection  and should not be used as the sole basis for treatment or other  patient management decisions.  A negative result may occur with  improper specimen collection / handling, submission of specimen other  than nasopharyngeal swab, presence of viral mutation(s) within the  areas targeted by this assay, and inadequate number of viral copies  (<250 copies / mL).  A negative result must be combined with clinical  observations, patient history, and epidemiological information. If result is POSITIVE SARS-CoV-2 target nucleic acids are DETECTED. The SARS-CoV-2 RNA is generally detectable in upper and lower  respiratory specimens dur ing the acute phase of infection.  Positive  results are indicative of active infection with SARS-CoV-2.  Clinical  correlation with patient history and other diagnostic information is  necessary to determine patient infection status.  Positive results do  not rule out bacterial infection or co-infection with other viruses. If result is PRESUMPTIVE POSTIVE SARS-CoV-2 nucleic acids MAY BE PRESENT.   A presumptive positive result was obtained on the submitted specimen  and confirmed on repeat testing.  While 2019 novel coronavirus  (SARS-CoV-2) nucleic acids may be present in the submitted sample  additional confirmatory testing may be necessary for epidemiological  and / or clinical management purposes  to differentiate between  SARS-CoV-2 and other Sarbecovirus currently known to infect humans.  If clinically indicated additional testing with an alternate test  methodology [phone]) is advised. The SARS-CoV-2 RNA is generally  detectable in upper and lower respiratory sp ecimens during the acute  phase of infection. The expected result is Negative. Fact Sheet for Patients:  StrictlyIdeas.no Fact Sheet for Healthcare Providers: BankingDealers.co.za This test is not  yet approved or cleared by the [place] FDA and has been authorized for detection and/or diagnosis of SARS-CoV-2 by FDA under an Emergency Use Authorization (EUA).  This EUA will remain in effect (meaning this test can be used) for the duration of the COVID-19 declaration under Section 564(b)(1) of the Act, 21 U.S.C. section 360bbb-3(b)(1), unless the authorization is terminated or revoked sooner. Performed at [facility], [place] [address] [place], [place] [address]      Radiological Exams on Admission: Dg Chest 2 View  Result Date: 09/13/2019 CLINICAL DATA:  Left femur fracture. Fall today. EXAM: CHEST - 2 VIEW COMPARISON:  Radiograph 03/31/2019 FINDINGS: Patient had difficulty with positioning and could not lay flat. Rotated exam. Unchanged heart size and mediastinal contours allowing for positioning. No acute airspace disease, large pleural effusion or pneumothorax. No acute osseous abnormalities are seen. IMPRESSION: Rotated exam without acute abnormality. Electronically Signed   By: Keith Rake M.D.   On: 09/13/2019 21:29   Ct Head Wo Contrast  Result Date: 09/13/2019 CLINICAL DATA:  85 year old female status post unwitnessed fall. Left femur fracture. Altered mental status, combative. EXAM: CT HEAD WITHOUT CONTRAST CT CERVICAL SPINE WITHOUT CONTRAST TECHNIQUE: Multidetector CT imaging of the head and cervical spine was performed following the standard protocol without intravenous contrast. Multiplanar CT image reconstructions of the cervical spine were also generated. COMPARISON:  Head and cervical Spine CT 03/31/2019. FINDINGS: CT HEAD FINDINGS Brain: Stable cerebral volume. Stable gray-white matter differentiation throughout the brain. Mild to moderate for age patchy white matter hypodensity. No midline shift, ventriculomegaly, mass effect, evidence of mass lesion, intracranial hemorrhage or evidence of cortically based acute infarction. No cortical  encephalomalacia. Vascular: Mild Calcified atherosclerosis at the skull base. No suspicious intracranial vascular hyperdensity. Skull: Stable and intact. Sinuses/Orbits: Visualized paranasal sinuses and mastoids are stable and well pneumatized. Other: No acute orbit or scalp soft tissue finding. CT CERVICAL SPINE FINDINGS Motion artifact, most pronounced at C1-C2. Alignment: Stable since April, mild degenerative appearing anterolisthesis at C4-C5 and C7-T1. Bilateral posterior element alignment is within normal limits. Skull base and vertebrae: Motion artifact at the skull base, C1 and C2. Chronic degenerative changes there. No acute osseous  abnormality identified. Soft tissues and spinal canal: No prevertebral fluid or swelling. No visible canal hematoma. Disc levels: Stable. Chronic severe C1-C2 degeneration. Widespread cervical facet arthropathy. Chronic severe C6-C7 disc and endplate degeneration. Upper chest: Visible upper thoracic levels appear intact. Negative lung apices. IMPRESSION: 1. No acute traumatic injury identified in the head or cervical spine. Detail of C1 and C2 degraded by motion. 2. Stable non contrast CT appearance of the brain since April. Mild to moderate white matter changes most commonly due to small vessel disease. 3. Chronically advanced cervical spine degeneration. Electronically Signed   By: Genevie Ann M.D.   On: 09/13/2019 23:52   Ct Cervical Spine Wo Contrast  Result Date: 09/13/2019 CLINICAL DATA:  85 year old female status post unwitnessed fall. Left femur fracture. Altered mental status, combative. EXAM: CT HEAD WITHOUT CONTRAST CT CERVICAL SPINE WITHOUT CONTRAST TECHNIQUE: Multidetector CT imaging of the head and cervical spine was performed following the standard protocol without intravenous contrast. Multiplanar CT image reconstructions of the cervical spine were also generated. COMPARISON:  Head and cervical Spine CT 03/31/2019. FINDINGS: CT HEAD FINDINGS Brain: Stable  cerebral volume. Stable gray-white matter differentiation throughout the brain. Mild to moderate for age patchy white matter hypodensity. No midline shift, ventriculomegaly, mass effect, evidence of mass lesion, intracranial hemorrhage or evidence of cortically based acute infarction. No cortical encephalomalacia. Vascular: Mild Calcified atherosclerosis at the skull base. No suspicious intracranial vascular hyperdensity. Skull: Stable and intact. Sinuses/Orbits: Visualized paranasal sinuses and mastoids are stable and well pneumatized. Other: No acute orbit or scalp soft tissue finding. CT CERVICAL SPINE FINDINGS Motion artifact, most pronounced at C1-C2. Alignment: Stable since April, mild degenerative appearing anterolisthesis at C4-C5 and C7-T1. Bilateral posterior element alignment is within normal limits. Skull base and vertebrae: Motion artifact at the skull base, C1 and C2. Chronic degenerative changes there. No acute osseous abnormality identified. Soft tissues and spinal canal: No prevertebral fluid or swelling. No visible canal hematoma. Disc levels: Stable. Chronic severe C1-C2 degeneration. Widespread cervical facet arthropathy. Chronic severe C6-C7 disc and endplate degeneration. Upper chest: Visible upper thoracic levels appear intact. Negative lung apices. IMPRESSION: 1. No acute traumatic injury identified in the head or cervical spine. Detail of C1 and C2 degraded by motion. 2. Stable non contrast CT appearance of the brain since April. Mild to moderate white matter changes most commonly due to small vessel disease. 3. Chronically advanced cervical spine degeneration. Electronically Signed   By: Genevie Ann M.D.   On: 09/13/2019 23:52   Dg Hip Unilat With Pelvis 2-3 Views Left  Result Date: 09/13/2019 CLINICAL DATA:  Fall at care facility earlier today. Left hip/femur pain. Deformity. EXAM: DG HIP (WITH OR WITHOUT PELVIS) 2-3V LEFT COMPARISON:  Concurrent femur radiograph. FINDINGS: Patient had  difficulty with positioning, with combative, and could not lie flat. Left hip arthroplasty is intact. No periprosthetic lucency or fracture. Mid-distal femoral shaft fracture better visualized on concurrent femur exam. IMPRESSION: 1. Intact left hip arthroplasty. No periprosthetic fracture. 2. Mid-distal femoral shaft fracture better visualized on concurrent femur exam. 3. Limited evaluation due to patient medical condition. Electronically Signed   By: Keith Rake M.D.   On: 09/13/2019 21:27   Dg Femur Min 2 Views Left  Result Date: 09/13/2019 CLINICAL DATA:  85 year old female status post fall today. Combative. EXAM: LEFT FEMUR 2 VIEWS COMPARISON:  Left hip series reported separately. FINDINGS: Left hip details are reported separately. Superimposed left total knee arthroplasty. Long segment spiral fracture of the distal left  femoral shaft terminating about 4 centimeters proximal to the distal femoral hardware. 1/2 shaft width medial displacement, posterior displacement, as well as over riding and posterior angulation. Grossly aligned total knee arthroplasty components. IMPRESSION: 1. Long segment spiral fracture of the distal left femoral shaft with 1/2 shaft width displacement, over-riding, and posterior angulation. 2. Superimposed left total knee arthroplasty appears intact. 3. Left hip details reported separately. Electronically Signed   By: Genevie Ann M.D.   On: 09/13/2019 21:30     EKG: Not done in ED, will get one.   Assessment/Plan Principal Problem:   Closed fracture of shaft of left femur (HCC) Active Problems:   Essential hypertension   Alzheimer disease (HCC)   Depression   Fall   Leukocytosis   Hyperthyroidism   Closed fracture of shaft of left femur ([place]): As evidenced by x-ray. Patient has severe pain now.  Orthopedic surgeon, Dr. Lyla Glassing was consulted.   - will admit to Med-surg bed - Pain control: morphine prn and percocet - When necessary Zofran for nausea - Robaxin  for muscle spasm - type and cross - INR/PTT - PT/OT when able to (not ordered now) Leukocytosis: Likely due to stress-induced demargination. Patient does not have signs of infection. -Follow-up CBC - f/u UA  Essential hypertension: -Losartan, metoprolol, amlodipine -As needed hydralazine orally  Alzheimer disease ([place]): has agitation and anxiety today. -f/u UA  -continue home ativan  Depression with anxiety -Lexapro and ativan  Fall: -pt/ot when able to   Hyperthyroidism: -Continue methimazole    Perioperative Cardiac Risk: He has multiple comorbidities, but no hx of CAD, MI or CHF. Currently patient is dependent of his ADLs, IADLs due to Alzheimer's disease. No recent acute cardiac issues.  Patient does not have chest pain, shortness of breath, palpitation, leg edema.  At this time point, no further work up is needed. His GUPTA score perioperative myocardial infarction or cardaic arrest is 0.7 %, which is moderate risk.    Inpatient status:  # Patient requires inpatient status due to high intensity of service, high risk for further deterioration and high frequency of surveillance required.  I certify that at the point of admission it is my clinical judgment that the patient will require inpatient hospital care spanning beyond 2 midnights from the point of admission.   This patient has multiple chronic comorbidities including hypertension, hyperlipidemia, hypothyroidism-hypothyroidism, depression, PVD, dementia, under hospice, glaucoma with very poor vision, chronic back pain  Now patient has presenting with fall and closed fracture of shaft of left femur  The worrisome physical exam findings include tenderness in left upper thigh  The initial radiographic and laboratory data are worrisome because of displaced left femoral shaft fracture by x-ray  Current medical needs: please see my assessment and plan.  Predictability of an adverse outcome (risk): Patient has multiple  comorbidities, now presents with fall and closed fracture of shaft of left femur.  Patient likely needs surgery.  Given her old age, patient is at high risk for developing complications.  Patient needs to be treated in hospital for at least 2 days.          DVT ppx: SCD Code Status: DNR per her daughter Family Communication: Yes, patient's daughter by phone   Disposition Plan:  To be determined Consults called:  Ortho, Dr. Lyla Glassing Admission status: medical floor/inpt      Date of Service 09/14/2019    [facility]   If 7PM-7AM, please contact night-coverage [email or web address] Password [email or web address] 09/14/2019, 2:55  AM

## 2019-09-13 NOTE — ED Notes (Signed)
Denise from [facility] called for an update. [phone]

## 2019-09-13 NOTE — ED Triage Notes (Signed)
Pt arrives via EMS from [facility], is a hospice patient. Pt was reportedly anxious all day, unwitnessed Fall, L hip is internally rotated, faint pedal pulse felt by EMS. No LOC. Pt in c-collar upon arrival. Pt keeps grabbing at area above left knee/ thigh area. Baseline alert and oriented to name, at baseline. 100 mch Fentanyl given by EMS.

## 2019-09-14 ENCOUNTER — Encounter (HOSPITAL_COMMUNITY)
Admission: EM | Disposition: A | Discharge status: Skilled Nursing Facility | Payer: Self-pay | Source: Home / Self Care | Attending: Internal Medicine

## 2019-09-14 ENCOUNTER — Inpatient Hospital Stay (HOSPITAL_COMMUNITY): Payer: Medicare Other | Admitting: Certified Registered"

## 2019-09-14 ENCOUNTER — Inpatient Hospital Stay (HOSPITAL_COMMUNITY): Payer: Medicare Other

## 2019-09-14 ENCOUNTER — Encounter (HOSPITAL_COMMUNITY): Payer: Self-pay

## 2019-09-14 HISTORY — PX: ORIF FEMUR FRACTURE: SHX2119

## 2019-09-14 LAB — ABO/RH: ABO/RH(D): A NEG

## 2019-09-14 LAB — URINALYSIS, ROUTINE W REFLEX MICROSCOPIC
Bilirubin Urine: NEGATIVE
Glucose, UA: NEGATIVE mg/dL
Ketones, ur: NEGATIVE mg/dL
Nitrite: POSITIVE — AB
Protein, ur: 30 mg/dL — AB
Specific Gravity, Urine: 1.011 (ref 1.005–1.030)
pH: 7 (ref 5.0–8.0)

## 2019-09-14 LAB — BASIC METABOLIC PANEL
Anion gap: 9 (ref 5–15)
BUN: 30 mg/dL — ABNORMAL HIGH (ref 8–23)
CO2: 22 mmol/L (ref 22–32)
Calcium: 9.1 mg/dL (ref 8.9–10.3)
Chloride: 106 mmol/L (ref 98–111)
Creatinine, Ser: 0.78 mg/dL (ref 0.44–1.00)
GFR calc Af Amer: >60 mL/min (ref >60)
GFR calc non Af Amer: >60 mL/min (ref >60)
Glucose, Bld: 138 mg/dL — ABNORMAL HIGH (ref 70–99)
Potassium: 4.1 mmol/L (ref 3.5–5.1)
Sodium: 137 mmol/L (ref 135–145)

## 2019-09-14 LAB — CBC
HCT: 31.9 % — ABNORMAL LOW (ref 36.0–46.0)
HCT: 31.1 % — ABNORMAL LOW (ref 36.0–46.0)
Hemoglobin: 10.4 g/dL — ABNORMAL LOW (ref 12.0–15.0)
Hemoglobin: 10.3 g/dL — ABNORMAL LOW (ref 12.0–15.0)
MCH: 29.9 pg (ref 26.0–34.0)
MCH: 30 pg (ref 26.0–34.0)
MCHC: 32.6 g/dL (ref 30.0–36.0)
MCHC: 33.1 g/dL (ref 30.0–36.0)
MCV: 91.7 fL (ref 80.0–100.0)
MCV: 90.7 fL (ref 80.0–100.0)
Platelets: 336 10*3/uL (ref 150–400)
Platelets: 340 10*3/uL (ref 150–400)
RBC: 3.48 MIL/uL — ABNORMAL LOW (ref 3.87–5.11)
RBC: 3.43 MIL/uL — ABNORMAL LOW (ref 3.87–5.11)
RDW: 13.4 % (ref 11.5–15.5)
RDW: 13.6 % (ref 11.5–15.5)
WBC: 12.6 10*3/uL — ABNORMAL HIGH (ref 4.0–10.5)
WBC: 15.5 10*3/uL — ABNORMAL HIGH (ref 4.0–10.5)
nRBC: 0 % (ref 0.0–0.2)
nRBC: 0 % (ref 0.0–0.2)

## 2019-09-14 LAB — PROTIME-INR
INR: 1 (ref 0.8–1.2)
Prothrombin Time: 13.4 seconds (ref 11.4–15.2)

## 2019-09-14 LAB — APTT: aPTT: 32 seconds (ref 24–36)

## 2019-09-14 LAB — CREATININE, SERUM
Creatinine, Ser: 0.75 mg/dL (ref 0.44–1.00)
GFR calc Af Amer: >60 mL/min (ref >60)
GFR calc non Af Amer: >60 mL/min (ref >60)

## 2019-09-14 LAB — SURGICAL PCR SCREEN
MRSA, PCR: POSITIVE — AB
Staphylococcus aureus: POSITIVE — AB

## 2019-09-14 SURGERY — OPEN REDUCTION INTERNAL FIXATION (ORIF) DISTAL FEMUR FRACTURE
Anesthesia: General | Laterality: Left

## 2019-09-14 MED ORDER — ESCITALOPRAM OXALATE 10 MG PO TABS
10.0000 mg | ORAL_TABLET | Freq: Every day | ORAL | Status: DC
  Administered 2019-09-15 – 2019-09-21 (×7): 10 mg via ORAL
  Filled 2019-09-14 (×7): qty 1

## 2019-09-14 MED ORDER — OXYCODONE HCL 5 MG/5ML PO SOLN: 5.0000 mg | Freq: Once | ORAL | Status: DC | PRN

## 2019-09-14 MED ORDER — CEFAZOLIN SODIUM-DEXTROSE 2-4 GM/100ML-% IV SOLN
2.0000 g | INTRAVENOUS | Status: AC
  Administered 2019-09-14: 15:00:00 2 g via INTRAVENOUS
  Filled 2019-09-14: qty 100

## 2019-09-14 MED ORDER — LORAZEPAM 0.5 MG PO TABS
0.5000 mg | ORAL_TABLET | Freq: Four times a day (QID) | ORAL | Status: DC | PRN
  Administered 2019-09-18 (×2): 0.5 mg via ORAL
  Filled 2019-09-14 (×2): qty 1

## 2019-09-14 MED ORDER — METHIMAZOLE 5 MG PO TABS
5.0000 mg | ORAL_TABLET | Freq: Every day | ORAL | Status: DC
  Administered 2019-09-15 – 2019-09-21 (×7): 5 mg via ORAL
  Filled 2019-09-14 (×9): qty 1

## 2019-09-14 MED ORDER — FENTANYL CITRATE (PF) 100 MCG/2ML IJ SOLN
INTRAMUSCULAR | Status: AC
  Filled 2019-09-14: qty 2

## 2019-09-14 MED ORDER — ROCURONIUM BROMIDE 10 MG/ML (PF) SYRINGE
PREFILLED_SYRINGE | INTRAVENOUS | Status: DC | PRN
  Administered 2019-09-14: 40 mg via INTRAVENOUS

## 2019-09-14 MED ORDER — METOCLOPRAMIDE HCL 5 MG PO TABS: 5.0000 mg | ORAL_TABLET | Freq: Three times a day (TID) | ORAL | Status: DC | PRN

## 2019-09-14 MED ORDER — CEFAZOLIN SODIUM-DEXTROSE 2-4 GM/100ML-% IV SOLN
2.0000 g | Freq: Four times a day (QID) | INTRAVENOUS | Status: AC
  Administered 2019-09-14 – 2019-09-15 (×2): 2 g via INTRAVENOUS
  Filled 2019-09-14 (×2): qty 100

## 2019-09-14 MED ORDER — CHLORHEXIDINE GLUCONATE 4 % EX LIQD: 60.0000 mL | Freq: Once | CUTANEOUS | Status: DC

## 2019-09-14 MED ORDER — ACETAMINOPHEN 650 MG RE SUPP: 650.0000 mg | Freq: Four times a day (QID) | RECTAL | Status: DC | PRN

## 2019-09-14 MED ORDER — SUCCINYLCHOLINE CHLORIDE 200 MG/10ML IV SOSY
PREFILLED_SYRINGE | INTRAVENOUS | Status: DC | PRN
  Administered 2019-09-14: 120 mg via INTRAVENOUS

## 2019-09-14 MED ORDER — SODIUM CHLORIDE 0.9 % IV SOLN
INTRAVENOUS | Status: DC | PRN
  Administered 2019-09-14: 500 mL via INTRAVENOUS

## 2019-09-14 MED ORDER — PROPOFOL 10 MG/ML IV BOLUS
INTRAVENOUS | Status: AC
  Filled 2019-09-14: qty 20

## 2019-09-14 MED ORDER — MENTHOL 3 MG MT LOZG: 1.0000 | LOZENGE | OROMUCOSAL | Status: DC | PRN

## 2019-09-14 MED ORDER — POVIDONE-IODINE 10 % EX SWAB: 2.0000 "application " | Freq: Once | CUTANEOUS | Status: DC

## 2019-09-14 MED ORDER — LATANOPROST 0.005 % OP SOLN
1.0000 [drp] | Freq: Every day | OPHTHALMIC | Status: DC
  Administered 2019-09-15 – 2019-09-20 (×6): 1 [drp] via OPHTHALMIC
  Filled 2019-09-14: qty 2.5

## 2019-09-14 MED ORDER — HYDRALAZINE HCL 25 MG PO TABS: 25.0000 mg | ORAL_TABLET | Freq: Three times a day (TID) | ORAL | Status: DC | PRN

## 2019-09-14 MED ORDER — ACETAMINOPHEN 325 MG PO TABS
650.0000 mg | ORAL_TABLET | Freq: Four times a day (QID) | ORAL | Status: DC | PRN
  Administered 2019-09-17 – 2019-09-21 (×3): 650 mg via ORAL
  Filled 2019-09-14 (×3): qty 2

## 2019-09-14 MED ORDER — GUAIFENESIN 100 MG/5ML PO SOLN: 200.0000 mg | Freq: Four times a day (QID) | ORAL | Status: DC | PRN

## 2019-09-14 MED ORDER — MAGNESIUM HYDROXIDE 400 MG/5ML PO SUSP: 30.0000 mL | Freq: Every evening | ORAL | Status: DC | PRN

## 2019-09-14 MED ORDER — FENTANYL CITRATE (PF) 100 MCG/2ML IJ SOLN: 25.0000 ug | INTRAMUSCULAR | Status: DC | PRN

## 2019-09-14 MED ORDER — ONDANSETRON HCL 4 MG/2ML IJ SOLN
INTRAMUSCULAR | Status: DC | PRN
  Administered 2019-09-14: 4 mg via INTRAVENOUS

## 2019-09-14 MED ORDER — ONDANSETRON HCL 4 MG PO TABS: 4.0000 mg | ORAL_TABLET | Freq: Four times a day (QID) | ORAL | Status: DC | PRN

## 2019-09-14 MED ORDER — HYDRALAZINE HCL 20 MG/ML IJ SOLN
10.0000 mg | Freq: Once | INTRAMUSCULAR | Status: AC
  Administered 2019-09-14: 10 mg via INTRAVENOUS

## 2019-09-14 MED ORDER — PROMETHAZINE HCL 25 MG/ML IJ SOLN: 6.2500 mg | INTRAMUSCULAR | Status: DC | PRN

## 2019-09-14 MED ORDER — FENTANYL CITRATE (PF) 250 MCG/5ML IJ SOLN
INTRAMUSCULAR | Status: DC | PRN
  Administered 2019-09-14 (×2): 25 ug via INTRAVENOUS
  Administered 2019-09-14: 50 ug via INTRAVENOUS

## 2019-09-14 MED ORDER — METOPROLOL SUCCINATE ER 25 MG PO TB24
25.0000 mg | ORAL_TABLET | Freq: Every day | ORAL | Status: DC
  Administered 2019-09-14 – 2019-09-21 (×8): 25 mg via ORAL
  Filled 2019-09-14 (×8): qty 1

## 2019-09-14 MED ORDER — ONDANSETRON HCL 4 MG/2ML IJ SOLN: 4.0000 mg | Freq: Four times a day (QID) | INTRAMUSCULAR | Status: DC | PRN

## 2019-09-14 MED ORDER — ACETAMINOPHEN 10 MG/ML IV SOLN
1000.0000 mg | Freq: Once | INTRAVENOUS | Status: DC | PRN
  Administered 2019-09-14: 1000 mg via INTRAVENOUS

## 2019-09-14 MED ORDER — ACETAMINOPHEN 10 MG/ML IV SOLN
INTRAVENOUS | Status: AC
  Filled 2019-09-14: qty 100

## 2019-09-14 MED ORDER — TRANEXAMIC ACID-NACL 1000-0.7 MG/100ML-% IV SOLN
1000.0000 mg | INTRAVENOUS | Status: AC
  Administered 2019-09-14: 15:00:00 1000 mg via INTRAVENOUS
  Filled 2019-09-14: qty 100

## 2019-09-14 MED ORDER — LOSARTAN POTASSIUM 50 MG PO TABS
100.0000 mg | ORAL_TABLET | Freq: Every day | ORAL | Status: DC
  Administered 2019-09-15 – 2019-09-21 (×7): 100 mg via ORAL
  Filled 2019-09-14 (×7): qty 2

## 2019-09-14 MED ORDER — DEXAMETHASONE SODIUM PHOSPHATE 10 MG/ML IJ SOLN
INTRAMUSCULAR | Status: DC | PRN
  Administered 2019-09-14: 5 mg via INTRAVENOUS

## 2019-09-14 MED ORDER — LIDOCAINE 2% (20 MG/ML) 5 ML SYRINGE
INTRAMUSCULAR | Status: DC | PRN
  Administered 2019-09-14: 60 mg via INTRAVENOUS

## 2019-09-14 MED ORDER — LORAZEPAM 0.5 MG PO TABS
0.5000 mg | ORAL_TABLET | Freq: Two times a day (BID) | ORAL | Status: DC
  Administered 2019-09-15: 0.5 mg via ORAL
  Filled 2019-09-14: qty 1

## 2019-09-14 MED ORDER — DOCUSATE SODIUM 100 MG PO CAPS
100.0000 mg | ORAL_CAPSULE | Freq: Two times a day (BID) | ORAL | Status: DC
  Administered 2019-09-15 – 2019-09-21 (×7): 100 mg via ORAL
  Filled 2019-09-14 (×13): qty 1

## 2019-09-14 MED ORDER — SUGAMMADEX SODIUM 200 MG/2ML IV SOLN
INTRAVENOUS | Status: DC | PRN
  Administered 2019-09-14: 150 mg via INTRAVENOUS

## 2019-09-14 MED ORDER — HYDRALAZINE HCL 20 MG/ML IJ SOLN
INTRAMUSCULAR | Status: AC
  Filled 2019-09-14: qty 1

## 2019-09-14 MED ORDER — LACTATED RINGERS IV SOLN
INTRAVENOUS | Status: DC
  Administered 2019-09-14 (×3): via INTRAVENOUS

## 2019-09-14 MED ORDER — ALUM & MAG HYDROXIDE-SIMETH 200-200-20 MG/5ML PO SUSP: 30.0000 mL | Freq: Four times a day (QID) | ORAL | Status: DC | PRN

## 2019-09-14 MED ORDER — PROPOFOL 10 MG/ML IV BOLUS
INTRAVENOUS | Status: DC | PRN
  Administered 2019-09-14: 50 mg via INTRAVENOUS
  Administered 2019-09-14: 110 mg via INTRAVENOUS

## 2019-09-14 MED ORDER — AMLODIPINE BESYLATE 10 MG PO TABS
10.0000 mg | ORAL_TABLET | Freq: Every day | ORAL | Status: DC
  Administered 2019-09-15 – 2019-09-21 (×7): 10 mg via ORAL
  Filled 2019-09-14 (×7): qty 1

## 2019-09-14 MED ORDER — ENOXAPARIN SODIUM 40 MG/0.4ML ~~LOC~~ SOLN
40.0000 mg | SUBCUTANEOUS | Status: DC
  Administered 2019-09-15 – 2019-09-21 (×7): 40 mg via SUBCUTANEOUS
  Filled 2019-09-14 (×7): qty 0.4

## 2019-09-14 MED ORDER — ONDANSETRON HCL 4 MG/2ML IJ SOLN
INTRAMUSCULAR | Status: AC
  Filled 2019-09-14: qty 2

## 2019-09-14 MED ORDER — METOCLOPRAMIDE HCL 5 MG/ML IJ SOLN: 5.0000 mg | Freq: Three times a day (TID) | INTRAMUSCULAR | Status: DC | PRN

## 2019-09-14 MED ORDER — LIDOCAINE 2% (20 MG/ML) 5 ML SYRINGE
INTRAMUSCULAR | Status: AC
  Filled 2019-09-14: qty 5

## 2019-09-14 MED ORDER — SODIUM CHLORIDE 0.9 % IR SOLN
Status: DC | PRN
  Administered 2019-09-14: 1000 mL

## 2019-09-14 MED ORDER — TRAZODONE HCL 50 MG PO TABS
50.0000 mg | ORAL_TABLET | Freq: Every day | ORAL | Status: DC
  Administered 2019-09-15 – 2019-09-20 (×6): 50 mg via ORAL
  Filled 2019-09-14 (×6): qty 1

## 2019-09-14 MED ORDER — ENSURE PRE-SURGERY PO LIQD
296.0000 mL | Freq: Once | ORAL | Status: DC
  Filled 2019-09-14: qty 296

## 2019-09-14 MED ORDER — LOTEPREDNOL ETABONATE 0.5 % OP SUSP
1.0000 [drp] | Freq: Two times a day (BID) | OPHTHALMIC | Status: DC
  Administered 2019-09-14 – 2019-09-21 (×14): 1 [drp] via OPHTHALMIC
  Filled 2019-09-14: qty 5

## 2019-09-14 MED ORDER — DEXAMETHASONE SODIUM PHOSPHATE 10 MG/ML IJ SOLN
INTRAMUSCULAR | Status: AC
  Filled 2019-09-14: qty 1

## 2019-09-14 MED ORDER — BUSPIRONE HCL 5 MG PO TABS
10.0000 mg | ORAL_TABLET | Freq: Two times a day (BID) | ORAL | Status: DC
  Administered 2019-09-15 – 2019-09-21 (×13): 10 mg via ORAL
  Filled 2019-09-14 (×14): qty 2

## 2019-09-14 MED ORDER — ROCURONIUM BROMIDE 10 MG/ML (PF) SYRINGE
PREFILLED_SYRINGE | INTRAVENOUS | Status: AC
  Filled 2019-09-14: qty 10

## 2019-09-14 MED ORDER — OXYCODONE HCL 5 MG PO TABS: 5.0000 mg | ORAL_TABLET | Freq: Once | ORAL | Status: DC | PRN

## 2019-09-14 MED ORDER — HYDROMORPHONE HCL 1 MG/ML IJ SOLN
INTRAMUSCULAR | Status: AC
  Filled 2019-09-14: qty 1

## 2019-09-14 MED ORDER — PHENOL 1.4 % MT LIQD
1.0000 | OROMUCOSAL | Status: DC | PRN
  Filled 2019-09-14: qty 177

## 2019-09-14 MED ORDER — HYDROMORPHONE HCL 1 MG/ML IJ SOLN
0.2500 mg | INTRAMUSCULAR | Status: DC | PRN
  Administered 2019-09-14: 0.5 mg via INTRAVENOUS

## 2019-09-14 MED ORDER — VITAMIN D 25 MCG (1000 UNIT) PO TABS
1000.0000 [IU] | ORAL_TABLET | Freq: Every day | ORAL | Status: DC
  Administered 2019-09-15 – 2019-09-21 (×7): 1000 [IU] via ORAL
  Filled 2019-09-14 (×8): qty 1

## 2019-09-14 SURGICAL SUPPLY — 78 items
ADH SKN CLS APL DERMABOND .7 (GAUZE/BANDAGES/DRESSINGS) ×3
APL PRP STRL LF DISP 70% ISPRP (MISCELLANEOUS) ×1
BAG SPEC THK2 15X12 ZIP CLS (MISCELLANEOUS) ×1
BAG ZIPLOCK 12X15 (MISCELLANEOUS) ×2 IMPLANT
BIT DRILL 4.3 (BIT) ×4 IMPLANT
BIT DRILL 4.3X300MM (BIT) IMPLANT
BIT DRILL QC 3.3X195 (BIT) ×1 IMPLANT
BNDG COHESIVE 4X5 TAN STRL (GAUZE/BANDAGES/DRESSINGS) ×1 IMPLANT
BNDG GAUZE ELAST 4 BULKY (GAUZE/BANDAGES/DRESSINGS) ×2 IMPLANT
CAP LOCK NCB (Cap) ×6 IMPLANT
CHLORAPREP W/TINT 26 (MISCELLANEOUS) ×2 IMPLANT
COVER SURGICAL LIGHT HANDLE (MISCELLANEOUS) ×2 IMPLANT
COVER WAND RF STERILE (DRAPES) IMPLANT
DERMABOND ADVANCED (GAUZE/BANDAGES/DRESSINGS) ×3
DERMABOND ADVANCED .7 DNX12 (GAUZE/BANDAGES/DRESSINGS) IMPLANT
DRAPE C-ARM 42X120 X-RAY (DRAPES) ×2 IMPLANT
DRAPE C-ARMOR (DRAPES) ×2 IMPLANT
DRAPE HIP W/POCKET STRL (MISCELLANEOUS) ×1 IMPLANT
DRAPE ORTHO SPLIT 77X108 STRL (DRAPES) ×4
DRAPE POUCH INSTRU U-SHP 10X18 (DRAPES) ×2 IMPLANT
DRAPE SHEET LG 3/4 BI-LAMINATE (DRAPES) ×6 IMPLANT
DRAPE SURG 17X11 SM STRL (DRAPES) ×1 IMPLANT
DRAPE SURG ORHT 6 SPLT 77X108 (DRAPES) ×2 IMPLANT
DRAPE U-SHAPE 47X51 STRL (DRAPES) ×1 IMPLANT
DRILL BIT 4.3 (BIT) ×2
DRSG AQUACEL AG ADV 3.5X 4 (GAUZE/BANDAGES/DRESSINGS) ×1 IMPLANT
DRSG AQUACEL AG ADV 3.5X14 (GAUZE/BANDAGES/DRESSINGS) ×1 IMPLANT
DRSG EMULSION OIL 3X16 NADH (GAUZE/BANDAGES/DRESSINGS) ×2 IMPLANT
DRSG TEGADERM 8X12 (GAUZE/BANDAGES/DRESSINGS) ×1 IMPLANT
DS 18 SIZE 7 SURGICAL STEEL 18 INCH ×1 IMPLANT
ELECT REM PT RETURN 15FT ADLT (MISCELLANEOUS) ×2 IMPLANT
FACESHIELD WRAPAROUND (MASK) ×8 IMPLANT
FACESHIELD WRAPAROUND OR TEAM (MASK) IMPLANT
GAUZE SPONGE 4X4 12PLY STRL (GAUZE/BANDAGES/DRESSINGS) ×2 IMPLANT
GLOVE BIO SURGEON STRL SZ8.5 (GLOVE) ×4 IMPLANT
GLOVE BIOGEL M 7.0 STRL (GLOVE) IMPLANT
GLOVE BIOGEL PI IND STRL 7.5 (GLOVE) IMPLANT
GLOVE BIOGEL PI IND STRL 8.5 (GLOVE) ×1 IMPLANT
GLOVE BIOGEL PI INDICATOR 7.5 (GLOVE)
GLOVE BIOGEL PI INDICATOR 8.5 (GLOVE) ×1
GOWN SPEC L3 XXLG W/TWL (GOWN DISPOSABLE) ×4 IMPLANT
GOWN STRL REUS W/TWL LRG LVL3 (GOWN DISPOSABLE) IMPLANT
HOLDER FOLEY CATH W/STRAP (MISCELLANEOUS) ×1 IMPLANT
JET LAVAGE IRRISEPT WOUND (IRRIGATION / IRRIGATOR) ×2
K-WIRE 2.0 (WIRE) ×4
K-WIRE FXSTD 280X2XNS SS (WIRE) ×2
KIT BASIN OR (CUSTOM PROCEDURE TRAY) ×2 IMPLANT
KIT TURNOVER KIT A (KITS) IMPLANT
KWIRE FXSTD 280X2XNS SS (WIRE) IMPLANT
LAVAGE JET IRRISEPT WOUND (IRRIGATION / IRRIGATOR) ×1 IMPLANT
MANIFOLD NEPTUNE II (INSTRUMENTS) ×2 IMPLANT
NS IRRIG 1000ML POUR BTL (IV SOLUTION) ×2 IMPLANT
PACK TOTAL JOINT (CUSTOM PROCEDURE TRAY) ×2 IMPLANT
PLATE NCB 15H HIP (Plate) ×1 IMPLANT
PROTECTOR NERVE ULNAR (MISCELLANEOUS) ×2 IMPLANT
SCREW 5.0 48MM (Screw) ×1 IMPLANT
SCREW 5.0 70MM (Screw) ×1 IMPLANT
SCREW CORTICAL NCB 5.0X65 (Screw) ×1 IMPLANT
SCREW NCB 3.5X75X5X6.2XST (Screw) IMPLANT
SCREW NCB 5.0X34MM (Screw) ×3 IMPLANT
SCREW NCB 5.0X36MM (Screw) ×1 IMPLANT
SCREW NCB 5.0X55MM (Screw) ×1 IMPLANT
SCREW NCB 5.0X75MM (Screw) ×4 IMPLANT
STAPLER VISISTAT 35W (STAPLE) ×2 IMPLANT
STRIP CLOSURE SKIN 1/2X4 (GAUZE/BANDAGES/DRESSINGS) ×2 IMPLANT
SUT MNCRL AB 3-0 PS2 18 (SUTURE) ×2 IMPLANT
SUT MNCRL AB 4-0 PS2 18 (SUTURE) ×1 IMPLANT
SUT MON AB 2-0 CT1 36 (SUTURE) ×2 IMPLANT
SUT STEEL 7 (SUTURE) ×1 IMPLANT
SUT STRATAFIX PDO 1 14 VIOLET (SUTURE) ×2
SUT STRATFX PDO 1 14 VIOLET (SUTURE) ×1
SUT VIC AB 1 CT1 36 (SUTURE) ×4 IMPLANT
SUT VIC AB 2-0 CT1 27 (SUTURE) ×4
SUT VIC AB 2-0 CT1 TAPERPNT 27 (SUTURE) ×2 IMPLANT
SUTURE STRATFX PDO 1 14 VIOLET (SUTURE) ×1 IMPLANT
TOWEL OR 17X26 10 PK STRL BLUE (TOWEL DISPOSABLE) ×4 IMPLANT
TRAY FOLEY MTR SLVR 14FR STAT (SET/KITS/TRAYS/PACK) ×1 IMPLANT
WATER STERILE IRR 1000ML POUR (IV SOLUTION) ×2 IMPLANT

## 2019-09-14 NOTE — Progress Notes (Signed)
Updated patient's daughter Rockie Neighbours.) she would like to speak with the doctor. Her number is [phone]. MD aware.

## 2019-09-14 NOTE — Progress Notes (Signed)
Initial Nutrition Assessment  RD working remotely.   DOCUMENTATION CODES:   Not applicable  INTERVENTION:  - diet advancement as medically feasible. - will monitor for nutrition needs following diet advancement.  - weigh patient today.    NUTRITION DIAGNOSIS:   Increased nutrient needs related to acute illness, post-op healing as evidenced by estimated needs.  GOAL:   Patient will meet greater than or equal to 90% of their needs  MONITOR:   Diet advancement, Labs, Weight trends  REASON FOR ASSESSMENT:   Malnutrition Screening Tool  ASSESSMENT:   85 y.o. female with medical history significant of HTN, hyperlipidemia, hypothyroidism, depression, PVD, Alzheimer's dementia, glaucoma with very poor vision, chronic back pain, and who is under hospice care. She presented to the ED on 9/29 with L upper leg pain after a fall. Daughter stated that patient was agitated and anxious the day PTA. She felt this may be d/t UTI as patient had had similar symptoms with previous UTIs. At baseline, patient recognizes family's voices, but does not recognize family member, sometimes she knows her own name. In the ED, CXR negative, x-ray of LLE showed L femur and L hip/pelvis fractures.  Patient has been NPO since admission. Per flow sheet, patient is disoriented and speech is incomprehensible. Per chart review, patient has not been weighed since 09/30/18 when she weighed 125 lb.   Per notes: - L femur fx s/p fall--Orthopedic surgeon consulted - leukocytosis - Alzheimer's dementia     Labs reviewed; BUN: 30 mg/dl. Medications reviewed; 1000 units vitamin D3/day.    NUTRITION - FOCUSED PHYSICAL EXAM:  unable to complete at this time.   Diet Order:   Diet Order            Diet NPO time specified Except for: Sips with Meds  Diet effective midnight              EDUCATION NEEDS:   No education needs have been identified at this time  Skin:  Skin Assessment: Reviewed RN  Assessment  Last BM:  PTA/unknown  Height:   Ht Readings from Last 1 Encounters:  09/30/18 5\' 6"  (1.676 m)    Weight:   Wt Readings from Last 1 Encounters:  09/30/18 56.7 kg    Ideal Body Weight:  59.1 kg  BMI:  There is no height or weight on file to calculate BMI.  Estimated Nutritional Needs:   Kcal:  1700-1900 kcal  Protein:  75-85 grams  Fluid:  >/= 1.8 L/day      Jarome Matin, MS, RD, LDN, [facility] Inpatient Clinical Dietitian Pager # [phone] After hours/weekend pager # [phone]

## 2019-09-14 NOTE — Op Note (Signed)
OPERATIVE REPORT   09/14/2019  6:14 PM  PATIENT:  Lisa [name]   SURGEON:  Bertram Savin, MD  ASSISTANT:  Staff.   PREOPERATIVE DIAGNOSIS:  Periprosthetic left femur fracture.  POSTOPERATIVE DIAGNOSIS:  Same.  PROCEDURE: Open reduction internal fixation left femur. Interpretation of fluoroscopic images.  ANESTHESIA:   GETA.  ANTIBIOTICS: 2 g Ancef.  IMPLANTS: Zimmer NCB periprosthetic distal femur locking plate. 5.0 mm proximal interlocking screws x4. 5.0 mm distal interlocking screw x6 with locking caps.  SPECIMENS: None.  COMPLICATIONS: None.  DISPOSITION: Stable to PACU.  SURGICAL INDICATIONS:  Lisa [name] is a 85 y.o. female with severe dementia and recent functional decline.  She has had multiple falls.  She had a fall yesterday and sustained a left periprosthetic distal femur fracture.  She has a history of previous left total hip replacement and total knee replacement.  She was admitted to the hospitalist service and underwent perioperative risk ratification medical optimization.  She was indicated for surgical stabilization.  The risks, benefits, and alternatives were discussed with the patient preoperatively including but not limited to the risks of infection, bleeding, nerve / blood vessel injury, malunion, nonunion, cardiopulmonary complications, the need for repeat surgery, among others, and the patient/family were willing to proceed.  PROCEDURE IN DETAIL: The patient was identified in the holding area using 2 identifiers.  The surgical site was marked by myself.  She was taken the operating room and general anesthesia was induced on her bed.  She was then transferred to the operating room table.  She was flipped to the right lateral decubitus position.  She was positioned on a beanbag.  Axillary roll was placed.  All bony prominences were well-padded.  The left thigh was prepped and draped in the normal sterile surgical fashion.  Timeout was called,  verifying site and site of surgery.  She did receive IV antibiotics within 60 minutes of getting the procedure.  I used fluoroscopy to identify her anatomy.  A longitudinal incision was created over the distal third of the femur.  Blunt dissection was performed until I reached the IT band, which I split in line with fibers.  The vastus lateralis was identified and reflected anteriorly off the intermuscular septum.  The fracture was identified.  She had a long spiral fracture that began at the anterior flange of the knee replacement and proceeded up proximally.  The fracture hematoma was irrigated and the fracture site was cleaned with a rongeur.  The fracture was reduced with traction and rotation.  I placed 3 sets of doubled 18-gauge stainless steel wire subperiosteally to provisionally hold the reduction.  I then selected a 15 hole distal femur locking plate.  The plate was slid into place submuscularly.  The plate was provisionally held distally with a K wire, and proximally with a bicortical drill.  I was able to place the drill bit around the tip of the femoral stem.  I then placed a total of 4 proximal interlocking screws.  I placed a total of 6 distal interlocking screws under fluoroscopic control.  Locking caps were placed over the distal screws.  Final AP and lateral fluoroscopy views were obtained of the knee, fracture site, and proximal aspect of the plate.  The wound was copiously irrigated with Irrisept solution and normal saline.  The IT band was closed with #1 Vicryl and #1 strata fix.  Deep dermal layer was closed with 2-0 interrupted Monocryl.  Skin was reapproximated with staples.  Dermabond was applied to  the skin.  Once the glue was fully dried, Aquacel dressing was applied.  The patient was then flipped supine, extubated, taken to the PACU in stable condition.  Sponge, needle, and instrument counts were correct at the end of the case x2.  There were no known complications.  POSTOPERATIVE  PLAN: Postoperatively, the patient be readmitted to the hospitalist.  Touchdown weightbearing left lower extremity.  Begin physical therapy for mobilization out of bed, range of motion of the knee.  Begin Lovenox for DVT prophylaxis.  Patient will undergo disposition planning.  I will need to see her back for 2-week routine postoperative care.

## 2019-09-14 NOTE — Consult Note (Signed)
ORTHOPAEDIC CONSULTATION  REQUESTING PHYSICIAN: Lavina Hamman, MD  PCP:  System, Pcp Not In  Chief Complaint: Left periprosthetic femur fracture.  HPI: Lisa [name] is a 85 y.o. female with a history of hypertension, hyperlipidemia, hypothyroidism, depression, PVD, dementia, glaucoma with poor vision, chronic back pain who complains of left thigh pain and inability to weight-bear.  Patient's family states that she was agitated due to possible UTI.  She had a fall.  She was unable to weight-bear.  The family notes that she has had multiple falls over the past couple of months.  Her status has been declining.  She has actually been under hospice care due to failure to thrive.  She has a history of left total hip replacement and left total knee replacement in [place].  She was admitted to the hospitalist service for perioperative or stratification and medical optimization.  Past Medical History:  Diagnosis Date   Dementia ([place])    Glaucoma    HYPERLIPIDEMIA 11/23/2007   HYPERTENSION 11/23/2007   HYPOTHYROIDISM 11/23/2007   LOW BACK PAIN 11/23/2007   Macular degeneration    OSTEOARTHRITIS 11/23/2007   PERIPHERAL VASCULAR DISEASE 07/11/2008   Past Surgical History:  Procedure Laterality Date   LUMBAR FUSION     TOTAL KNEE ARTHROPLASTY     Social History   Socioeconomic History   Marital status: Married    Spouse name: Not on file   Number of children: 2   Years of education: Not on file   Highest education level: Not on file  Occupational History   Occupation: Retired Scientist, research (physical sciences) strain: Not on file   Food insecurity    Worry: Not on file    Inability: Not on Lexicographer needs    Medical: Not on file    Non-medical: Not on file  Tobacco Use   Smoking status: Never Smoker   Smokeless tobacco: Never Used  Substance and Sexual Activity   Alcohol use: No   Drug use: No   Sexual activity: Not on file    Lifestyle   Physical activity    Days per week: Not on file    Minutes per session: Not on file   Stress: Not on file  Relationships   Social connections    Talks on phone: Not on file    Gets together: Not on file    Attends religious service: Not on file    Active member of club or organization: Not on file    Attends meetings of clubs or organizations: Not on file    Relationship status: Not on file  Other Topics Concern   Not on file  Social History Narrative   Not on file   Family History  Problem Relation Age of Onset   Hyperlipidemia Mother    Diabetes Father    Hypertension Other    Allergies  Allergen Reactions   Aricept [Donepezil Hydrochloride] Other (See Comments)    Syncope NOT ON MAR 07/03/18   Prior to Admission medications   Medication Sig Start Date End Date Taking? Authorizing Provider  acetaminophen (TYLENOL) 500 MG tablet Take 500 mg by mouth every 4 (four) hours as needed for mild pain, fever or headache.   Yes [provider]  alum & mag hydroxide-simeth (MINTOX) [id number] MG/5ML suspension Take 30 mLs by mouth every 6 (six) hours as needed for indigestion or heartburn (Do not exceed 4 doses in 24 hours.).    Yes  [provider]  amLODipine (NORVASC) 10 MG tablet Take 10 mg by mouth daily.   Yes [provider]  busPIRone (BUSPAR) 10 MG tablet Take 10 mg by mouth 2 (two) times daily.   Yes [provider]  Cholecalciferol (VITAMIN D3) 1000 UNITS CAPS Take 1 capsule by mouth daily. Patient taking differently: Take 1,000 Units by mouth daily.  09/16/11  Yes Plotnikov, Evie Lacks, MD  escitalopram (LEXAPRO) 10 MG tablet Take 10 mg by mouth daily.   Yes [provider]  guaifenesin (ROBITUSSIN) 100 MG/5ML syrup Take 200 mg by mouth every 6 (six) hours as needed for cough. Not to exceed 4 doses in 24 hours   Yes [provider]  loperamide (IMODIUM) 2 MG capsule Take 2 mg by mouth as needed for  diarrhea or loose stools (Do not exceed 8 doses in 24 hours.).    Yes [provider]  LORazepam (ATIVAN) 0.5 MG tablet Take 0.5 mg by mouth 2 (two) times daily.   Yes [provider]  LORazepam (ATIVAN) 0.5 MG tablet Take 0.5 mg by mouth every 6 (six) hours as needed for anxiety.   Yes [provider]  losartan (COZAAR) 100 MG tablet TAKE 1 TABLET BY MOUTH EVERY DAY Patient taking differently: Take 100 mg by mouth daily.  09/15/17  Yes Plotnikov, Evie Lacks, MD  Loteprednol Etabonate (LOTEMAX) 0.5 % OINT Place 1 strip into both eyes 2 (two) times daily.   Yes [provider]  magnesium hydroxide (MILK OF MAGNESIA) 400 MG/5ML suspension Take 30 mLs by mouth at bedtime as needed for mild constipation.   Yes [provider]  Menthol, Topical Analgesic, (BIOFREEZE) 4 % GEL Apply 1 application topically 3 (three) times daily as needed (left hip pain).   Yes [provider]  methimazole (TAPAZOLE) 5 MG tablet Take 5 mg by mouth daily.  09/03/18  Yes [provider]  metoprolol succinate (TOPROL-XL) 25 MG 24 hr tablet Take 25 mg by mouth daily. 09/03/18  Yes [provider]  neomycin-bacitracin-polymyxin (NEOSPORIN) ointment Apply 1 application topically as needed for wound care.   Yes [provider]  OVER THE COUNTER MEDICATION Take 1 Can by mouth 4 (four) times daily. House Shake   Yes [provider]  Travoprost, BAK Free, (TRAVATAN Z) 0.004 % SOLN ophthalmic solution Place 2 drops into both eyes daily.    Yes [provider]  traZODone (DESYREL) 50 MG tablet Take 1-2 tablets (50-100 mg total) by mouth at bedtime. Patient taking differently: Take 50 mg by mouth at bedtime.  10/09/17  Yes Plotnikov, Evie Lacks, MD  acetaminophen (TYLENOL) 325 MG tablet Take 2 tablets (650 mg total) by mouth every 6 (six) hours as needed for moderate pain. Patient not taking: Reported on 12/07/2018 06/07/18   Langston Masker B,  PA-C  amLODipine (NORVASC) 5 MG tablet Take 1 tablet (5 mg total) by mouth daily. Patient not taking: Reported on 09/14/2019 08/24/17   Plotnikov, Evie Lacks, MD  cephALEXin (KEFLEX) 500 MG capsule Take 1 capsule (500 mg total) by mouth 2 (two) times daily. Patient not taking: Reported on 09/14/2019 03/31/19   Ezequiel Essex, MD  erythromycin ophthalmic ointment Place a 1/2 inch ribbon of ointment into the lower eyelid 3 times daily for 1 week Patient not taking: Reported on 09/14/2019 03/31/19   Ezequiel Essex, MD   Dg Chest 2 View  Result Date: 09/13/2019 CLINICAL DATA:  Left femur fracture. Fall today. EXAM: CHEST - 2  VIEW COMPARISON:  Radiograph 03/31/2019 FINDINGS: Patient had difficulty with positioning and could not lay flat. Rotated exam. Unchanged heart size and mediastinal contours allowing for positioning. No acute airspace disease, large pleural effusion or pneumothorax. No acute osseous abnormalities are seen. IMPRESSION: Rotated exam without acute abnormality. Electronically Signed   By: Keith Rake M.D.   On: 09/13/2019 21:29   Ct Head Wo Contrast  Result Date: 09/13/2019 CLINICAL DATA:  85 year old female status post unwitnessed fall. Left femur fracture. Altered mental status, combative. EXAM: CT HEAD WITHOUT CONTRAST CT CERVICAL SPINE WITHOUT CONTRAST TECHNIQUE: Multidetector CT imaging of the head and cervical spine was performed following the standard protocol without intravenous contrast. Multiplanar CT image reconstructions of the cervical spine were also generated. COMPARISON:  Head and cervical Spine CT 03/31/2019. FINDINGS: CT HEAD FINDINGS Brain: Stable cerebral volume. Stable gray-white matter differentiation throughout the brain. Mild to moderate for age patchy white matter hypodensity. No midline shift, ventriculomegaly, mass effect, evidence of mass lesion, intracranial hemorrhage or evidence of cortically based acute infarction. No cortical encephalomalacia. Vascular:  Mild Calcified atherosclerosis at the skull base. No suspicious intracranial vascular hyperdensity. Skull: Stable and intact. Sinuses/Orbits: Visualized paranasal sinuses and mastoids are stable and well pneumatized. Other: No acute orbit or scalp soft tissue finding. CT CERVICAL SPINE FINDINGS Motion artifact, most pronounced at C1-C2. Alignment: Stable since April, mild degenerative appearing anterolisthesis at C4-C5 and C7-T1. Bilateral posterior element alignment is within normal limits. Skull base and vertebrae: Motion artifact at the skull base, C1 and C2. Chronic degenerative changes there. No acute osseous abnormality identified. Soft tissues and spinal canal: No prevertebral fluid or swelling. No visible canal hematoma. Disc levels: Stable. Chronic severe C1-C2 degeneration. Widespread cervical facet arthropathy. Chronic severe C6-C7 disc and endplate degeneration. Upper chest: Visible upper thoracic levels appear intact. Negative lung apices. IMPRESSION: 1. No acute traumatic injury identified in the head or cervical spine. Detail of C1 and C2 degraded by motion. 2. Stable non contrast CT appearance of the brain since April. Mild to moderate white matter changes most commonly due to small vessel disease. 3. Chronically advanced cervical spine degeneration. Electronically Signed   By: Genevie Ann M.D.   On: 09/13/2019 23:52   Ct Cervical Spine Wo Contrast  Result Date: 09/13/2019 CLINICAL DATA:  85 year old female status post unwitnessed fall. Left femur fracture. Altered mental status, combative. EXAM: CT HEAD WITHOUT CONTRAST CT CERVICAL SPINE WITHOUT CONTRAST TECHNIQUE: Multidetector CT imaging of the head and cervical spine was performed following the standard protocol without intravenous contrast. Multiplanar CT image reconstructions of the cervical spine were also generated. COMPARISON:  Head and cervical Spine CT 03/31/2019. FINDINGS: CT HEAD FINDINGS Brain: Stable cerebral volume. Stable gray-white  matter differentiation throughout the brain. Mild to moderate for age patchy white matter hypodensity. No midline shift, ventriculomegaly, mass effect, evidence of mass lesion, intracranial hemorrhage or evidence of cortically based acute infarction. No cortical encephalomalacia. Vascular: Mild Calcified atherosclerosis at the skull base. No suspicious intracranial vascular hyperdensity. Skull: Stable and intact. Sinuses/Orbits: Visualized paranasal sinuses and mastoids are stable and well pneumatized. Other: No acute orbit or scalp soft tissue finding. CT CERVICAL SPINE FINDINGS Motion artifact, most pronounced at C1-C2. Alignment: Stable since April, mild degenerative appearing anterolisthesis at C4-C5 and C7-T1. Bilateral posterior element alignment is within normal limits. Skull base and vertebrae: Motion artifact at the skull base, C1 and C2. Chronic degenerative changes there. No acute osseous abnormality identified. Soft tissues and spinal canal: No prevertebral fluid or  swelling. No visible canal hematoma. Disc levels: Stable. Chronic severe C1-C2 degeneration. Widespread cervical facet arthropathy. Chronic severe C6-C7 disc and endplate degeneration. Upper chest: Visible upper thoracic levels appear intact. Negative lung apices. IMPRESSION: 1. No acute traumatic injury identified in the head or cervical spine. Detail of C1 and C2 degraded by motion. 2. Stable non contrast CT appearance of the brain since April. Mild to moderate white matter changes most commonly due to small vessel disease. 3. Chronically advanced cervical spine degeneration. Electronically Signed   By: Genevie Ann M.D.   On: 09/13/2019 23:52   Dg Hip Unilat With Pelvis 2-3 Views Left  Result Date: 09/13/2019 CLINICAL DATA:  Fall at care facility earlier today. Left hip/femur pain. Deformity. EXAM: DG HIP (WITH OR WITHOUT PELVIS) 2-3V LEFT COMPARISON:  Concurrent femur radiograph. FINDINGS: Patient had difficulty with positioning, with  combative, and could not lie flat. Left hip arthroplasty is intact. No periprosthetic lucency or fracture. Mid-distal femoral shaft fracture better visualized on concurrent femur exam. IMPRESSION: 1. Intact left hip arthroplasty. No periprosthetic fracture. 2. Mid-distal femoral shaft fracture better visualized on concurrent femur exam. 3. Limited evaluation due to patient medical condition. Electronically Signed   By: Keith Rake M.D.   On: 09/13/2019 21:27   Dg Femur Min 2 Views Left  Result Date: 09/13/2019 CLINICAL DATA:  85 year old female status post fall today. Combative. EXAM: LEFT FEMUR 2 VIEWS COMPARISON:  Left hip series reported separately. FINDINGS: Left hip details are reported separately. Superimposed left total knee arthroplasty. Long segment spiral fracture of the distal left femoral shaft terminating about 4 centimeters proximal to the distal femoral hardware. 1/2 shaft width medial displacement, posterior displacement, as well as over riding and posterior angulation. Grossly aligned total knee arthroplasty components. IMPRESSION: 1. Long segment spiral fracture of the distal left femoral shaft with 1/2 shaft width displacement, over-riding, and posterior angulation. 2. Superimposed left total knee arthroplasty appears intact. 3. Left hip details reported separately. Electronically Signed   By: Genevie Ann M.D.   On: 09/13/2019 21:30    Positive ROS: All other systems have been reviewed and were otherwise negative with the exception of those mentioned in the HPI and as above.  Physical Exam: General: Alert, agitated Cardiovascular: No pedal edema Respiratory: No cyanosis, no use of accessory musculature GI: No organomegaly, abdomen is soft and non-tender Skin: No lesions in the area of chief complaint Neurologic: Sensation intact distally Psychiatric: Patient is competent for consent with normal mood and affect Lymphatic: No axillary or cervical lymphadenopathy  MUSCULOSKELETAL:  Examination of the left lower extremity reveals healed incisions to the left hip and knee. Obvious deformity to thigh. Foot perfused. Unable to obtain motor sensory exam due to agitation.   Assessment: Periprosthetic left femur fracture  Plan: I discussed the findings with the patient's family over the phone.  This is an unfortunate situation.  Patient's functional status has been declining due to dementia and she has had multiple falls.  She has a displaced left femur fracture located in between her total hip and total knee replacements.  The implants are not involved.  The injury will require surgical stabilization.  We discussed the risk, benefits, and alternatives to ORIF of the left femur.  The patient's family did inquire about nonoperative treatment.  We discussed that nonoperative treatment of this type of injury is generally reserved for completely bedbound patients or for patients with terrible cardiopulmonary function that would not likely survive the procedure.  The patient does  not fit either of these situations.  Therefore, I think the benefits of surgery outweigh the risks.  We discussed that nonoperative treatment would lead to her certain demise due to the risk of immobility.  We discussed that surgical stabilization will improve her pain and activities of daily living such as toileting, feeding, and dressing.  They agree to proceed with surgery.  All questions were solicited and answered.  The risks, benefits, and alternatives were discussed with the patient. There are risks associated with the surgery including, but not limited to, problems with anesthesia (death), infection, differences in leg length/angulation/rotation, fracture of bones, loosening or failure of implants, malunion, nonunion, hematoma (blood accumulation) which may require surgical drainage, blood clots, pulmonary embolism, nerve injury (foot drop), and blood vessel injury. The patient understands these risks and elects  to proceed.    Bertram Savin, MD Cell [phone]    09/14/2019 11:49 AM

## 2019-09-14 NOTE — Anesthesia Procedure Notes (Signed)
Procedure Name: Intubation Date/Time: 09/14/2019 2:53 PM Performed by: Talbot Grumbling, CRNA Pre-anesthesia Checklist: Patient identified, Emergency Drugs available, Suction available and Patient being monitored Patient Re-evaluated:Patient Re-evaluated prior to induction Oxygen Delivery Method: Circle system utilized Preoxygenation: Pre-oxygenation with 100% oxygen Induction Type: IV induction Ventilation: Mask ventilation without difficulty Laryngoscope Size: 3 and Mac Grade View: Grade I Tube type: Oral Tube size: 7.0 mm Number of attempts: 1 Airway Equipment and Method: Stylet Placement Confirmation: ETT inserted through vocal cords under direct vision,  positive ETCO2 and breath sounds checked- equal and bilateral Secured at: 21 cm Tube secured with: Tape Dental Injury: Teeth and Oropharynx as per pre-operative assessment

## 2019-09-14 NOTE — Transfer of Care (Signed)
Immediate Anesthesia Transfer of Care Note  Patient: Lisa [name]  Procedure(s) Performed: OPEN REDUCTION INTERNAL FIXATION (ORIF) DISTAL FEMUR FRACTURE (Left )  Patient Location: PACU  Anesthesia Type:General  Level of Consciousness: sedated and responds to stimulation  Airway & Oxygen Therapy: Patient Spontanous Breathing and Patient connected to face mask oxygen  Post-op Assessment: Report given to RN and Post -op Vital signs reviewed and stable  Post vital signs: Reviewed and stable  Last Vitals:  Vitals Value Taken Time  BP 162/66 09/14/19 1825  Temp    Pulse 74 09/14/19 1828  Resp 17 09/14/19 1828  SpO2 100 % 09/14/19 1828  Vitals shown include unvalidated device data.  Last Pain:  Vitals:   09/14/19 1346  TempSrc: Axillary  PainSc:          Complications: No apparent anesthesia complications

## 2019-09-14 NOTE — Discharge Instructions (Signed)
°Dr. Oakes Mccready °Adult Hip & Knee Specialist °[facility] °[address] °[place],  [address] °[phone] ° ° °POSTOPERATIVE DIRECTIONS ° ° ° °Hip Rehabilitation, Guidelines Following Surgery  ° °WEIGHT BEARING °Partial weight bearing with assist device as directed.  touch down weight bearing left leg ° ° °HOME CARE INSTRUCTIONS  °Remove items at home which could result in a fall. This includes throw rugs or furniture in walking pathways.  °Continue medications as instructed at time of discharge. °· You may have some home medications which will be placed on hold until you complete the course of blood thinner medication. °· 4 days after discharge, you may start showering. No tub baths or soaking your incisions. °Do not put on socks or shoes without following the instructions of your caregivers.   °Sit on chairs with arms. Use the chair arms to help push yourself up when arising.  °Arrange for the use of a toilet seat elevator so you are not sitting low.  °· Walk with walker as instructed.  °You may resume a sexual relationship in one month or when given the OK by your caregiver.  °Use walker as long as suggested by your caregivers.  °Avoid periods of inactivity such as sitting longer than an hour when not asleep. This helps prevent blood clots.  °You may return to work once you are cleared by your surgeon.  °Do not drive a car for 6 weeks or until released by your surgeon.  °Do not drive while taking narcotics.  °Wear elastic stockings for two weeks following surgery during the day but you may remove then at night.  °Make sure you keep all of your appointments after your operation with all of your doctors and caregivers. You should call the office at the above phone number and make an appointment for approximately two weeks after the date of your surgery. °Please pick up a stool softener and laxative for home use as long as you are requiring pain medications. °· ICE to the  affected hip every three hours for 30 minutes at a time and then as needed for pain and swelling. Continue to use ice on the hip for pain and swelling from surgery. You may notice swelling that will progress down to the foot and ankle.  This is normal after surgery.  Elevate the leg when you are not up walking on it.   °It is important for you to complete the blood thinner medication as prescribed by your doctor. °· Continue to use the breathing machine which will help keep your temperature down.  It is common for your temperature to cycle up and down following surgery, especially at night when you are not up moving around and exerting yourself.  The breathing machine keeps your lungs expanded and your temperature down. ° °RANGE OF MOTION AND STRENGTHENING EXERCISES  °These exercises are designed to help you keep full movement of your hip joint. Follow your caregiver's or physical therapist's instructions. Perform all exercises about fifteen times, three times per day or as directed. Exercise both hips, even if you have had only one joint replacement. These exercises can be done on a training (exercise) mat, on the floor, on a table or on a bed. Use whatever works the best and is most comfortable for you. Use music or television while you are exercising so that the exercises are a pleasant break in your day. This will make your life better with the exercises acting as a break in routine   you can look forward to.  °Lying on your back, slowly slide your foot toward your buttocks, raising your knee up off the floor. Then slowly slide your foot back down until your leg is straight again.  °Lying on your back spread your legs as far apart as you can without causing discomfort.  °Lying on your side, raise your upper leg and foot straight up from the floor as far as is comfortable. Slowly lower the leg and repeat.  °Lying on your back, tighten up the muscle in the front of your thigh (quadriceps muscles). You can do this by  keeping your leg straight and trying to raise your heel off the floor. This helps strengthen the largest muscle supporting your knee.  °Lying on your back, tighten up the muscles of your buttocks both with the legs straight and with the knee bent at a comfortable angle while keeping your heel on the floor.  ° °SKILLED REHAB INSTRUCTIONS: °If the patient is transferred to a skilled rehab facility following release from the hospital, a list of the current medications will be sent to the facility for the patient to continue.  When discharged from the skilled rehab facility, please have the facility set up the patient's Home Health Physical Therapy prior to being released. Also, the skilled facility will be responsible for providing the patient with their medications at time of release from the facility to include their pain medication and their blood thinner medication. If the patient is still at the rehab facility at time of the two week follow up appointment, the skilled rehab facility will also need to assist the patient in arranging follow up appointment in our office and any transportation needs. ° °MAKE SURE YOU:  °Understand these instructions.  °Will watch your condition.  °Will get help right away if you are not doing well or get worse. ° °Pick up stool softner and laxative for home use following surgery while on pain medications. °Daily dry dressing changes as needed. °In 4 days, you may remove your dressings and begin taking showers - no tub baths or soaking the incisions. °Continue to use ice for pain and swelling after surgery. °Do not use any lotions or creams on the incision until instructed by your surgeon. ° ° °

## 2019-09-14 NOTE — ED Notes (Signed)
ED TO INPATIENT HANDOFF REPORT  ED Nurse Name and Phone #:  Leafy Kindle  [id number] S Name/Age/Gender Lisa [name] 85 y.o. female Room/Bed: WA09/WA09  Code Status   Code Status: Not on file  Home/SNF/Other Nursing Home Patient oriented to: self Is this baseline? Yes   Triage Complete: Triage complete  Chief Complaint Fall; Hip Pain  Triage Note Pt arrives via EMS from [facility], is a hospice patient. Pt was reportedly anxious all day, unwitnessed Fall, L hip is internally rotated, faint pedal pulse felt by EMS. No LOC. Pt in c-collar upon arrival. Pt keeps grabbing at area above left knee/ thigh area. Baseline alert and oriented to name, at baseline. 100 mch Fentanyl given by EMS.    Allergies Allergies  Allergen Reactions  . Aricept [Donepezil Hydrochloride] Other (See Comments)    Syncope NOT ON MAR 07/03/18    Level of Care/Admitting Diagnosis ED Disposition    ED Disposition Condition [facility] Area: [place] [[id number]]  Level of Care: Med-Surg [16]  Covid Evaluation: Asymptomatic Screening Protocol (No Symptoms)  Diagnosis: Fract[facility] of femoral shaft, left[id number]  Admitting Physician: Ivor Costa [[id number]]  Attending Physician: Ivor Costa [phone]  Estimated length of stay: past midnight tomorrow  Certification:: I certify this patient will need inpatient services for at least 2 midnights  PT Class (Do Not Modify): Inpatient [101]  PT Acc Code (Do Not Modify): Private [1]       B Medical/Surgery History Past Medical History:  Diagnosis Date  . Dementia ([place])   . Glaucoma   . HYPERLIPIDEMIA 11/23/2007  . HYPERTENSION 11/23/2007  . HYPOTHYROIDISM 11/23/2007  . LOW BACK PAIN 11/23/2007  . Macular degeneration   . OSTEOARTHRITIS 11/23/2007  . PERIPHERAL VASCULAR DISEASE 07/11/2008   Past Surgical History:  Procedure Laterality Date  . LUMBAR FUSION    . TOTAL KNEE ARTHROPLASTY       A IV  Location/Drains/Wounds Patient Lines/Drains/Airways Status   Active Line/Drains/Airways    Name:   Placement date:   Placement time:   Site:   Days:   Peripheral IV 09/13/19 Left Forearm   09/13/19    2028    Forearm   1   External Urinary Catheter   01/17/19    0130    -   240          Intake/Output Last 24 hours No intake or output data in the 24 hours ending 09/14/19 0032  Labs/Imaging Results for orders placed or performed during the hospital encounter of 09/13/19 (from the past 48 hour(s))  CBC with Differential     Status: Abnormal   Collection Time: 09/13/19 10:28 PM  Result Value Ref Range   WBC 11.0 (H) 4.0 - 10.5 K/uL   RBC 3.73 (L) 3.87 - 5.11 MIL/uL   Hemoglobin 11.1 (L) 12.0 - 15.0 g/dL   HCT 34.2 (L) 36.0 - 46.0 %   MCV 91.7 80.0 - 100.0 fL   MCH 29.8 26.0 - 34.0 pg   MCHC 32.5 30.0 - 36.0 g/dL   RDW 13.6 11.5 - 15.5 %   Platelets 315 150 - 400 K/uL   nRBC 0.0 0.0 - 0.2 %   Neutrophils Relative % 80 %   Neutro Abs 8.7 (H) 1.7 - 7.7 K/uL   Lymphocytes Relative 12 %   Lymphs Abs 1.3 0.7 - 4.0 K/uL   Monocytes Relative 8 %   Monocytes Absolute 0.9 0.1 - 1.0 K/uL  Eosinophils Relative 0 %   Eosinophils Absolute 0.0 0.0 - 0.5 K/uL   Basophils Relative 0 %   Basophils Absolute 0.0 0.0 - 0.1 K/uL   Immature Granulocytes 0 %   Abs Immature Granulocytes 0.03 0.00 - 0.07 K/uL    Comment: Performed at [facility], [place] [address] [place], [place] [id number]  Basic metabolic panel     Status: Abnormal   Collection Time: 09/13/19 10:28 PM  Result Value Ref Range   Sodium 140 135 - 145 mmol/L   Potassium 4.4 3.5 - 5.1 mmol/L   Chloride 105 98 - 111 mmol/L   CO2 24 22 - 32 mmol/L   Glucose, Bld 131 (H) 70 - 99 mg/dL   BUN 33 (H) 8 - 23 mg/dL   Creatinine, Ser 0.89 0.44 - 1.00 mg/dL   Calcium 9.4 8.9 - 10.3 mg/dL   GFR calc non Af Amer 57 (L) >60 mL/min   GFR calc Af Amer >60 >60 mL/min   Anion gap 11 5 - 15    Comment: Performed at [facility], [place] [address] [place], [place] [address]  SARS Coronavirus 2 [facility] order, Performed in [facility] hospital lab) Nasopharyngeal Nasopharyngeal Swab     Status: None   Collection Time: 09/13/19 10:32 PM   Specimen: Nasopharyngeal Swab  Result Value Ref Range   SARS Coronavirus 2 NEGATIVE NEGATIVE    Comment: (NOTE) If result is NEGATIVE SARS-CoV-2 target nucleic acids are NOT DETECTED. The SARS-CoV-2 RNA is generally detectable in upper and lower  respiratory specimens during the acute phase of infection. The lowest  concentration of SARS-CoV-2 viral copies this assay can detect is 250  copies / mL. A negative result does not preclude SARS-CoV-2 infection  and should not be used as the sole basis for treatment or other  patient management decisions.  A negative result may occur with  improper specimen collection / handling, submission of specimen other  than nasopharyngeal swab, presence of viral mutation(s) within the  areas targeted by this assay, and inadequate number of viral copies  (<250 copies / mL). A negative result must be combined with clinical  observations, patient history, and epidemiological information. If result is POSITIVE SARS-CoV-2 target nucleic acids are DETECTED. The SARS-CoV-2 RNA is generally detectable in upper and lower  respiratory specimens dur ing the acute phase of infection.  Positive  results are indicative of active infection with SARS-CoV-2.  Clinical  correlation with patient history and other diagnostic information is  necessary to determine patient infection status.  Positive results do  not rule out bacterial infection or co-infection with other viruses. If result is PRESUMPTIVE POSTIVE SARS-CoV-2 nucleic acids MAY BE PRESENT.   A presumptive positive result was obtained on the submitted specimen  and confirmed on repeat testing.  While 2019 novel coronavirus  (SARS-CoV-2) nucleic acids may be present in the  submitted sample  additional confirmatory testing may be necessary for epidemiological  and / or clinical management purposes  to differentiate between  SARS-CoV-2 and other Sarbecovirus currently known to infect humans.  If clinically indicated additional testing with an alternate test  methodology [phone]) is advised. The SARS-CoV-2 RNA is generally  detectable in upper and lower respiratory sp ecimens during the acute  phase of infection. The expected result is Negative. Fact Sheet for Patients:  StrictlyIdeas.no Fact Sheet for Healthcare Providers: BankingDealers.co.za This test is not yet approved or cleared by the [place] FDA and has been  authorized for detection and/or diagnosis of SARS-CoV-2 by FDA under an Emergency Use Authorization (EUA).  This EUA will remain in effect (meaning this test can be used) for the duration of the COVID-19 declaration under Section 564(b)(1) of the Act, 21 U.S.C. section 360bbb-3(b)(1), unless the authorization is terminated or revoked sooner. Performed at [facility], [place] [address] [place], [place] [address]    Dg Chest 2 View  Result Date: 09/13/2019 CLINICAL DATA:  Left femur fracture. Fall today. EXAM: CHEST - 2 VIEW COMPARISON:  Radiograph 03/31/2019 FINDINGS: Patient had difficulty with positioning and could not lay flat. Rotated exam. Unchanged heart size and mediastinal contours allowing for positioning. No acute airspace disease, large pleural effusion or pneumothorax. No acute osseous abnormalities are seen. IMPRESSION: Rotated exam without acute abnormality. Electronically Signed   By: Keith Rake M.D.   On: 09/13/2019 21:29   Ct Head Wo Contrast  Result Date: 09/13/2019 CLINICAL DATA:  85 year old female status post unwitnessed fall. Left femur fracture. Altered mental status, combative. EXAM: CT HEAD WITHOUT CONTRAST CT CERVICAL SPINE WITHOUT CONTRAST  TECHNIQUE: Multidetector CT imaging of the head and cervical spine was performed following the standard protocol without intravenous contrast. Multiplanar CT image reconstructions of the cervical spine were also generated. COMPARISON:  Head and cervical Spine CT 03/31/2019. FINDINGS: CT HEAD FINDINGS Brain: Stable cerebral volume. Stable gray-white matter differentiation throughout the brain. Mild to moderate for age patchy white matter hypodensity. No midline shift, ventriculomegaly, mass effect, evidence of mass lesion, intracranial hemorrhage or evidence of cortically based acute infarction. No cortical encephalomalacia. Vascular: Mild Calcified atherosclerosis at the skull base. No suspicious intracranial vascular hyperdensity. Skull: Stable and intact. Sinuses/Orbits: Visualized paranasal sinuses and mastoids are stable and well pneumatized. Other: No acute orbit or scalp soft tissue finding. CT CERVICAL SPINE FINDINGS Motion artifact, most pronounced at C1-C2. Alignment: Stable since April, mild degenerative appearing anterolisthesis at C4-C5 and C7-T1. Bilateral posterior element alignment is within normal limits. Skull base and vertebrae: Motion artifact at the skull base, C1 and C2. Chronic degenerative changes there. No acute osseous abnormality identified. Soft tissues and spinal canal: No prevertebral fluid or swelling. No visible canal hematoma. Disc levels: Stable. Chronic severe C1-C2 degeneration. Widespread cervical facet arthropathy. Chronic severe C6-C7 disc and endplate degeneration. Upper chest: Visible upper thoracic levels appear intact. Negative lung apices. IMPRESSION: 1. No acute traumatic injury identified in the head or cervical spine. Detail of C1 and C2 degraded by motion. 2. Stable non contrast CT appearance of the brain since April. Mild to moderate white matter changes most commonly due to small vessel disease. 3. Chronically advanced cervical spine degeneration. Electronically  Signed   By: Genevie Ann M.D.   On: 09/13/2019 23:52   Ct Cervical Spine Wo Contrast  Result Date: 09/13/2019 CLINICAL DATA:  85 year old female status post unwitnessed fall. Left femur fracture. Altered mental status, combative. EXAM: CT HEAD WITHOUT CONTRAST CT CERVICAL SPINE WITHOUT CONTRAST TECHNIQUE: Multidetector CT imaging of the head and cervical spine was performed following the standard protocol without intravenous contrast. Multiplanar CT image reconstructions of the cervical spine were also generated. COMPARISON:  Head and cervical Spine CT 03/31/2019. FINDINGS: CT HEAD FINDINGS Brain: Stable cerebral volume. Stable gray-white matter differentiation throughout the brain. Mild to moderate for age patchy white matter hypodensity. No midline shift, ventriculomegaly, mass effect, evidence of mass lesion, intracranial hemorrhage or evidence of cortically based acute infarction. No cortical encephalomalacia. Vascular: Mild Calcified atherosclerosis at the skull base. No  suspicious intracranial vascular hyperdensity. Skull: Stable and intact. Sinuses/Orbits: Visualized paranasal sinuses and mastoids are stable and well pneumatized. Other: No acute orbit or scalp soft tissue finding. CT CERVICAL SPINE FINDINGS Motion artifact, most pronounced at C1-C2. Alignment: Stable since April, mild degenerative appearing anterolisthesis at C4-C5 and C7-T1. Bilateral posterior element alignment is within normal limits. Skull base and vertebrae: Motion artifact at the skull base, C1 and C2. Chronic degenerative changes there. No acute osseous abnormality identified. Soft tissues and spinal canal: No prevertebral fluid or swelling. No visible canal hematoma. Disc levels: Stable. Chronic severe C1-C2 degeneration. Widespread cervical facet arthropathy. Chronic severe C6-C7 disc and endplate degeneration. Upper chest: Visible upper thoracic levels appear intact. Negative lung apices. IMPRESSION: 1. No acute traumatic injury  identified in the head or cervical spine. Detail of C1 and C2 degraded by motion. 2. Stable non contrast CT appearance of the brain since April. Mild to moderate white matter changes most commonly due to small vessel disease. 3. Chronically advanced cervical spine degeneration. Electronically Signed   By: Genevie Ann M.D.   On: 09/13/2019 23:52   Dg Hip Unilat With Pelvis 2-3 Views Left  Result Date: 09/13/2019 CLINICAL DATA:  Fall at care facility earlier today. Left hip/femur pain. Deformity. EXAM: DG HIP (WITH OR WITHOUT PELVIS) 2-3V LEFT COMPARISON:  Concurrent femur radiograph. FINDINGS: Patient had difficulty with positioning, with combative, and could not lie flat. Left hip arthroplasty is intact. No periprosthetic lucency or fracture. Mid-distal femoral shaft fracture better visualized on concurrent femur exam. IMPRESSION: 1. Intact left hip arthroplasty. No periprosthetic fracture. 2. Mid-distal femoral shaft fracture better visualized on concurrent femur exam. 3. Limited evaluation due to patient medical condition. Electronically Signed   By: Keith Rake M.D.   On: 09/13/2019 21:27   Dg Femur Min 2 Views Left  Result Date: 09/13/2019 CLINICAL DATA:  85 year old female status post fall today. Combative. EXAM: LEFT FEMUR 2 VIEWS COMPARISON:  Left hip series reported separately. FINDINGS: Left hip details are reported separately. Superimposed left total knee arthroplasty. Long segment spiral fracture of the distal left femoral shaft terminating about 4 centimeters proximal to the distal femoral hardware. 1/2 shaft width medial displacement, posterior displacement, as well as over riding and posterior angulation. Grossly aligned total knee arthroplasty components. IMPRESSION: 1. Long segment spiral fracture of the distal left femoral shaft with 1/2 shaft width displacement, over-riding, and posterior angulation. 2. Superimposed left total knee arthroplasty appears intact. 3. Left hip details reported  separately. Electronically Signed   By: Genevie Ann M.D.   On: 09/13/2019 21:30    Pending Labs [employer] (From admission, onward)    Start     Ordered   Signed and Held  Protime-INR  Once,   R     Signed and Held   Signed and Held  APTT  Once,   R     Signed and Held   Signed and Held  Type and screen [employer]  Once,   R    Comments: [employer]    Signed and Held   Signed and Held  Urinalysis, Routine w reflex microscopic  Once,   R     Signed and Held   Visual merchandiser and Occupational hygienist morning,   R     Signed and Held   Signed and Held  CBC  Tomorrow morning,   R     Signed and Held  Vitals/Pain Today's Vitals   09/13/19 2300 09/13/19 2343 09/13/19 2344 09/14/19 0000  BP: 127/77  (!) 142/94 (!) 151/58  Pulse: 96  99 88  Resp: (!) 24  (!) 22 (!) 31  Temp:      TempSrc:      SpO2: 100%  98% 99%  PainSc:  10-Worst pain ever      Isolation Precautions No active isolations  Medications Medications  oxyCODONE-acetaminophen (PERCOCET/ROXICET) 5-325 MG per tablet 1 tablet (has no administration in time range)  morphine 2 MG/ML injection 0.5 mg (has no administration in time range)  methocarbamol (ROBAXIN) tablet 500 mg (has no administration in time range)  fentaNYL (SUBLIMAZE) injection 25 mcg (25 mcg Intravenous Given 09/13/19 2228)  fentaNYL (SUBLIMAZE) injection 50 mcg (50 mcg Intravenous Given 09/13/19 2343)    Mobility walks with device      R Recommendations: See Admitting Provider Note  Report given to: Pam 3W

## 2019-09-14 NOTE — Progress Notes (Signed)
[facility] Progress Note  Patient: Lisa [name] [id number]   PCP: System, Pcp Not In DOB: 1928/11/10   DOA: 09/13/2019   DOS: 09/14/2019   Date of Service: the patient was seen and examined on 09/14/2019  Chief Complaint  Patient presents with   Hip Pain   Fall   Brief hospital course: Pt. with PMH of HTN, HLD, hypothyroidism, depression, PVD, dementia, under active hospice, glaucoma; presented with complain of fall, was found to have left distal femur shaft fracture.  Currently further plan is undergoing surgical repair per discussion between patient's family and orthopedics.  Subjective: Patient pleasantly confused.  Unable to answer questions appropriately.  No acute events overnight.  No nausea no vomiting.  Assessment and Plan: Closed fracture of shaft of left femur ([place]):  As evidenced by x-ray. Patient has severe pain now.  Orthopedic surgeon, Dr. Lyla Glassing was consulted.  Dr. Lyla Glassing consulted with patient's daughter who is the POA and family has decided to go with the surgery for now. While the patient's perioperative myocardial infarction or cardiac arrest risk is 0.7% which is moderate and not prohibitive patient is at significant risk for poor surgical outcome secondary to her dementia. Monitor closely for postoperative delirium.  Leukocytosis: Likely due to stress-induced demargination. Patient does not have signs of infection. -Follow-up cultures  Essential hypertension: -Losartan, metoprolol, amlodipine -As needed hydralazine orally  Alzheimer disease ([place]): has agitation and anxiety today. -f/u UA  -continue home ativan  Depression with anxiety -Lexapro and ativan  Fall: -pt/ot when able to   Hyperthyroidism: -Continue methimazole  Diet: NPO pending surgery DVT Prophylaxis: SCD, pharmacological prophylaxis contraindicated due to Pending surgery  Advance goals of care discussion: DNR  Family Communication: no family was present at  bedside, at the time of interview.   Disposition:  Discharge to be determined .  Consultants: Orthopedic Dr. Lyla Glassing Procedures: none  Scheduled Meds:  [MAR Hold] amLODipine  10 mg Oral Daily   [MAR Hold] busPIRone  10 mg Oral BID   chlorhexidine  60 mL Topical Once   [MAR Hold] cholecalciferol  1,000 Units Oral Daily   [MAR Hold] escitalopram  10 mg Oral Daily   feeding supplement  296 mL Oral Once   [MAR Hold] latanoprost  1 drop Both Eyes QHS   [MAR Hold] LORazepam  0.5 mg Oral BID   [MAR Hold] losartan  100 mg Oral Daily   [MAR Hold] loteprednol  1 drop Both Eyes BID   [MAR Hold] methimazole  5 mg Oral Daily   [MAR Hold] metoprolol succinate  25 mg Oral Daily   povidone-iodine  2 application Topical Once   [MAR Hold] traZODone  50 mg Oral QHS   Continuous Infusions:  lactated ringers 10 mL/hr at 09/14/19 1431   PRN Meds: [MAR Hold] acetaminophen **OR** [MAR Hold] acetaminophen, [MAR Hold] alum & mag hydroxide-simeth, [MAR Hold] guaiFENesin, [MAR Hold] hydrALAZINE, [MAR Hold] LORazepam, [MAR Hold] magnesium hydroxide, [MAR Hold] methocarbamol, [MAR Hold]  morphine injection, [MAR Hold] ondansetron **OR** [MAR Hold] ondansetron (ZOFRAN) IV, [MAR Hold] oxyCODONE-acetaminophen, sodium chloride irrigation Antibiotics: Anti-infectives (From admission, onward)   Start     Dose/Rate Route Frequency Ordered Stop   09/14/19 1200  ceFAZolin (ANCEF) IVPB 2g/100 mL premix     2 g 200 mL/hr over 30 Minutes Intravenous On call to O.R. 09/14/19 1153 09/14/19 1500       Objective: Physical Exam: Vitals:   09/14/19 0945 09/14/19 1318 09/14/19 1346 09/14/19 1421  BP: (!) 153/102  Marland Kitchen)  158/110   Pulse: 80  80   Resp: 20  20   Temp: 98 F (36.7 C)  98.2 F (36.8 C)   TempSrc: Axillary  Axillary   SpO2: 100%  92%   Weight:  60.1 kg  60.1 kg    Intake/Output Summary (Last 24 hours) at 09/14/2019 1717 Last data filed at 09/14/2019 1648 Gross per 24 hour  Intake --   Output 600 ml  Net -600 ml   Filed Weights   09/14/19 1318 09/14/19 1421  Weight: 60.1 kg 60.1 kg   General: alert and not oriented to time, place, and person. Appear in mild distress, affect anxious Eyes: PERRL, Conjunctiva bilateral mild injection ENT: Oral Mucosa Clear, dry  Neck: difficult to assess  JVD, no Abnormal Mass Or lumps Cardiovascular: S1 and S2 Present, no Murmur, peripheral pulses symmetrical Respiratory: good respiratory effort, Bilateral Air entry equal and Decreased, no signs of accessory muscle use, Clear to Auscultation, no Crackles, no wheezes Abdomen: Bowel Sound present, Soft and no tenderness,  Skin: no rashes  Extremities: no Pedal edema, no calf tenderness Neurologic: without any new focal findings Gait not checked due to patient safety concerns  Data Reviewed: I have personally reviewed and interpreted daily labs, tele strips, imagings as discussed above. I reviewed all nursing notes, pharmacy notes, vitals, pertinent old records I have discussed plan of care as described above with RN and patient/family.  CBC: Recent Labs  Lab 09/13/19 2228 09/14/19 0118  WBC 11.0* 12.6*  NEUTROABS 8.7*  --   HGB 11.1* 10.4*  HCT 34.2* 31.9*  MCV 91.7 91.7  PLT 315 [id number]   Basic Metabolic Panel: Recent Labs  Lab 09/13/19 2228 09/14/19 0118  NA 140 137  K 4.4 4.1  CL 105 106  CO2 24 22  GLUCOSE 131* 138*  BUN 33* 30*  CREATININE 0.89 0.78  CALCIUM 9.4 9.1    Liver Function Tests: No results for input(s): AST, ALT, ALKPHOS, BILITOT, PROT, ALBUMIN in the last 168 hours. No results for input(s): LIPASE, AMYLASE in the last 168 hours. No results for input(s): AMMONIA in the last 168 hours. Coagulation Profile: Recent Labs  Lab 09/14/19 0118  INR 1.0   Cardiac Enzymes: No results for input(s): CKTOTAL, CKMB, CKMBINDEX, TROPONINI in the last 168 hours. BNP (last 3 results) No results for input(s): PROBNP in the last 8760 hours. CBG: No results  for input(s): GLUCAP in the last 168 hours. Studies: Dg Chest 2 View  Result Date: 09/13/2019 CLINICAL DATA:  Left femur fracture. Fall today. EXAM: CHEST - 2 VIEW COMPARISON:  Radiograph 03/31/2019 FINDINGS: Patient had difficulty with positioning and could not lay flat. Rotated exam. Unchanged heart size and mediastinal contours allowing for positioning. No acute airspace disease, large pleural effusion or pneumothorax. No acute osseous abnormalities are seen. IMPRESSION: Rotated exam without acute abnormality. Electronically Signed   By: Keith Rake M.D.   On: 09/13/2019 21:29   Ct Head Wo Contrast  Result Date: 09/13/2019 CLINICAL DATA:  85 year old female status post unwitnessed fall. Left femur fracture. Altered mental status, combative. EXAM: CT HEAD WITHOUT CONTRAST CT CERVICAL SPINE WITHOUT CONTRAST TECHNIQUE: Multidetector CT imaging of the head and cervical spine was performed following the standard protocol without intravenous contrast. Multiplanar CT image reconstructions of the cervical spine were also generated. COMPARISON:  Head and cervical Spine CT 03/31/2019. FINDINGS: CT HEAD FINDINGS Brain: Stable cerebral volume. Stable gray-white matter differentiation throughout the brain. Mild to moderate for age patchy white  matter hypodensity. No midline shift, ventriculomegaly, mass effect, evidence of mass lesion, intracranial hemorrhage or evidence of cortically based acute infarction. No cortical encephalomalacia. Vascular: Mild Calcified atherosclerosis at the skull base. No suspicious intracranial vascular hyperdensity. Skull: Stable and intact. Sinuses/Orbits: Visualized paranasal sinuses and mastoids are stable and well pneumatized. Other: No acute orbit or scalp soft tissue finding. CT CERVICAL SPINE FINDINGS Motion artifact, most pronounced at C1-C2. Alignment: Stable since April, mild degenerative appearing anterolisthesis at C4-C5 and C7-T1. Bilateral posterior element alignment is  within normal limits. Skull base and vertebrae: Motion artifact at the skull base, C1 and C2. Chronic degenerative changes there. No acute osseous abnormality identified. Soft tissues and spinal canal: No prevertebral fluid or swelling. No visible canal hematoma. Disc levels: Stable. Chronic severe C1-C2 degeneration. Widespread cervical facet arthropathy. Chronic severe C6-C7 disc and endplate degeneration. Upper chest: Visible upper thoracic levels appear intact. Negative lung apices. IMPRESSION: 1. No acute traumatic injury identified in the head or cervical spine. Detail of C1 and C2 degraded by motion. 2. Stable non contrast CT appearance of the brain since April. Mild to moderate white matter changes most commonly due to small vessel disease. 3. Chronically advanced cervical spine degeneration. Electronically Signed   By: Genevie Ann M.D.   On: 09/13/2019 23:52   Ct Cervical Spine Wo Contrast  Result Date: 09/13/2019 CLINICAL DATA:  85 year old female status post unwitnessed fall. Left femur fracture. Altered mental status, combative. EXAM: CT HEAD WITHOUT CONTRAST CT CERVICAL SPINE WITHOUT CONTRAST TECHNIQUE: Multidetector CT imaging of the head and cervical spine was performed following the standard protocol without intravenous contrast. Multiplanar CT image reconstructions of the cervical spine were also generated. COMPARISON:  Head and cervical Spine CT 03/31/2019. FINDINGS: CT HEAD FINDINGS Brain: Stable cerebral volume. Stable gray-white matter differentiation throughout the brain. Mild to moderate for age patchy white matter hypodensity. No midline shift, ventriculomegaly, mass effect, evidence of mass lesion, intracranial hemorrhage or evidence of cortically based acute infarction. No cortical encephalomalacia. Vascular: Mild Calcified atherosclerosis at the skull base. No suspicious intracranial vascular hyperdensity. Skull: Stable and intact. Sinuses/Orbits: Visualized paranasal sinuses and mastoids  are stable and well pneumatized. Other: No acute orbit or scalp soft tissue finding. CT CERVICAL SPINE FINDINGS Motion artifact, most pronounced at C1-C2. Alignment: Stable since April, mild degenerative appearing anterolisthesis at C4-C5 and C7-T1. Bilateral posterior element alignment is within normal limits. Skull base and vertebrae: Motion artifact at the skull base, C1 and C2. Chronic degenerative changes there. No acute osseous abnormality identified. Soft tissues and spinal canal: No prevertebral fluid or swelling. No visible canal hematoma. Disc levels: Stable. Chronic severe C1-C2 degeneration. Widespread cervical facet arthropathy. Chronic severe C6-C7 disc and endplate degeneration. Upper chest: Visible upper thoracic levels appear intact. Negative lung apices. IMPRESSION: 1. No acute traumatic injury identified in the head or cervical spine. Detail of C1 and C2 degraded by motion. 2. Stable non contrast CT appearance of the brain since April. Mild to moderate white matter changes most commonly due to small vessel disease. 3. Chronically advanced cervical spine degeneration. Electronically Signed   By: Genevie Ann M.D.   On: 09/13/2019 23:52   Dg Hip Unilat With Pelvis 2-3 Views Left  Result Date: 09/13/2019 CLINICAL DATA:  Fall at care facility earlier today. Left hip/femur pain. Deformity. EXAM: DG HIP (WITH OR WITHOUT PELVIS) 2-3V LEFT COMPARISON:  Concurrent femur radiograph. FINDINGS: Patient had difficulty with positioning, with combative, and could not lie flat. Left hip arthroplasty is intact.  No periprosthetic lucency or fracture. Mid-distal femoral shaft fracture better visualized on concurrent femur exam. IMPRESSION: 1. Intact left hip arthroplasty. No periprosthetic fracture. 2. Mid-distal femoral shaft fracture better visualized on concurrent femur exam. 3. Limited evaluation due to patient medical condition. Electronically Signed   By: Keith Rake M.D.   On: 09/13/2019 21:27   Dg  Femur Min 2 Views Left  Result Date: 09/13/2019 CLINICAL DATA:  85 year old female status post fall today. Combative. EXAM: LEFT FEMUR 2 VIEWS COMPARISON:  Left hip series reported separately. FINDINGS: Left hip details are reported separately. Superimposed left total knee arthroplasty. Long segment spiral fracture of the distal left femoral shaft terminating about 4 centimeters proximal to the distal femoral hardware. 1/2 shaft width medial displacement, posterior displacement, as well as over riding and posterior angulation. Grossly aligned total knee arthroplasty components. IMPRESSION: 1. Long segment spiral fracture of the distal left femoral shaft with 1/2 shaft width displacement, over-riding, and posterior angulation. 2. Superimposed left total knee arthroplasty appears intact. 3. Left hip details reported separately. Electronically Signed   By: Genevie Ann M.D.   On: 09/13/2019 21:30     Time spent: 35 minutes  Author: Berle Mull, MD [facility] 09/14/2019 5:17 PM  To reach On-call, see care teams to locate the attending and reach out to them via [email or web address]. If 7PM-7AM, please contact night-coverage If you still have difficulty reaching the attending provider, please page the [facility] (Director on Call) for [facility] on amion for assistance.

## 2019-09-14 NOTE — Anesthesia Postprocedure Evaluation (Signed)
Anesthesia Post Note  Patient: Clotilda Maciolek  Procedure(s) Performed: OPEN REDUCTION INTERNAL FIXATION (ORIF) DISTAL FEMUR FRACTURE (Left )     Patient location during evaluation: PACU Anesthesia Type: General Level of consciousness: awake and alert Pain management: pain level controlled Vital Signs Assessment: post-procedure vital signs reviewed and stable Respiratory status: spontaneous breathing, nonlabored ventilation and respiratory function stable Cardiovascular status: blood pressure returned to baseline and stable Postop Assessment: no apparent nausea or vomiting Anesthetic complications: no    Last Vitals:  Vitals:   09/14/19 1830 09/14/19 1845  BP: (!) 173/110 (!) 124/53  Pulse: 72 82  Resp: 13 19  Temp:    SpO2: 98% 100%    Last Pain:  Vitals:   09/14/19 1845  TempSrc:   PainSc: 0-No pain                 Lynda Rainwater

## 2019-09-14 NOTE — Progress Notes (Signed)
Patient's UA nitrate +. Paged floor coverage.

## 2019-09-14 NOTE — Anesthesia Preprocedure Evaluation (Addendum)
Anesthesia Evaluation  Patient identified by MRN, date of birth, ID band Patient awake    Reviewed: Allergy & Precautions, NPO status , Patient's Chart, lab work & pertinent test results, reviewed documented beta blocker date and time   History of Anesthesia Complications Negative for: history of anesthetic complications  Airway Mallampati: II  TM Distance: >3 FB Neck ROM: Full    Dental no notable dental hx.    Pulmonary neg pulmonary ROS,    Pulmonary exam normal breath sounds clear to auscultation       Cardiovascular hypertension, Pt. on medications and Pt. on home beta blockers negative cardio ROS Normal cardiovascular exam Rhythm:Regular Rate:Normal     Neuro/Psych Depression Dementia negative neurological ROS  negative psych ROS   GI/Hepatic negative GI ROS, Neg liver ROS,   Endo/Other  negative endocrine ROSHyperthyroidism   Renal/GU negative Renal ROS  negative genitourinary   Musculoskeletal negative musculoskeletal ROS (+) Arthritis ,   Abdominal   Peds negative pediatric ROS (+)  Hematology negative hematology ROS (+) anemia , Hgb 10.4   Anesthesia Other Findings Day of surgery medications reviewed with patient.  Reproductive/Obstetrics negative OB ROS                            Anesthesia Physical Anesthesia Plan  ASA: III  Anesthesia Plan: General   Post-op Pain Management:    Induction: Intravenous  PONV Risk Score and Plan: 3 and Treatment may vary due to age or medical condition, Ondansetron, Dexamethasone and Midazolam  Airway Management Planned: Oral ETT  Additional Equipment: None  Intra-op Plan:   Post-operative Plan: Extubation in OR  Informed Consent: I have reviewed the patients History and Physical, chart, labs and discussed the procedure including the risks, benefits and alternatives for the proposed anesthesia with the patient or authorized  representative who has indicated his/her understanding and acceptance.   Patient has DNR.   Dental advisory given  Plan Discussed with: CRNA  Anesthesia Plan Comments:        Anesthesia Quick Evaluation

## 2019-09-15 LAB — CBC
HCT: 25.8 % — ABNORMAL LOW (ref 36.0–46.0)
Hemoglobin: 8.4 g/dL — ABNORMAL LOW (ref 12.0–15.0)
MCH: 30 pg (ref 26.0–34.0)
MCHC: 32.6 g/dL (ref 30.0–36.0)
MCV: 92.1 fL (ref 80.0–100.0)
Platelets: 269 10*3/uL (ref 150–400)
RBC: 2.8 MIL/uL — ABNORMAL LOW (ref 3.87–5.11)
RDW: 13.6 % (ref 11.5–15.5)
WBC: 10.9 10*3/uL — ABNORMAL HIGH (ref 4.0–10.5)
nRBC: 0 % (ref 0.0–0.2)

## 2019-09-15 LAB — BASIC METABOLIC PANEL
Anion gap: 9 (ref 5–15)
BUN: 28 mg/dL — ABNORMAL HIGH (ref 8–23)
CO2: 22 mmol/L (ref 22–32)
Calcium: 8.2 mg/dL — ABNORMAL LOW (ref 8.9–10.3)
Chloride: 103 mmol/L (ref 98–111)
Creatinine, Ser: 0.81 mg/dL (ref 0.44–1.00)
GFR calc Af Amer: >60 mL/min (ref >60)
GFR calc non Af Amer: >60 mL/min (ref >60)
Glucose, Bld: 161 mg/dL — ABNORMAL HIGH (ref 70–99)
Potassium: 4.5 mmol/L (ref 3.5–5.1)
Sodium: 134 mmol/L — ABNORMAL LOW (ref 135–145)

## 2019-09-15 MED ORDER — KCL IN DEXTROSE-NACL 20-5-0.45 MEQ/L-%-% IV SOLN
INTRAVENOUS | Status: DC
  Administered 2019-09-15 – 2019-09-17 (×3): via INTRAVENOUS
  Filled 2019-09-15 (×3): qty 1000

## 2019-09-15 MED ORDER — LACTATED RINGERS IV SOLN: INTRAVENOUS | Status: DC

## 2019-09-15 NOTE — Progress Notes (Signed)
[facility] Progress Note  Patient: Lisa [name] [id number]   PCP: Patient, No Pcp Per DOB: Dec 03, 1928   DOA: 09/13/2019   DOS: 09/15/2019   Date of Service: the patient was seen and examined on 09/15/2019  Chief Complaint  Patient presents with  . Hip Pain  . Fall   Brief hospital course: Pt. with PMH of HTN, HLD, hypothyroidism, depression, PVD, dementia, under active hospice, glaucoma; presented with complain of fall, was found to have left distal femur shaft fracture.  Currently further plan is monitor postoperative recovery  Subjective: Patient confused unable to answer questions.  No nausea no vomiting.  Occasionally agitated.  Minimal oral intake reported by RN.  No bowel movement so far.  Assessment and Plan: Closed fracture of shaft of left femur ([place]):  As evidenced by x-ray. Patient has severe pain now.  Orthopedic surgeon, Dr. Lyla Glassing was consulted.  Dr. Lyla Glassing consulted with patient's daughter who is the POA and family has decided to go with the surgery for now. While the patient's perioperative myocardial infarction or cardiac arrest risk is 0.7% which is moderate and not prohibitive patient is at significant risk for poor surgical outcome secondary to her dementia. Monitor closely for postoperative delirium. Provide IV fluids for now.  Leukocytosis: Likely due to stress-induced demargination. Patient does not have signs of infection. -Follow-up cultures  Essential hypertension: -Losartan, metoprolol, amlodipine -As needed hydralazine orally  Alzheimer disease ([place]): has agitation and anxiety today. -f/u UA  -continue home ativan  Depression with anxiety -Lexapro and ativan  Fall: -pt/ot when able to   Hyperthyroidism: -Continue methimazole  Diet: Continue current diet DVT Prophylaxis: SCD, pharmacological prophylaxis contraindicated due to Pending surgery  Advance goals of care discussion: DNR  Family Communication: no family was  present at bedside, at the time of interview.   Disposition:  Discharge to be determined .  Consultants: Orthopedic Dr. Lyla Glassing Procedures: none  Scheduled Meds: . amLODipine  10 mg Oral Daily  . busPIRone  10 mg Oral BID  . cholecalciferol  1,000 Units Oral Daily  . docusate sodium  100 mg Oral BID  . enoxaparin (LOVENOX) injection  40 mg Subcutaneous Q24H  . escitalopram  10 mg Oral Daily  . latanoprost  1 drop Both Eyes QHS  . losartan  100 mg Oral Daily  . loteprednol  1 drop Both Eyes BID  . methimazole  5 mg Oral Daily  . metoprolol succinate  25 mg Oral Daily  . traZODone  50 mg Oral QHS   Continuous Infusions: . sodium chloride 10 mL/hr at 09/14/19 2200  . dextrose 5 % and 0.45 % NaCl with KCl 20 mEq/L     PRN Meds: sodium chloride, acetaminophen **OR** [DISCONTINUED] acetaminophen, alum & mag hydroxide-simeth, guaiFENesin, hydrALAZINE, LORazepam, magnesium hydroxide, menthol-cetylpyridinium **OR** phenol, methocarbamol, metoCLOPramide **OR** metoCLOPramide (REGLAN) injection, morphine injection, ondansetron **OR** ondansetron (ZOFRAN) IV, oxyCODONE-acetaminophen Antibiotics: Anti-infectives (From admission, onward)   Start     Dose/Rate Route Frequency Ordered Stop   09/14/19 2100  ceFAZolin (ANCEF) IVPB 2g/100 mL premix     2 g 200 mL/hr over 30 Minutes Intravenous Every 6 hours 09/14/19 1951 09/15/19 0347   09/14/19 1200  ceFAZolin (ANCEF) IVPB 2g/100 mL premix     2 g 200 mL/hr over 30 Minutes Intravenous On call to O.R. 09/14/19 1153 09/14/19 1500       Objective: Physical Exam: Vitals:   09/15/19 0801 09/15/19 0919 09/15/19 1415 09/15/19 1445  BP: 122/83 (!) 127/47 114/87  Pulse: 84 81 79   Resp: 14  16   Temp: 98.6 F (37 C)     TempSrc: Axillary  Other (Comment)   SpO2: 100%  98% 98%  Weight:        Intake/Output Summary (Last 24 hours) at 09/15/2019 1951 Last data filed at 09/15/2019 1700 Gross per 24 hour  Intake 514.91 ml  Output 475 ml   Net 39.91 ml   Filed Weights   09/14/19 1318 09/14/19 1421  Weight: 60.1 kg 60.1 kg   General: alert and not oriented to time, place, and person. Appear in mild distress, affect anxious Eyes: PERRL, Conjunctiva bilateral mild injection ENT: Oral Mucosa Clear, dry  Neck: difficult to assess  JVD, no Abnormal Mass Or lumps Cardiovascular: S1 and S2 Present, no Murmur, peripheral pulses symmetrical Respiratory: good respiratory effort, Bilateral Air entry equal and Decreased, no signs of accessory muscle use, Clear to Auscultation, no Crackles, no wheezes Abdomen: Bowel Sound present, Soft and no tenderness,  Skin: no rashes  Extremities: no Pedal edema, no calf tenderness Neurologic: without any new focal findings Gait not checked due to patient safety concerns  Data Reviewed: I have personally reviewed and interpreted daily labs, tele strips, imagings as discussed above. I reviewed all nursing notes, pharmacy notes, vitals, pertinent old records I have discussed plan of care as described above with RN and patient/family.  CBC: Recent Labs  Lab 09/13/19 2228 09/14/19 0118 09/14/19 1849 09/15/19 0305  WBC 11.0* 12.6* 15.5* 10.9*  NEUTROABS 8.7*  --   --   --   HGB 11.1* 10.4* 10.3* 8.4*  HCT 34.2* 31.9* 31.1* 25.8*  MCV 91.7 91.7 90.7 92.1  PLT 315 336 340 [id number]   Basic Metabolic Panel: Recent Labs  Lab 09/13/19 2228 09/14/19 0118 09/14/19 1849 09/15/19 0305  NA 140 137  --  134*  K 4.4 4.1  --  4.5  CL 105 106  --  103  CO2 24 22  --  22  GLUCOSE 131* 138*  --  161*  BUN 33* 30*  --  28*  CREATININE 0.89 0.78 0.75 0.81  CALCIUM 9.4 9.1  --  8.2*    Liver Function Tests: No results for input(s): AST, ALT, ALKPHOS, BILITOT, PROT, ALBUMIN in the last 168 hours. No results for input(s): LIPASE, AMYLASE in the last 168 hours. No results for input(s): AMMONIA in the last 168 hours. Coagulation Profile: Recent Labs  Lab 09/14/19 0118  INR 1.0   Cardiac Enzymes:  No results for input(s): CKTOTAL, CKMB, CKMBINDEX, TROPONINI in the last 168 hours. BNP (last 3 results) No results for input(s): PROBNP in the last 8760 hours. CBG: No results for input(s): GLUCAP in the last 168 hours. Studies: Dg Femur Port Min 2 Views Left  Result Date: 09/14/2019 CLINICAL DATA:  85 year old female status post distal left femur ORIF. EXAM: LEFT FEMUR PORTABLE 2 VIEWS COMPARISON:  Intraoperative images 16 21 hours today. FINDINGS: Three portable views at 2018 hours. Along segment lateral plate with multiple screws and cerclage wires has been placed along the left femoral shaft superimposed on preexisting left total knee arthroplasty and left hip arthroplasty. Near anatomic alignment. Overlying skin staples. No new osseous abnormality identified. IMPRESSION: 1. ORIF of the left femur with no adverse features. 2. Pre-existing left hip and knee arthroplasties. Electronically Signed   By: Genevie Ann M.D.   On: 09/14/2019 20:39     Time spent: 35 minutes  Author: Berle Mull, MD [facility]  09/15/2019 7:51 PM  To reach On-call, see care teams to locate the attending and reach out to them via [email or web address]. If 7PM-7AM, please contact night-coverage If you still have difficulty reaching the attending provider, please page the [facility] (Director on Call) for [facility] on amion for assistance.

## 2019-09-15 NOTE — Plan of Care (Signed)
  Problem: Clinical Measurements: Goal: Ability to maintain clinical measurements within normal limits will improve Outcome: Progressing Goal: Will remain free from infection Outcome: Progressing Goal: Respiratory complications will improve Outcome: Progressing Goal: Cardiovascular complication will be avoided Outcome: Progressing   Problem: Pain Managment: Goal: General experience of comfort will improve Outcome: Progressing   Problem: Safety: Goal: Ability to remain free from injury will improve Outcome: Progressing   

## 2019-09-15 NOTE — Progress Notes (Signed)
    Subjective:  No events. Refused PT.  Objective:   VITALS:   Vitals:   09/15/19 0102 09/15/19 0421 09/15/19 0801 09/15/19 0919  BP: (!) 118/51 (!) 121/59 122/83 (!) 127/47  Pulse: 68 71 84 81  Resp: 14 14 14    Temp: 98.1 F (36.7 C) 98 F (36.7 C) 98.6 F (37 C)   TempSrc: Axillary Axillary Axillary   SpO2: 100% 100% 100%   Weight:        NAD, confused and agitated Intact pulses distally Dorsiflexion/Plantar flexion intact Incision: dressing C/D/I Refuses to follow commands Spontaneously moves her ankle and toes Unable to assess sensation due to MS  Lab Results  Component Value Date   WBC 10.9 (H) 09/15/2019   HGB 8.4 (L) 09/15/2019   HCT 25.8 (L) 09/15/2019   MCV 92.1 09/15/2019   PLT 269 09/15/2019   BMET    Component Value Date/Time   NA 134 (L) 09/15/2019 0305   K 4.5 09/15/2019 0305   CL 103 09/15/2019 0305   CO2 22 09/15/2019 0305   GLUCOSE 161 (H) 09/15/2019 0305   BUN 28 (H) 09/15/2019 0305   CREATININE 0.81 09/15/2019 0305   CALCIUM 8.2 (L) 09/15/2019 0305   GFRNONAA >60 09/15/2019 0305   GFRAA >60 09/15/2019 0305     Assessment/Plan: 1 Day Post-Op   Principal Problem:   Closed fracture of shaft of left femur (HCC) Active Problems:   Essential hypertension   Alzheimer disease (HCC)   Depression   Fall   Leukocytosis   Hyperthyroidism   TDWB LLE with walker DVT ppx: Lovenox, SCDs, TEDS PO pain control PT/OT Dispo: SNF placement   Lisa [name] Lisa [name] 09/15/2019, 11:01 AM   Rod Can, MD Cell: [phone] [place] is now [facility]  [facility] [address], [address], [place], [place] [address] Phone: [phone] [email or web address] Facebook  [employer]

## 2019-09-15 NOTE — Progress Notes (Signed)
PT Cancellation Note  Patient Details Name: Lisa [name] MRN: [id number] DOB: 1928/05/05   Cancelled Treatment:     PT order received but eval deferred - pt states "I am just so tired" and requests to be seen later.  Will follow.  [place] Pager [phone] Office [phone]    Kaho Selle 09/15/2019, 11:04 AM

## 2019-09-16 LAB — CBC WITH DIFFERENTIAL/PLATELET
Basophils Absolute: 0 10*3/uL (ref 0.0–0.1)
Basophils Relative: 0 %
Eosinophils Absolute: 0.1 10*3/uL (ref 0.0–0.5)
Eosinophils Relative: 1 %
HCT: 23.2 % — ABNORMAL LOW (ref 36.0–46.0)
Hemoglobin: 7.4 g/dL — ABNORMAL LOW (ref 12.0–15.0)
Lymphocytes Relative: 25 %
Lymphs Abs: 2.7 10*3/uL (ref 0.7–4.0)
MCH: 29.6 pg (ref 26.0–34.0)
MCHC: 31.9 g/dL (ref 30.0–36.0)
MCV: 92.8 fL (ref 80.0–100.0)
Monocytes Absolute: 2.2 10*3/uL — ABNORMAL HIGH (ref 0.1–1.0)
Monocytes Relative: 20 %
Neutro Abs: 6.1 10*3/uL (ref 1.7–7.7)
Neutrophils Relative %: 55 %
Platelets: 190 10*3/uL (ref 150–400)
RBC: 2.5 MIL/uL — ABNORMAL LOW (ref 3.87–5.11)
RDW: 13.9 % (ref 11.5–15.5)
WBC: 11.1 10*3/uL — ABNORMAL HIGH (ref 4.0–10.5)

## 2019-09-16 LAB — BASIC METABOLIC PANEL
Anion gap: 8 (ref 5–15)
BUN: 26 mg/dL — ABNORMAL HIGH (ref 8–23)
CO2: 23 mmol/L (ref 22–32)
Calcium: 8.5 mg/dL — ABNORMAL LOW (ref 8.9–10.3)
Chloride: 107 mmol/L (ref 98–111)
Creatinine, Ser: 0.78 mg/dL (ref 0.44–1.00)
GFR calc Af Amer: >60 mL/min (ref >60)
GFR calc non Af Amer: >60 mL/min (ref >60)
Glucose, Bld: 147 mg/dL — ABNORMAL HIGH (ref 70–99)
Potassium: 3.9 mmol/L (ref 3.5–5.1)
Sodium: 138 mmol/L (ref 135–145)

## 2019-09-16 LAB — CBC
HCT: 22.9 % — ABNORMAL LOW (ref 36.0–46.0)
Hemoglobin: 7.1 g/dL — ABNORMAL LOW (ref 12.0–15.0)
MCH: 30 pg (ref 26.0–34.0)
MCHC: 31 g/dL (ref 30.0–36.0)
MCV: 96.6 fL (ref 80.0–100.0)
Platelets: 235 10*3/uL (ref 150–400)
RBC: 2.37 MIL/uL — ABNORMAL LOW (ref 3.87–5.11)
RDW: 14.1 % (ref 11.5–15.5)
WBC: 9.3 10*3/uL (ref 4.0–10.5)
nRBC: 0 % (ref 0.0–0.2)

## 2019-09-16 MED ORDER — OXYCODONE-ACETAMINOPHEN 5-325 MG PO TABS: 1.0000 | ORAL_TABLET | ORAL | 0 refills | Status: AC | PRN

## 2019-09-16 MED ORDER — ENOXAPARIN SODIUM 40 MG/0.4ML ~~LOC~~ SOLN: 40.0000 mg | SUBCUTANEOUS | 0 refills | Status: AC

## 2019-09-16 NOTE — Progress Notes (Signed)
[facility] Progress Note  Patient: Lisa [name] [id number]   PCP: Patient, No Pcp Per DOB: Nov 21, 1928   DOA: 09/13/2019   DOS: 09/16/2019   Date of Service: the patient was seen and examined on 09/16/2019  Chief Complaint  Patient presents with  . Hip Pain  . Fall   Brief hospital course: Pt. with PMH of HTN, HLD, hypothyroidism, depression, PVD, dementia, under active hospice, glaucoma; presented with complain of fall, was found to have left distal femur shaft fracture. S/P ORIF 09/14/2019. Currently further plan is monitor for postoperative recovery.  Subjective: Patient is drowsy and sleepy.  No acute events overnight.  Unable to follow commands.  Assessment and Plan: Closed fracture of shaft of left femur ([place]): As evidenced by x-ray.  Orthopedic surgeon, Dr. Claretta Fraise consulted.  Dr. Lyla Glassing consulted with patient's daughter who is the POA and family has decided to go with the surgery for now. Patient is at significant risk for poor surgical outcome secondary to her dementia.  Postop acute blood loss anemia. Hemoglobin dropped from 11.1 on admission to 7.1 right now. We will recheck it in few hours. Transfuse hemoglobin less than 7. No active external bleeding reported. Continue Lovenox for now.  Postop delirium. Patient is significantly confused.  Unable to take anything by mouth. Double to participate in care. We will minimize her psychotropic medications. CT head and C-spine on admissions were unremarkable.  Leukocytosis: Likely due to stress-induced demargination. Patient does not have signs of infection. Resolved.  Essential hypertension: -Losartan, metoprolol, amlodipine -As needed hydralazine orally  Alzheimer disease ([place]):  hasagitation and anxiety every day since hospitalization -Discontinue scheduled home Ativan, change to PRN Ativan  Depressionwith anxiety -Lexaproand ativan, currently on hold secondary to excessive sleepiness   Fall: -pt/ot when able to  Hyperthyroidism: -On methimazole will hold for now.  Body mass index is 21.39 kg/m.  Nutrition Problem: Increased nutrient needs Etiology: acute illness, post-op healing Interventions: Interventions: Refer to RD note for recommendations      Diet: Regular diet DVT Prophylaxis: Subcutaneous Lovenox  Advance goals of care discussion: DNR, on hospice  Family Communication: no family was present at bedside, at the time of interview.  Disposition:  Discharge to be determined .  Consultants: Orthopedic, Dr. Lyla Glassing Procedures: ORIF 09/14/2019.  Scheduled Meds: . amLODipine  10 mg Oral Daily  . busPIRone  10 mg Oral BID  . cholecalciferol  1,000 Units Oral Daily  . docusate sodium  100 mg Oral BID  . enoxaparin (LOVENOX) injection  40 mg Subcutaneous Q24H  . escitalopram  10 mg Oral Daily  . latanoprost  1 drop Both Eyes QHS  . losartan  100 mg Oral Daily  . loteprednol  1 drop Both Eyes BID  . methimazole  5 mg Oral Daily  . metoprolol succinate  25 mg Oral Daily  . traZODone  50 mg Oral QHS   Continuous Infusions: . sodium chloride 10 mL/hr at 09/14/19 2200  . dextrose 5 % and 0.45 % NaCl with KCl 20 mEq/L 75 mL/hr at 09/15/19 2100   PRN Meds: sodium chloride, acetaminophen **OR** [DISCONTINUED] acetaminophen, alum & mag hydroxide-simeth, guaiFENesin, hydrALAZINE, LORazepam, magnesium hydroxide, menthol-cetylpyridinium **OR** phenol, methocarbamol, morphine injection, ondansetron **OR** ondansetron (ZOFRAN) IV, oxyCODONE-acetaminophen Antibiotics: Anti-infectives (From admission, onward)   Start     Dose/Rate Route Frequency Ordered Stop   09/14/19 2100  ceFAZolin (ANCEF) IVPB 2g/100 mL premix     2 g 200 mL/hr over 30 Minutes Intravenous Every 6 hours 09/14/19 1951  09/15/19 0347   09/14/19 1200  ceFAZolin (ANCEF) IVPB 2g/100 mL premix     2 g 200 mL/hr over 30 Minutes Intravenous On call to O.R. 09/14/19 1153 09/14/19 1500        Objective: Physical Exam: Vitals:   09/15/19 1445 09/15/19 2221 09/16/19 0438 09/16/19 0932  BP:  (!) 127/54 (!) 143/50 (!) 140/45  Pulse:  80 78 72  Resp:  18 16   Temp:  98.7 F (37.1 C) 98.6 F (37 C) 97.8 F (36.6 C)  TempSrc:  Axillary Oral Axillary  SpO2: 98% 100%  93%  Weight:        Intake/Output Summary (Last 24 hours) at 09/16/2019 0942 Last data filed at 09/16/2019 0600 Gross per 24 hour  Intake 955.03 ml  Output 250 ml  Net 705.03 ml   Filed Weights   09/14/19 1318 09/14/19 1421  Weight: 60.1 kg 60.1 kg   General: lethargic and not oriented to time, place, and person. Appear in moderate distress, affect emotionally labile Eyes: PERRL, Conjunctiva normal ENT: Oral Mucosa Clear, dry  Neck: difficult to assess  JVD, no Abnormal Mass Or lumps Cardiovascular: S1 and S2 Present, no Murmur, peripheral pulses symmetrical Respiratory: good respiratory effort, Bilateral Air entry equal and Decreased, no signs of accessory muscle use, Clear to Auscultation, no Crackles, no wheezes Abdomen: Bowel Sound present, Soft and difficult to assess  tenderness, no hernia Skin: no rashes  Extremities: no Pedal edema, no calf tenderness Neurologic: without any new focal findings although examination is highly limited secondary to patient's poor cooperation. Gait not checked due to patient safety concerns  Data Reviewed: I have personally reviewed and interpreted daily labs, tele strips, imagings as discussed above. I reviewed all nursing notes, pharmacy notes, vitals, pertinent old records I have discussed plan of care as described above with RN and patient/family.  CBC: Recent Labs  Lab 09/13/19 2228 09/14/19 0118 09/14/19 1849 09/15/19 0305 09/16/19 0249  WBC 11.0* 12.6* 15.5* 10.9* 9.3  NEUTROABS 8.7*  --   --   --   --   HGB 11.1* 10.4* 10.3* 8.4* 7.1*  HCT 34.2* 31.9* 31.1* 25.8* 22.9*  MCV 91.7 91.7 90.7 92.1 96.6  PLT 315 336 340 269 [id number]   [name] Metabolic Panel:  Recent Labs  Lab 09/13/19 2228 09/14/19 0118 09/14/19 1849 09/15/19 0305 09/16/19 0249  NA 140 137  --  134* 138  K 4.4 4.1  --  4.5 3.9  CL 105 106  --  103 107  CO2 24 22  --  22 23  GLUCOSE 131* 138*  --  161* 147*  BUN 33* 30*  --  28* 26*  CREATININE 0.89 0.78 0.75 0.81 0.78  CALCIUM 9.4 9.1  --  8.2* 8.5*    Liver Function Tests: No results for input(s): AST, ALT, ALKPHOS, BILITOT, PROT, ALBUMIN in the last 168 hours. No results for input(s): LIPASE, AMYLASE in the last 168 hours. No results for input(s): AMMONIA in the last 168 hours. Coagulation Profile: Recent Labs  Lab 09/14/19 0118  INR 1.0   Cardiac Enzymes: No results for input(s): CKTOTAL, CKMB, CKMBINDEX, TROPONINI in the last 168 hours. BNP (last 3 results) No results for input(s): PROBNP in the last 8760 hours. CBG: No results for input(s): GLUCAP in the last 168 hours. Studies: No results found.   Time spent: 35 minutes  Author: Berle Mull, MD [facility] 09/16/2019 9:42 AM  To reach On-call, see care teams to locate the  attending and reach out to them via [email or web address]. If 7PM-7AM, please contact night-coverage If you still have difficulty reaching the attending provider, please page the [facility] (Director on Call) for [facility] on amion for assistance.

## 2019-09-16 NOTE — TOC Initial Note (Signed)
Transition of Care [facility]) - Initial/Assessment Note    Patient Details  Name: Lisa [name] MRN: [id number] Date of Birth: 1928-04-13  Transition of Care [facility] CM/SW Contact:    Lia Hopping, [place] Phone Number: 09/16/2019, 5:59 PM  Clinical Narrative:                 CSW reached out to the patient daughter to discuss SNF placement options. Patient daughter reports the patient resides at [facility]. The patient has been under the patient of Hospice due to her frequent fall, "so instead of bringing her to the hospital after a fall, we would call hospice instead." She reports they revoked hospice so the patient could have surgery. Daughter reports she would like the patient to go rehab at [facility]. She is concern about the patient may not progress  in therapy due to her advanced dementia and glaucoma.  CSW explain the SNF placement process to the patient daughter. TOC staff will follow up with a list of SNF bed offers.  Fl2 Done. PASRR done.   Expected Discharge Plan: Skilled Nursing Facility Barriers to Discharge: No Barriers Identified   Patient Goals and CMS Choice Patient states their goals for this hospitalization and ongoing recovery are:: Daughter: to determine how she will recover this time at [facility] CMS Medicare.gov Compare Post Acute Care list provided to:: Other (Comment Required)(Daughter) Choice offered to / list presented to : Adult Children  Expected Discharge Plan and Services Expected Discharge Plan: [place] In-house Referral: Clinical Social Work Discharge Planning Services: CM Consult Post Acute Care Choice: [place] Living arrangements for the past 2 months: (Memory Care)                                      Prior Living Arrangements/Services Living arrangements for the past 2 months: (Memory Care) Lives with:: Facility Resident Patient language and need for interpreter reviewed:: No Do you feel  safe going back to the place where you live?: Yes      Need for Family Participation in Patient Care: Yes (Comment) Care giver support system in place?: Yes (comment) Current home services: Hospice(Currently revoked) Criminal Activity/Legal Involvement Pertinent to Current Situation/Hospitalization: No - Comment as needed  Activities of Daily Living Home Assistive Devices/Equipment: Gilford Rile (specify type) ADL Screening (condition at time of admission) Patient's cognitive ability adequate to safely complete daily activities?: No Is the patient deaf or have difficulty hearing?: No Does the patient have difficulty seeing, even when wearing glasses/contacts?: No Does the patient have difficulty concentrating, remembering, or making decisions?: Yes Patient able to express need for assistance with ADLs?: No Does the patient have difficulty dressing or bathing?: Yes Independently performs ADLs?: No Communication: Needs assistance Is this a change from baseline?: Pre-admission baseline Dressing (OT): Needs assistance Is this a change from baseline?: Pre-admission baseline Grooming: Needs assistance Is this a change from baseline?: Pre-admission baseline Feeding: Needs assistance Is this a change from baseline?: Pre-admission baseline Bathing: Needs assistance Is this a change from baseline?: Pre-admission baseline Toileting: Needs assistance Is this a change from baseline?: Pre-admission baseline In/Out Bed: Needs assistance Is this a change from baseline?: Pre-admission baseline Walks in Home: Needs assistance Is this a change from baseline?: Pre-admission baseline Does the patient have difficulty walking or climbing stairs?: Yes Weakness of Legs: None Weakness of Arms/Hands: Left  Permission Sought/Granted Permission sought to  share information with : Case Manager       Permission granted to share info w AGENCY: SNF in area  Permission granted to share info w Relationship:  Daughter     Emotional Assessment Appearance:: Appears stated age Attitude/Demeanor/Rapport: Unable to Assess Affect (typically observed): Unable to Assess Orientation: : (disoriented) Alcohol / Substance Use: Not Applicable Psych Involvement: No (comment)  Admission diagnosis:  Fall, initial encounter [W19.XXXA] Closed fracture of shaft of left femur, unspecified fracture morphology, initial encounter [facility] [S72.302A] Patient Active Problem List   Diagnosis Date Noted  . Fall 09/13/2019  . Closed fracture of shaft of left femur ([place]) 09/13/2019  . Leukocytosis 09/13/2019  . Hyperthyroidism 09/13/2019  . Fracture of femoral shaft, left, closed ([place]) 09/13/2019  . Left wrist injury, initial encounter 06/03/2017  . Distal radius fracture, left 06/03/2017  . Depression 09/02/2016  . Well adult exam 08/31/2014  . Fatigue 07/22/2011  . Syncopal episodes 06/25/2011  . Hyponatremia 06/25/2011  . Alzheimer disease ([place]) 06/25/2011  . DIARRHEA 08/01/2008  . PERIPHERAL VASCULAR DISEASE 07/11/2008  . NEOPLASM OF UNCERTAIN BEHAVIOR OF SKIN 05/23/2008  . OTHER NONTHROMBOCYTOPENIC PURPURAS 05/23/2008  . MYALGIA 05/23/2008  . Hypothyroidism 11/23/2007  . HYPERLIPIDEMIA 11/23/2007  . Essential hypertension 11/23/2007  . OSTEOARTHRITIS 11/23/2007  . LOW BACK PAIN 11/23/2007   PCP:  Patient, No Pcp Per Pharmacy:  No Pharmacies Listed    Social Determinants of Health (SDOH) Interventions    Readmission Risk Interventions No flowsheet data found.

## 2019-09-16 NOTE — Plan of Care (Signed)

## 2019-09-16 NOTE — NC FL2 (Signed)
[place] LEVEL OF CARE SCREENING TOOL     IDENTIFICATION  Patient Name: Lisa [name] Birthdate: 1928-11-02 Sex: female Admission Date (Current Location): 09/13/2019  [facility] and [place] Number:  Herbalist and Address:  [facility],  [address], [place]      Provider Number: [id number]  Attending Physician Name and Address:  Lavina Hamman, MD  Relative Name and Phone Number:  daughter Juliann Pulse [phone]    Current Level of Care: Hospital Recommended Level of Care: [place] Prior Approval Number:    Date Approved/Denied:   PASRR Number: [id number]  Discharge Plan: SNF    Current Diagnoses: Patient Active Problem List   Diagnosis Date Noted  . Fall 09/13/2019  . Closed fracture of shaft of left femur ([place]) 09/13/2019  . Leukocytosis 09/13/2019  . Hyperthyroidism 09/13/2019  . Fracture of femoral shaft, left, closed ([place]) 09/13/2019  . Left wrist injury, initial encounter 06/03/2017  . Distal radius fracture, left 06/03/2017  . Depression 09/02/2016  . Well adult exam 08/31/2014  . Fatigue 07/22/2011  . Syncopal episodes 06/25/2011  . Hyponatremia 06/25/2011  . Alzheimer disease ([place]) 06/25/2011  . DIARRHEA 08/01/2008  . PERIPHERAL VASCULAR DISEASE 07/11/2008  . NEOPLASM OF UNCERTAIN BEHAVIOR OF SKIN 05/23/2008  . OTHER NONTHROMBOCYTOPENIC PURPURAS 05/23/2008  . MYALGIA 05/23/2008  . Hypothyroidism 11/23/2007  . HYPERLIPIDEMIA 11/23/2007  . Essential hypertension 11/23/2007  . OSTEOARTHRITIS 11/23/2007  . LOW BACK PAIN 11/23/2007    Orientation RESPIRATION BLADDER Height & Weight     Self, Place  Normal Incontinent Weight: 132 lb 7.9 oz (60.1 kg) Height:     BEHAVIORAL SYMPTOMS/MOOD NEUROLOGICAL BOWEL NUTRITION STATUS      Incontinent Diet(regular)  AMBULATORY STATUS COMMUNICATION OF NEEDS Skin   Extensive Assist Verbally Surgical wounds                       Personal Care  Assistance Level of Assistance  Bathing, Feeding, Dressing Bathing Assistance: Maximum assistance Feeding assistance: Maximum assistance Dressing Assistance: Maximum assistance     Functional Limitations Info  Sight, Hearing, Speech Sight Info: Impaired(blind) Hearing Info: Adequate Speech Info: Adequate    SPECIAL CARE FACTORS FREQUENCY  PT (By licensed PT), OT (By licensed OT)     PT Frequency: 5x OT Frequency: 5x            Contractures Contractures Info: Not present    Additional Factors Info  Code Status, Allergies Code Status Info: DNR Allergies Info: Aricept           Current Medications (09/16/2019):  This is the current hospital active medication list Current Facility-Administered Medications  Medication Dose Route Frequency Provider Last Rate Last Dose  . 0.9 %  sodium chloride infusion   Intravenous PRN Lavina Hamman, MD 10 mL/hr at 09/14/19 2200    . acetaminophen (TYLENOL) tablet 650 mg  650 mg Oral Q6H PRN Swinteck, Aaron Edelman, MD      . alum & mag hydroxide-simeth (MAALOX/MYLANTA) 200-200-20 MG/5ML suspension 30 mL  30 mL Oral Q6H PRN Swinteck, Aaron Edelman, MD      . amLODipine (NORVASC) tablet 10 mg  10 mg Oral Daily Rod Can, MD   10 mg at 09/16/19 0932  . busPIRone (BUSPAR) tablet 10 mg  10 mg Oral BID Rod Can, MD   10 mg at 09/16/19 0931  . cholecalciferol (VITAMIN D3) tablet 1,000 Units  1,000 Units Oral Daily Rod Can, MD  1,000 Units at 09/16/19 0932  . dextrose 5 % and 0.45 % NaCl with KCl 20 mEq/L infusion   Intravenous Continuous Lavina Hamman, MD 75 mL/hr at 09/16/19 0959    . docusate sodium (COLACE) capsule 100 mg  100 mg Oral BID Rod Can, MD   Stopped at 09/16/19 [phone]  . enoxaparin (LOVENOX) injection 40 mg  40 mg Subcutaneous Q24H Rod Can, MD   40 mg at 09/16/19 0931  . escitalopram (LEXAPRO) tablet 10 mg  10 mg Oral Daily Rod Can, MD   10 mg at 09/16/19 0931  . guaiFENesin (ROBITUSSIN) 100 MG/5ML  solution 200 mg  200 mg Oral Q6H PRN Swinteck, Aaron Edelman, MD      . hydrALAZINE (APRESOLINE) tablet 25 mg  25 mg Oral TID PRN Swinteck, Aaron Edelman, MD      . latanoprost (XALATAN) 0.005 % ophthalmic solution 1 drop  1 drop Both Eyes QHS Rod Can, MD   1 drop at 09/15/19 2251  . LORazepam (ATIVAN) tablet 0.5 mg  0.5 mg Oral Q6H PRN Swinteck, Aaron Edelman, MD      . losartan (COZAAR) tablet 100 mg  100 mg Oral Daily Rod Can, MD   100 mg at 09/16/19 0932  . loteprednol (LOTEMAX) 0.5 % ophthalmic suspension 1 drop  1 drop Both Eyes BID Rod Can, MD   1 drop at 09/16/19 0931  . magnesium hydroxide (MILK OF MAGNESIA) suspension 30 mL  30 mL Oral QHS PRN Swinteck, Aaron Edelman, MD      . menthol-cetylpyridinium (CEPACOL) lozenge 3 mg  1 lozenge Oral PRN Swinteck, Aaron Edelman, MD       Or  . phenol (CHLORASEPTIC) mouth spray 1 spray  1 spray Mouth/Throat PRN Swinteck, Aaron Edelman, MD      . methimazole (TAPAZOLE) tablet 5 mg  5 mg Oral Daily Rod Can, MD   5 mg at 09/16/19 0932  . methocarbamol (ROBAXIN) tablet 500 mg  500 mg Oral Q8H PRN Rod Can, MD   500 mg at 09/16/19 1424  . metoprolol succinate (TOPROL-XL) 24 hr tablet 25 mg  25 mg Oral Daily Rod Can, MD   25 mg at 09/16/19 0932  . morphine 2 MG/ML injection 0.5 mg  0.5 mg Intravenous Q4H PRN Rod Can, MD   0.5 mg at 09/15/19 2024  . ondansetron (ZOFRAN) tablet 4 mg  4 mg Oral Q6H PRN Swinteck, Aaron Edelman, MD       Or  . ondansetron (ZOFRAN) injection 4 mg  4 mg Intravenous Q6H PRN Swinteck, Aaron Edelman, MD      . oxyCODONE-acetaminophen (PERCOCET/ROXICET) 5-325 MG per tablet 1 tablet  1 tablet Oral Q4H PRN Rod Can, MD   1 tablet at 09/16/19 0606  . traZODone (DESYREL) tablet 50 mg  50 mg Oral QHS Rod Can, MD   50 mg at 09/15/19 2228     Discharge Medications: Please see discharge summary for a list of discharge medications.  Relevant Imaging Results:  Relevant Lab Results:   Additional  Information [id number]  Nila Nephew, LCSW

## 2019-09-16 NOTE — Progress Notes (Signed)
    Subjective:  No events.   Objective:   VITALS:   Vitals:   09/15/19 1445 09/15/19 2221 09/16/19 0438 09/16/19 0932  BP:  (!) 127/54 (!) 143/50 (!) 140/45  Pulse:  80 78 72  Resp:  18 16   Temp:  98.7 F (37.1 C) 98.6 F (37 C) 97.8 F (36.6 C)  TempSrc:  Axillary Oral Axillary  SpO2: 98% 100%  93%  Weight:        NAD, confused and agitated Intact pulses distally Dorsiflexion/Plantar flexion intact Incision: dressing C/D/I Refuses to follow commands Spontaneously moves her ankle and toes Unable to assess sensation due to MS  Lab Results  Component Value Date   WBC 9.3 09/16/2019   HGB 7.1 (L) 09/16/2019   HCT 22.9 (L) 09/16/2019   MCV 96.6 09/16/2019   PLT 235 09/16/2019   BMET    Component Value Date/Time   NA 138 09/16/2019 0249   K 3.9 09/16/2019 0249   CL 107 09/16/2019 0249   CO2 23 09/16/2019 0249   GLUCOSE 147 (H) 09/16/2019 0249   BUN 26 (H) 09/16/2019 0249   CREATININE 0.78 09/16/2019 0249   CALCIUM 8.5 (L) 09/16/2019 0249   GFRNONAA >60 09/16/2019 0249   GFRAA >60 09/16/2019 0249     Assessment/Plan: 2 Days Post-Op   Principal Problem:   Closed fracture of shaft of left femur (HCC) Active Problems:   Essential hypertension   Alzheimer disease (HCC)   Depression   Fall   Leukocytosis   Hyperthyroidism   TDWB LLE with walker DVT ppx: Lovenox, SCDs, TEDS PO pain control PT/OT Dispo: SNF placement   Lisa [name] 09/16/2019, 11:01 AM   Rod Can, MD Cell: [phone] [place] is now [facility]  [facility] [address], [place], [place] [address] Phone: [phone] [email or web address] Facebook  [employer]

## 2019-09-16 NOTE — Plan of Care (Signed)

## 2019-09-16 NOTE — Evaluation (Signed)
Physical Therapy Evaluation Patient Details Name: Lisa [name] MRN: [id number] DOB: 10-11-1928 Today's Date: 09/16/2019   History of Present Illness  Pt admitted s/p fall with spiral fx of L femoral shaft and now s/p ORIF  Clinical Impression  Pt admitted as above from memory care unit and presents with functional mobility limitations 2* generalized weakness, post op pain, balance deficits, dementia related cognitive deficits and blindness.  Pt would benefit from follow up rehab at SNF level to maximize IND and safety.    Follow Up Recommendations SNF    Equipment Recommendations  None recommended by PT    Recommendations for Other Services       Precautions / Restrictions Precautions Precautions: Fall Precaution Comments: Pt is blind  Restrictions Weight Bearing Restrictions: Yes LLE Weight Bearing: Partial weight bearing LLE Partial Weight Bearing Percentage or Pounds: 30%      Mobility  Bed Mobility Overal bed mobility: Needs Assistance Bed Mobility: Supine to Sit;Sit to Supine     Supine to sit: Total assist;+2 for physical assistance;+2 for safety/equipment Sit to supine: Total assist;+2 for physical assistance;+2 for safety/equipment   General bed mobility comments: Pad utilized to transition supine<>EOB sitting  Transfers                 General transfer comment: NT 2* pt anxiety and inability to follow cues for participation  Ambulation/Gait                Stairs            Wheelchair Mobility    Modified Rankin (Stroke Patients Only)       Balance Overall balance assessment: Needs assistance Sitting-balance support: Bilateral upper extremity supported;Feet supported Sitting balance-Leahy Scale: Poor Sitting balance - Comments: R and posterior lean                                     Pertinent Vitals/Pain Pain Assessment: Faces Faces Pain Scale: Hurts little more Pain Location: L leg Pain Descriptors /  Indicators: Grimacing;Guarding Pain Intervention(s): Limited activity within patient's tolerance;Monitored during session;Repositioned    Home Living Family/patient expects to be discharged to:: Skilled nursing facility                      Prior Function           Comments: Pt unable to provide hx 2* demented state     Hand Dominance        Extremity/Trunk Assessment   Upper Extremity Assessment Upper Extremity Assessment: Generalized weakness    Lower Extremity Assessment Lower Extremity Assessment: Generalized weakness;LLE deficits/detail LLE Deficits / Details: Ltd voluntary movement but resistant to AAROM or PROM    Cervical / Trunk Assessment Cervical / Trunk Assessment: Kyphotic  Communication   Communication: No difficulties  Cognition Arousal/Alertness: Awake/alert Behavior During Therapy: Anxious;Impulsive Overall Cognitive Status: History of cognitive impairments - at baseline                                        General Comments      Exercises     Assessment/Plan    PT Assessment Patient needs continued PT services  PT Problem List Decreased strength;Decreased range of motion;Decreased activity tolerance;Decreased balance;Decreased mobility;Decreased cognition;Decreased knowledge of use of DME;Decreased safety awareness;Pain;Decreased knowledge of precautions  PT Treatment Interventions DME instruction;Functional mobility training;Therapeutic activities;Therapeutic exercise;Patient/family education;Balance training;Cognitive remediation    PT Goals (Current goals can be found in the Care Plan section)  Acute Rehab PT Goals Patient Stated Goal: Please put me away PT Goal Formulation: Patient unable to participate in goal setting Time For Goal Achievement: 09/30/19 Potential to Achieve Goals: Poor    Frequency Min 2X/week   Barriers to discharge        Co-evaluation               AM-PAC PT "6  Clicks" Mobility  Outcome Measure Help needed turning from your back to your side while in a flat bed without using bedrails?: Total Help needed moving from lying on your back to sitting on the side of a flat bed without using bedrails?: Total Help needed moving to and from a bed to a chair (including a wheelchair)?: Total Help needed standing up from a chair using your arms (e.g., wheelchair or bedside chair)?: Total Help needed to walk in hospital room?: Total Help needed climbing 3-5 steps with a railing? : Total 6 Click Score: 6    End of Session   Activity Tolerance: Patient limited by pain;Other (comment)(and anxiety) Patient left: in bed;with call bell/phone within reach;with nursing/sitter in room Nurse Communication: Mobility status;Need for lift equipment PT Visit Diagnosis: Difficulty in walking, not elsewhere classified (R26.2);Pain;History of falling (Z91.81) Pain - Right/Left: Left Pain - part of body: Leg    Time: [id number] PT Time Calculation (min) (ACUTE ONLY): 18 min   Charges:   PT Evaluation $PT Eval Low Complexity: [place] Pager [phone] Office [place] 09/16/2019, 3:32 PM

## 2019-09-17 ENCOUNTER — Other Ambulatory Visit: Payer: Self-pay

## 2019-09-17 LAB — BASIC METABOLIC PANEL
Anion gap: 7 (ref 5–15)
BUN: 14 mg/dL (ref 8–23)
CO2: 23 mmol/L (ref 22–32)
Calcium: 8.5 mg/dL — ABNORMAL LOW (ref 8.9–10.3)
Chloride: 106 mmol/L (ref 98–111)
Creatinine, Ser: 0.65 mg/dL (ref 0.44–1.00)
GFR calc Af Amer: >60 mL/min (ref >60)
GFR calc non Af Amer: >60 mL/min (ref >60)
Glucose, Bld: 121 mg/dL — ABNORMAL HIGH (ref 70–99)
Potassium: 4.7 mmol/L (ref 3.5–5.1)
Sodium: 136 mmol/L (ref 135–145)

## 2019-09-17 LAB — CBC
HCT: 21.4 % — ABNORMAL LOW (ref 36.0–46.0)
Hemoglobin: 6.7 g/dL — CL (ref 12.0–15.0)
MCH: 30.2 pg (ref 26.0–34.0)
MCHC: 31.3 g/dL (ref 30.0–36.0)
MCV: 96.4 fL (ref 80.0–100.0)
Platelets: 269 10*3/uL (ref 150–400)
RBC: 2.22 MIL/uL — ABNORMAL LOW (ref 3.87–5.11)
RDW: 13.6 % (ref 11.5–15.5)
WBC: 8.3 10*3/uL (ref 4.0–10.5)
nRBC: 0 % (ref 0.0–0.2)

## 2019-09-17 LAB — PREPARE RBC (CROSSMATCH)

## 2019-09-17 LAB — HEMOGLOBIN AND HEMATOCRIT, BLOOD
HCT: 25.7 % — ABNORMAL LOW (ref 36.0–46.0)
Hemoglobin: 8.1 g/dL — ABNORMAL LOW (ref 12.0–15.0)

## 2019-09-17 MED ORDER — SODIUM CHLORIDE 0.9% IV SOLUTION
Freq: Once | INTRAVENOUS | Status: AC
  Administered 2019-09-17: 12:00:00 via INTRAVENOUS

## 2019-09-17 MED ORDER — ENSURE ENLIVE PO LIQD
237.0000 mL | Freq: Three times a day (TID) | ORAL | Status: DC
  Administered 2019-09-18 – 2019-09-20 (×9): 237 mL via ORAL

## 2019-09-17 NOTE — Progress Notes (Signed)
[facility] Progress Note  Patient: Lisa [name] [id number]   PCP: Patient, No Pcp Per DOB: [birth year]   DOA: 09/13/2019   DOS: 09/17/2019   Date of Service: the patient was seen and examined on 09/17/2019  Chief Complaint  Patient presents with  . Hip Pain  . Fall   Brief hospital course: Pt. with PMH of HTN, HLD, hypothyroidism, depression, PVD, dementia, under active hospice, glaucoma; presented with complain of fall, was found to have left distal femur shaft fracture. S/P ORIF 09/14/2019. Currently further plan is monitor for postoperative recovery.  Subjective: No acute complaint no nausea no vomiting.  Minimal oral intake remains.  No active bleeding identified.  Assessment and Plan: Closed fracture of shaft of left femur ([place]): As evidenced by x-ray.  Orthopedic surgeon, Dr. Claretta Fraise consulted.  Dr. Lyla Glassing consulted with patient's daughter who is the POA and family has decided to go with the surgery for now. Patient is at significant risk for poor surgical outcome secondary to her dementia.  Postop acute blood loss anemia. Hemoglobin dropped from 11.1 on admission 6.6 1 PRBC transfusion on 09/17/2019. Transfuse hemoglobin less than 7. No active external bleeding reported. Continue Lovenox for now as recommended by orthopedics.  Postop delirium. Patient is significantly confused.  Unable to take anything by mouth. Double to participate in care. We will minimize her psychotropic medications. CT head and C-spine on admissions were unremarkable.  Leukocytosis: Likely due to stress-induced demargination. Patient does not have signs of infection. Resolved.  Essential hypertension: -Losartan, metoprolol, amlodipine -As needed hydralazine orally  Alzheimer disease ([place]):  hasagitation and anxiety every day since hospitalization -Discontinue scheduled home Ativan, change to PRN Ativan  Depressionwith anxiety -Lexaproand ativan, currently on hold  secondary to excessive sleepiness  Fall: -pt/ot when able to  Hyperthyroidism: -On methimazole will hold for now.  Poor p.o. intake. Concerning. We will monitor for improvement in patient's mentation and eventually improvement in patient's oral intake.  Body mass index is 21.39 kg/m.  Nutrition Problem: Increased nutrient needs Etiology: acute illness, post-op healing Interventions: Interventions: Refer to RD note for recommendations      Diet: Regular diet DVT Prophylaxis: Subcutaneous Lovenox  Advance goals of care discussion: DNR, on hospice  Family Communication: no family was present at bedside, at the time of interview.  Disposition:  Discharge to be determined .  Consultants: Orthopedic, Dr. Lyla Glassing Procedures: ORIF 09/14/2019.  Scheduled Meds: . amLODipine  10 mg Oral Daily  . busPIRone  10 mg Oral BID  . cholecalciferol  1,000 Units Oral Daily  . docusate sodium  100 mg Oral BID  . enoxaparin (LOVENOX) injection  40 mg Subcutaneous Q24H  . escitalopram  10 mg Oral Daily  . latanoprost  1 drop Both Eyes QHS  . losartan  100 mg Oral Daily  . loteprednol  1 drop Both Eyes BID  . methimazole  5 mg Oral Daily  . metoprolol succinate  25 mg Oral Daily  . traZODone  50 mg Oral QHS   Continuous Infusions: . sodium chloride 10 mL/hr at 09/14/19 2200   PRN Meds: sodium chloride, acetaminophen **OR** [DISCONTINUED] acetaminophen, alum & mag hydroxide-simeth, guaiFENesin, hydrALAZINE, LORazepam, magnesium hydroxide, menthol-cetylpyridinium **OR** phenol, methocarbamol, morphine injection, ondansetron **OR** ondansetron (ZOFRAN) IV, oxyCODONE-acetaminophen Antibiotics: Anti-infectives (From admission, onward)   Start     Dose/Rate Route Frequency Ordered Stop   09/14/19 2100  ceFAZolin (ANCEF) IVPB 2g/100 mL premix     2 g 200 mL/hr over 30 Minutes Intravenous Every 6  hours 09/14/19 1951 09/15/19 0347   09/14/19 1200  ceFAZolin (ANCEF) IVPB 2g/100 mL premix      2 g 200 mL/hr over 30 Minutes Intravenous On call to O.R. 09/14/19 1153 09/14/19 1500       Objective: Physical Exam: Vitals:   09/17/19 0806 09/17/19 1125 09/17/19 1149 09/17/19 1430  BP: (!) 133/39 (!) 132/36 118/89 (!) 152/52  Pulse: 68 66 70 66  Resp:  15 15 16   Temp:  98 F (36.7 C) 97.8 F (36.6 C) 97.7 F (36.5 C)  TempSrc:  Axillary Axillary Axillary  SpO2:  94% 97% 95%  Weight:        Intake/Output Summary (Last 24 hours) at 09/17/2019 1755 Last data filed at 09/17/2019 1735 Gross per 24 hour  Intake 1460.08 ml  Output 1325 ml  Net 135.08 ml   Filed Weights   09/14/19 1318 09/14/19 1421  Weight: 60.1 kg 60.1 kg   General: lethargic and not oriented to time, place, and person. Appear in moderate distress, affect emotionally labile Eyes: PERRL, Conjunctiva normal ENT: Oral Mucosa Clear, dry  Neck: difficult to assess  JVD, no Abnormal Mass Or lumps Cardiovascular: S1 and S2 Present, no Murmur, peripheral pulses symmetrical Respiratory: good respiratory effort, Bilateral Air entry equal and Decreased, no signs of accessory muscle use, Clear to Auscultation, no Crackles, no wheezes Abdomen: Bowel Sound present, Soft and difficult to assess  tenderness, no hernia Skin: no rashes  Extremities: no Pedal edema, no calf tenderness Neurologic: without any new focal findings although examination is highly limited secondary to patient's poor cooperation. Gait not checked due to patient safety concerns  Data Reviewed: I have personally reviewed and interpreted daily labs, tele strips, imagings as discussed above. I reviewed all nursing notes, pharmacy notes, vitals, pertinent old records I have discussed plan of care as described above with RN and patient/family.  CBC: Recent Labs  Lab 09/13/19 2228  09/14/19 1849 09/15/19 0305 09/16/19 0249 09/16/19 1117 09/17/19 0741 09/17/19 1528  WBC 11.0*   < > 15.5* 10.9* 9.3 11.1* 8.3  --   NEUTROABS 8.7*  --   --   --    --  6.1  --   --   HGB 11.1*   < > 10.3* 8.4* 7.1* 7.4* 6.7* 8.1*  HCT 34.2*   < > 31.1* 25.8* 22.9* 23.2* 21.4* 25.7*  MCV 91.7   < > 90.7 92.1 96.6 92.8 96.4  --   PLT 315   < > 340 269 235 190 269  --    < > = values in this interval not displayed.   Basic Metabolic Panel: Recent Labs  Lab 09/13/19 2228 09/14/19 0118 09/14/19 1849 09/15/19 0305 09/16/19 0249 09/17/19 0741  NA 140 137  --  134* 138 136  K 4.4 4.1  --  4.5 3.9 4.7  CL 105 106  --  103 107 106  CO2 24 22  --  22 23 23   GLUCOSE 131* 138*  --  161* 147* 121*  BUN 33* 30*  --  28* 26* 14  CREATININE 0.89 0.78 0.75 0.81 0.78 0.65  CALCIUM 9.4 9.1  --  8.2* 8.5* 8.5*    Liver Function Tests: No results for input(s): AST, ALT, ALKPHOS, BILITOT, PROT, ALBUMIN in the last 168 hours. No results for input(s): LIPASE, AMYLASE in the last 168 hours. No results for input(s): AMMONIA in the last 168 hours. Coagulation Profile: Recent Labs  Lab 09/14/19 0118  INR 1.0  Cardiac Enzymes: No results for input(s): CKTOTAL, CKMB, CKMBINDEX, TROPONINI in the last 168 hours. BNP (last 3 results) No results for input(s): PROBNP in the last 8760 hours. CBG: No results for input(s): GLUCAP in the last 168 hours. Studies: No results found.   Time spent: 35 minutes  Author: Berle Mull, MD [facility] 09/17/2019 5:55 PM  To reach On-call, see care teams to locate the attending and reach out to them via [email or web address]. If 7PM-7AM, please contact night-coverage If you still have difficulty reaching the attending provider, please page the [facility] (Director on Call) for [facility] on amion for assistance.

## 2019-09-17 NOTE — Progress Notes (Signed)
    Subjective:  No events.   Objective:   VITALS:   Vitals:   09/16/19 0932 09/16/19 2152 09/17/19 0622 09/17/19 0806  BP: (!) 140/45 (!) 114/39 (!) 130/46 (!) 133/39  Pulse: 72 66 70 68  Resp:  16 16   Temp: 97.8 F (36.6 C) 98.4 F (36.9 C) 98 F (36.7 C)   TempSrc: Axillary Axillary Axillary   SpO2: 93%  96%   Weight:        NAD, confused and agitated Intact pulses distally Dorsiflexion/Plantar flexion intact Incision: dressing C/D/I Refuses to follow commands Spontaneously moves her ankle and toes Unable to assess sensation due to MS  Lab Results  Component Value Date   WBC 8.3 09/17/2019   HGB 6.7 (LL) 09/17/2019   HCT 21.4 (L) 09/17/2019   MCV 96.4 09/17/2019   PLT 269 09/17/2019   BMET    Component Value Date/Time   NA 136 09/17/2019 0741   K 4.7 09/17/2019 0741   CL 106 09/17/2019 0741   CO2 23 09/17/2019 0741   GLUCOSE 121 (H) 09/17/2019 0741   BUN 14 09/17/2019 0741   CREATININE 0.65 09/17/2019 0741   CALCIUM 8.5 (L) 09/17/2019 0741   GFRNONAA >60 09/17/2019 0741   GFRAA >60 09/17/2019 0741     Assessment/Plan: 3 Days Post-Op   Principal Problem:   Closed fracture of shaft of left femur (HCC) Active Problems:   Essential hypertension   Alzheimer disease (HCC)   Depression   Fall   Leukocytosis   Hyperthyroidism   TDWB LLE with walker DVT ppx: Lovenox, SCDs, TEDS PO pain control PT/OT ABLA: hgb 6.7, per hospitalist Dispo: SNF placement   Hilton Cork Mahari Strahm 09/17/2019, 10:02 AM   Rod Can, MD Cell: [phone] [place] is now [facility]  [facility] [address], [address], [place], [place] [address] Phone: [phone] [email or web address] Facebook  [employer]

## 2019-09-17 NOTE — Progress Notes (Signed)
The pt's Daughter, Juliann Pulse, gave consent for the pt to receive blood. Riley Kill, RN witnessed the consent via telephone.

## 2019-09-17 NOTE — Progress Notes (Signed)
CRITICAL VALUE ALERT  Critical Value:  Hgb 6.7  Date & Time Notied:  09/17/2019 [id number]  Provider Notified: Berle Mull, MD

## 2019-09-18 DIAGNOSIS — R410 Disorientation, unspecified: Secondary | ICD-10-CM

## 2019-09-18 LAB — BPAM RBC
Blood Product Expiration Date: 202010192359
ISSUE DATE / TIME: 202010031129
Unit Type and Rh: 600

## 2019-09-18 LAB — BASIC METABOLIC PANEL
Anion gap: 5 (ref 5–15)
BUN: 14 mg/dL (ref 8–23)
CO2: 26 mmol/L (ref 22–32)
Calcium: 9.1 mg/dL (ref 8.9–10.3)
Chloride: 108 mmol/L (ref 98–111)
Creatinine, Ser: 0.63 mg/dL (ref 0.44–1.00)
GFR calc Af Amer: >60 mL/min (ref >60)
GFR calc non Af Amer: >60 mL/min (ref >60)
Glucose, Bld: 92 mg/dL (ref 70–99)
Potassium: 4.8 mmol/L (ref 3.5–5.1)
Sodium: 139 mmol/L (ref 135–145)

## 2019-09-18 LAB — CBC
HCT: 29.3 % — ABNORMAL LOW (ref 36.0–46.0)
Hemoglobin: 9.5 g/dL — ABNORMAL LOW (ref 12.0–15.0)
MCH: 29.6 pg (ref 26.0–34.0)
MCHC: 32.4 g/dL (ref 30.0–36.0)
MCV: 91.3 fL (ref 80.0–100.0)
Platelets: 325 10*3/uL (ref 150–400)
RBC: 3.21 MIL/uL — ABNORMAL LOW (ref 3.87–5.11)
RDW: 14.1 % (ref 11.5–15.5)
WBC: 8.2 10*3/uL (ref 4.0–10.5)
nRBC: 0 % (ref 0.0–0.2)

## 2019-09-18 LAB — TYPE AND SCREEN
ABO/RH(D): A NEG
Antibody Screen: NEGATIVE
Unit division: 0

## 2019-09-18 NOTE — Progress Notes (Signed)
[place]   Received request from [place] for family interest in [place]. Chart reviewed and spoke with daughter Juliann Pulse by phone to acknowledge referral and make aware [facility] is not able to offer a room this afternoon. Juliann Pulse is aware an [facility] hospital liaison will provide updates tomorrow.   Thank you,  Erling Conte, LCSW [phone]  [facility] liaisons are listed daily on AMION under Hospice and [place].

## 2019-09-18 NOTE — TOC Progression Note (Signed)
Transition of Care [facility]) - Progression Note    Patient Details  Name: Lisa [name] MRN: [id number] Date of Birth: [birth year]  [facility] CM/SW Contact  Servando Snare, [place] Phone Number: 09/18/2019, 4:24 PM  Clinical Narrative:   Per attending patient is now at hospice level of care. Patient was previously home with hospice at facility ([facility]) family revoked services so patient could have surgery. Attending believes patient would qualify for residential. Patients daughter Juliann Pulse stated she was unsure if [facility] had residential based on a previous conversation. She expressed that she would like the [facility] facility that could do both if needed. Daughter apologized for unclear thinking as she has been given a lot of information. LCSW made [place] referral to assist family with making best decision for patient.     Expected Discharge Plan: [place] Barriers to Discharge: No Barriers Identified  Expected Discharge Plan and Services Expected Discharge Plan: [place] In-house Referral: Clinical Social Work Discharge Planning Services: CM Consult Post Acute Care Choice: Nursing Home, [place] Living arrangements for the past 2 months: (Memory Care)                                       Social Determinants of Health (SDOH) Interventions    Readmission Risk Interventions No flowsheet data found.

## 2019-09-18 NOTE — Progress Notes (Addendum)
PROGRESS NOTE    Korra Pankow  [id number] DOB: 04/05/1928 DOA: 09/13/2019 PCP: Patient, No Pcp Per      Brief Narrative:  Mrs. Fosnight is a 85 y.o. F with advanced dementia, in memory care, under hospice, per report, blindness, hyperthyroidism and HTN who presented with unwitnessed fall and leg pain.  In the ER, found to have BP 205/158 mmHg, radiograph showing left distal femur fracture.       Assessment & Plan:  Femur fracture S/p internal fixation on 9/30 by Dr. Delfino Lovett. Failure to thrive Patient was on Hospice prior to admission for dementia.  This was revoked for operation.    Post-operative course has been complicated by blood loss anemia requiring transfusion, and delirium and failure to thrive (she had lost 30 lbs before admission, and since surgery has had inadequate oral intake of fluids calories to maintain hydration and healing).  Famiyl agree she would not want to prolong life artificially with fluids and feeding tube. At this time, I agree with Dr. Posey Pronto, and recommend we continue pain control, supportive care, but we do not start IV fluids, push NG tube for nutrition, and we contact Hospice to make arrangements for discharge either to residential hospice, or back to ALF with Hospice following.  -Consult to social work for coordinating Hospice at discharge     Dementia Delirium The patient is oriented to self only at baseline.  Per family, she knows family, but not oriented to location or year or current events, can only respond to conversations, not initiate them, can make needs known, but has been losing weight in last 6 months, was enrolled in Hospice with [facility] a few months ago.  Here, nursing report she is labile of attention.  At times able to make needs known but confused, other times agitated at other times somnolent.     Has to be fed.  The most she has eaten last 48 hours is one applesauce and part of Ensure.  Delirium  precautions:   -Lights and TV off, minimize interruptions at night  -Blinds open and lights on during day  -Glasses/hearing aid with patient  -Frequent reorientation  -PT/OT when able  -Avoid sedation medications/Beers list medications including Ativan   Hypertension BP mostly controlled -Continue amlodipine, losartan, metoprolol  Hyperthyroidism -Continue methimazole  Anxiety -Continue Buspar -Hold Ativan -Continue escitalopram -Continue trazodone  Acute on chronic anemia Post-op blood loss anemia.  Transfused 1 unit on 10/3.          MDM and disposition: The below labs and imaging reports were reviewed and summarized above.  Medication management as above.  The patient was admitted with femur fracture.  Her post-operative course has been complicated by agitation and delirium.    We will ask social work to begin arranging Hospice, either residential or at [place].       DVT prophylaxis: Lovenox Code Status: DO NOT RESUSCITATE Family Communication: Daughter at the bedside    Consultants:   Orthopedics  Procedures:   9/30 Internal fixation femur    Subjective: Agitated and combative earlier today, now sleeping after pain medicine.  No respiratory distress, fever, complaints of pain, focal complaints.  Objective: Vitals:   09/17/19 2043 09/17/19 2100 09/18/19 0445 09/18/19 0836  BP: (!) 144/48  (!) 125/46 (!) 146/50  Pulse: 64  61 68  Resp: 18  18   Temp: 97.8 F (36.6 C)  97.6 F (36.4 C)   TempSrc:      SpO2:  Weight:  62.4 kg    Height:  5\' 6"  (1.676 m)      Intake/Output Summary (Last 24 hours) at 09/18/2019 1440 Last data filed at 09/18/2019 1228 Gross per 24 hour  Intake 890 ml  Output 1150 ml  Net -260 ml   Filed Weights   09/14/19 1318 09/14/19 1421 09/17/19 2100  Weight: 60.1 kg 60.1 kg 62.4 kg    Examination: General appearance: Frail elderly adult female, sleeping, difficult to rouse after pain medications   HEENT:  Anicteric, conjunctiva chemotic, lids and lashes normal. No nasal deformity, discharge, epistaxis.  Lips dry, does not open mouth, unable to assess hearing.   Skin: Warm and dry.  No jaundice.  Normal skin for age. Cardiac: RRR, nl S1-S2, no murmurs appreciated.  Capillary refill is brisk.  JVP normal.  No LE edema.  Radial pulses 2+ and symmetric. Respiratory: Normal respiratory rate and rhythm.  CTAB without rales or wheezes. Abdomen: Abdomen soft.  No grimace to palpation or rigidity. No ascites, distension, hepatosplenomegaly.   MSK: No deformities or effusions.  Diffuse loss of subcutaneous muscle mass and fat Neuro: Asleep, does not awake or follow commands   Psych:Unable to assess.    Data Reviewed: I have personally reviewed following labs and imaging studies:  CBC: Recent Labs  Lab 09/13/19 2228  09/15/19 0305 09/16/19 0249 09/16/19 1117 09/17/19 0741 09/17/19 1528 09/18/19 0235  WBC 11.0*   < > 10.9* 9.3 11.1* 8.3  --  8.2  NEUTROABS 8.7*  --   --   --  6.1  --   --   --   HGB 11.1*   < > 8.4* 7.1* 7.4* 6.7* 8.1* 9.5*  HCT 34.2*   < > 25.8* 22.9* 23.2* 21.4* 25.7* 29.3*  MCV 91.7   < > 92.1 96.6 92.8 96.4  --  91.3  PLT 315   < > 269 235 190 269  --  325   < > = values in this interval not displayed.   Basic Metabolic Panel: Recent Labs  Lab 09/14/19 0118 09/14/19 1849 09/15/19 0305 09/16/19 0249 09/17/19 0741 09/18/19 0235  NA 137  --  134* 138 136 139  K 4.1  --  4.5 3.9 4.7 4.8  CL 106  --  103 107 106 108  CO2 22  --  22 23 23 26   GLUCOSE 138*  --  161* 147* 121* 92  BUN 30*  --  28* 26* 14 14  CREATININE 0.78 0.75 0.81 0.78 0.65 0.63  CALCIUM 9.1  --  8.2* 8.5* 8.5* 9.1   GFR: Estimated Creatinine Clearance: 44.6 mL/min (by C-G formula based on SCr of 0.63 mg/dL). Liver Function Tests: No results for input(s): AST, ALT, ALKPHOS, BILITOT, PROT, ALBUMIN in the last 168 hours. No results for input(s): LIPASE, AMYLASE in the last 168 hours. No results  for input(s): AMMONIA in the last 168 hours. Coagulation Profile: Recent Labs  Lab 09/14/19 0118  INR 1.0   Cardiac Enzymes: No results for input(s): CKTOTAL, CKMB, CKMBINDEX, TROPONINI in the last 168 hours. BNP (last 3 results) No results for input(s): PROBNP in the last 8760 hours. HbA1C: No results for input(s): HGBA1C in the last 72 hours. CBG: No results for input(s): GLUCAP in the last 168 hours. Lipid Profile: No results for input(s): CHOL, HDL, LDLCALC, TRIG, CHOLHDL, LDLDIRECT in the last 72 hours. Thyroid Function Tests: No results for input(s): TSH, T4TOTAL, FREET4, T3FREE, THYROIDAB in the last 72 hours. Anemia  Panel: No results for input(s): VITAMINB12, FOLATE, FERRITIN, TIBC, IRON, RETICCTPCT in the last 72 hours. Urine analysis:    Component Value Date/Time   COLORURINE YELLOW 09/14/2019 0044   APPEARANCEUR HAZY (A) 09/14/2019 0044   LABSPEC 1.011 09/14/2019 0044   PHURINE 7.0 09/14/2019 0044   GLUCOSEU NEGATIVE 09/14/2019 0044   GLUCOSEU NEGATIVE 09/04/2016 0910   HGBUR SMALL (A) 09/14/2019 0044   BILIRUBINUR NEGATIVE 09/14/2019 0044   KETONESUR NEGATIVE 09/14/2019 0044   PROTEINUR 30 (A) 09/14/2019 0044   UROBILINOGEN 0.2 09/04/2016 0910   NITRITE POSITIVE (A) 09/14/2019 0044   LEUKOCYTESUR TRACE (A) 09/14/2019 0044   Sepsis Labs: @LABRCNTIP (procalcitonin:4,lacticacidven:4)  ) Recent Results (from the past 240 hour(s))  SARS Coronavirus 2 [facility] order, Performed in [facility] hospital lab) Nasopharyngeal Nasopharyngeal Swab     Status: None   Collection Time: 09/13/19 10:32 PM   Specimen: Nasopharyngeal Swab  Result Value Ref Range Status   SARS Coronavirus 2 NEGATIVE NEGATIVE Final    Comment: (NOTE) If result is NEGATIVE SARS-CoV-2 target nucleic acids are NOT DETECTED. The SARS-CoV-2 RNA is generally detectable in upper and lower  respiratory specimens during the acute phase of infection. The lowest  concentration of SARS-CoV-2 viral  copies this assay can detect is 250  copies / mL. A negative result does not preclude SARS-CoV-2 infection  and should not be used as the sole basis for treatment or other  patient management decisions.  A negative result may occur with  improper specimen collection / handling, submission of specimen other  than nasopharyngeal swab, presence of viral mutation(s) within the  areas targeted by this assay, and inadequate number of viral copies  (<250 copies / mL). A negative result must be combined with clinical  observations, patient history, and epidemiological information. If result is POSITIVE SARS-CoV-2 target nucleic acids are DETECTED. The SARS-CoV-2 RNA is generally detectable in upper and lower  respiratory specimens dur ing the acute phase of infection.  Positive  results are indicative of active infection with SARS-CoV-2.  Clinical  correlation with patient history and other diagnostic information is  necessary to determine patient infection status.  Positive results do  not rule out bacterial infection or co-infection with other viruses. If result is PRESUMPTIVE POSTIVE SARS-CoV-2 nucleic acids MAY BE PRESENT.   A presumptive positive result was obtained on the submitted specimen  and confirmed on repeat testing.  While 2019 novel coronavirus  (SARS-CoV-2) nucleic acids may be present in the submitted sample  additional confirmatory testing may be necessary for epidemiological  and / or clinical management purposes  to differentiate between  SARS-CoV-2 and other Sarbecovirus currently known to infect humans.  If clinically indicated additional testing with an alternate test  methodology [phone]) is advised. The SARS-CoV-2 RNA is generally  detectable in upper and lower respiratory sp ecimens during the acute  phase of infection. The expected result is Negative. Fact Sheet for Patients:  StrictlyIdeas.no Fact Sheet for Healthcare Providers:  BankingDealers.co.za This test is not yet approved or cleared by the [place] FDA and has been authorized for detection and/or diagnosis of SARS-CoV-2 by FDA under an Emergency Use Authorization (EUA).  This EUA will remain in effect (meaning this test can be used) for the duration of the COVID-19 declaration under Section 564(b)(1) of the Act, 21 U.S.C. section 360bbb-3(b)(1), unless the authorization is terminated or revoked sooner. Performed at [facility], [place] [address], [place], [place] [address]   Surgical pcr screen  Status: Abnormal   Collection Time: 09/14/19 12:14 PM   Specimen: Nasal Mucosa; Nasal Swab  Result Value Ref Range Status   MRSA, PCR POSITIVE (A) NEGATIVE Final    Comment: RESULT CALLED TO, READ BACK BY AND VERIFIED WITH:  DANA FRANKLIN 09/14/19 1428 JM    Staphylococcus aureus POSITIVE (A) NEGATIVE Final    Comment: (NOTE) The Xpert SA Assay (FDA approved for NASAL specimens in patients 58 years of age and older), is one component of a comprehensive surveillance program. It is not intended to diagnose infection nor to guide or monitor treatment. Performed at [facility], [place] [address], [place], [place] [address]          Radiology Studies: No results found.      Scheduled Meds: . amLODipine  10 mg Oral Daily  . busPIRone  10 mg Oral BID  . cholecalciferol  1,000 Units Oral Daily  . docusate sodium  100 mg Oral BID  . enoxaparin (LOVENOX) injection  40 mg Subcutaneous Q24H  . escitalopram  10 mg Oral Daily  . feeding supplement (ENSURE ENLIVE)  237 mL Oral TID BM  . latanoprost  1 drop Both Eyes QHS  . losartan  100 mg Oral Daily  . loteprednol  1 drop Both Eyes BID  . methimazole  5 mg Oral Daily  . metoprolol succinate  25 mg Oral Daily  . traZODone  50 mg Oral QHS   Continuous Infusions: . sodium chloride 10 mL/hr at 09/14/19 2200     LOS: 5 days    Time  spent: 25 minutes    Edwin Dada, MD [facility] 09/18/2019, 2:40 PM     Please page through [place]:  [email or web address] Password [email or web address] If 7PM-7AM, please contact night-coverage

## 2019-09-19 LAB — BASIC METABOLIC PANEL
Anion gap: 8 (ref 5–15)
BUN: 28 mg/dL — ABNORMAL HIGH (ref 8–23)
CO2: 27 mmol/L (ref 22–32)
Calcium: 8.9 mg/dL (ref 8.9–10.3)
Chloride: 103 mmol/L (ref 98–111)
Creatinine, Ser: 0.68 mg/dL (ref 0.44–1.00)
GFR calc Af Amer: >60 mL/min (ref >60)
GFR calc non Af Amer: >60 mL/min (ref >60)
Glucose, Bld: 111 mg/dL — ABNORMAL HIGH (ref 70–99)
Potassium: 4 mmol/L (ref 3.5–5.1)
Sodium: 138 mmol/L (ref 135–145)

## 2019-09-19 LAB — CBC
HCT: 29 % — ABNORMAL LOW (ref 36.0–46.0)
Hemoglobin: 9.2 g/dL — ABNORMAL LOW (ref 12.0–15.0)
MCH: 29.7 pg (ref 26.0–34.0)
MCHC: 31.7 g/dL (ref 30.0–36.0)
MCV: 93.5 fL (ref 80.0–100.0)
Platelets: 400 10*3/uL (ref 150–400)
RBC: 3.1 MIL/uL — ABNORMAL LOW (ref 3.87–5.11)
RDW: 13.8 % (ref 11.5–15.5)
WBC: 9.6 10*3/uL (ref 4.0–10.5)
nRBC: 0 % (ref 0.0–0.2)

## 2019-09-19 NOTE — Progress Notes (Signed)
[facility] does not have any [facility] today.  Will update family.  Will update if bed availability changes.  Venia Carbon RN, BSN, [facility] Liaison (in [place]

## 2019-09-19 NOTE — Progress Notes (Signed)
PT Cancellation Note  Patient Details Name: Lisa [name] MRN: [id number] DOB: 03/25/1928   Cancelled Treatment:     Per chart review, pt plans to D/C to [facility] when a bed is available.     Rica Koyanagi  PTA Acute  Rehabilitation Services Pager      [phone] Office      [phone]

## 2019-09-19 NOTE — Progress Notes (Signed)
PROGRESS NOTE  Lisa [name]  DOB: 1928-12-15  PCP: Patient, No Pcp Per ED:[id number]  DOA: 09/13/2019  LOS: 6 days   Brief narrative: Lisa [name] is a 85 y.o. F with advanced dementia, in memory care, under hospice, per report, blindness, hyperthyroidism and HTN who presented with unwitnessed fall and leg pain.  In the ER, she was found to have BP 205/158 mmHg, radiograph showing left distal femur fracture.  Subjective: Patient was seen and examined this morning.  Pleasant elderly female, demented, not in pain.  Assessment/Plan: Femur fracture s/p internal fixation on 9/30 by Dr. Delfino Lovett. Failure to thrive Patient was on Hospice prior to admission for dementia.  This was revoked for operation.    Post-operative course has been complicated by blood loss anemia requiring transfusion, and delirium and failure to thrive (she had lost 30 lbs before admission, and since surgery has had inadequate oral intake of fluids calories to maintain hydration and healing). Family agreed she would not want to prolong life artificially with fluids and feeding tube. At this time, we recommend to continue pain control, supportive care, but we do not start IV fluids, push NG tube for nutrition, and we contact Hospice to make arrangements for discharge either to residential hospice, or back to ALF with Hospice following.  -Consult to social work for coordinating Hospice at discharge  Dementia Delirium The patient is oriented to self only at baseline.  Per family, she knows family, but not oriented to location or year or current events, can only respond to conversations, not initiate them, can make needs known, but has been losing weight in last 6 months, was enrolled in Hospice with [facility] a few months ago.  Here, nursing report she is labile of attention.  At times able to make needs known but confused, other times agitated at other times somnolent.     Has to be fed.  The most she  has eaten last 48 hours is one applesauce and part of Ensure.  Delirium precautions:              -Lights and TV off, minimize interruptions at night             -Blinds open and lights on during day             -Glasses/hearing aid with patient             -Frequent reorientation             -PT/OT when able             -Avoid sedation medications/Beers list medications including Ativan   Hypertension BP mostly controlled -Continue amlodipine, losartan, metoprolol  Hyperthyroidism -Continue methimazole  Anxiety -Continue Buspar -Hold Ativan -Continue escitalopram -Continue trazodone  Acute on chronic anemia Post-op blood loss anemia.  Transfused 1 unit on 10/3.  Mobility: Hospice patient DVT prophylaxis: Lovenox Code Status:   Code Status: DNR  Family Communication:  Expected Discharge: when hospice bed is available  Consultants:    Procedures:    Antimicrobials: Anti-infectives (From admission, onward)   Start     Dose/Rate Route Frequency Ordered Stop   09/14/19 2100  ceFAZolin (ANCEF) IVPB 2g/100 mL premix     2 g 200 mL/hr over 30 Minutes Intravenous Every 6 hours 09/14/19 1951 09/15/19 0347   09/14/19 1200  ceFAZolin (ANCEF) IVPB 2g/100 mL premix     2 g 200 mL/hr over 30 Minutes Intravenous On call to O.R. 09/14/19 1153 09/14/19  1500      Diet Order            Diet regular Room service appropriate? Yes; Fluid consistency: Thin  Diet effective now              Infusions:  . sodium chloride 10 mL/hr at 09/14/19 2200    Scheduled Meds: . amLODipine  10 mg Oral Daily  . busPIRone  10 mg Oral BID  . cholecalciferol  1,000 Units Oral Daily  . docusate sodium  100 mg Oral BID  . enoxaparin (LOVENOX) injection  40 mg Subcutaneous Q24H  . escitalopram  10 mg Oral Daily  . feeding supplement (ENSURE ENLIVE)  237 mL Oral TID BM  . latanoprost  1 drop Both Eyes QHS  . losartan  100 mg Oral Daily  . loteprednol  1 drop Both Eyes BID  .  methimazole  5 mg Oral Daily  . metoprolol succinate  25 mg Oral Daily  . traZODone  50 mg Oral QHS    PRN meds: sodium chloride, acetaminophen **OR** [DISCONTINUED] acetaminophen, alum & mag hydroxide-simeth, guaiFENesin, hydrALAZINE, magnesium hydroxide, menthol-cetylpyridinium **OR** phenol, methocarbamol, morphine injection, ondansetron **OR** ondansetron (ZOFRAN) IV, oxyCODONE-acetaminophen   Objective: Vitals:   09/19/19 0605 09/19/19 1042  BP: (!) 153/47 (!) 162/67  Pulse: 78 94  Resp: 16 18  Temp: (!) 97.5 F (36.4 C) 98.4 F (36.9 C)  SpO2: 96%     Intake/Output Summary (Last 24 hours) at 09/19/2019 1400 Last data filed at 09/19/2019 0605 Gross per 24 hour  Intake 240 ml  Output 570 ml  Net -330 ml   Filed Weights   09/14/19 1318 09/14/19 1421 09/17/19 2100  Weight: 60.1 kg 60.1 kg 62.4 kg   Weight change:  Body mass index is 22.2 kg/m.   Physical Exam: General exam: Appears calm and comfortable.  Skin: No rashes, lesions or ulcers. HEENT: Atraumatic, normocephalic, supple neck, no obvious bleeding Lungs: Clear to auscultation bilaterally CVS: Regular rate and rhythm GI/Abd soft, nondistended, nontender, bowel sound present CNS: Alert, awake, able to answer simple questions, not restless Psychiatry: Mood appropriate Extremities: No pedal edema, no calf tenderness  Data Review: I have personally reviewed the laboratory data and studies available.  Recent Labs  Lab 09/13/19 2228  09/16/19 0249 09/16/19 1117 09/17/19 0741 09/17/19 1528 09/18/19 0235 09/19/19 0311  WBC 11.0*   < > 9.3 11.1* 8.3  --  8.2 9.6  NEUTROABS 8.7*  --   --  6.1  --   --   --   --   HGB 11.1*   < > 7.1* 7.4* 6.7* 8.1* 9.5* 9.2*  HCT 34.2*   < > 22.9* 23.2* 21.4* 25.7* 29.3* 29.0*  MCV 91.7   < > 96.6 92.8 96.4  --  91.3 93.5  PLT 315   < > 235 190 269  --  325 400   < > = values in this interval not displayed.   Recent Labs  Lab 09/15/19 0305 09/16/19 0249 09/17/19 0741  09/18/19 0235 09/19/19 0311  NA 134* 138 136 139 138  K 4.5 3.9 4.7 4.8 4.0  CL 103 107 106 108 103  CO2 22 23 23 26 27   GLUCOSE 161* 147* 121* 92 111*  BUN 28* 26* 14 14 28*  CREATININE 0.81 0.78 0.65 0.63 0.68  CALCIUM 8.2* 8.5* 8.5* 9.1 8.9    Lisa Croak, MD  [facility] 09/19/2019

## 2019-09-20 ENCOUNTER — Encounter (HOSPITAL_COMMUNITY): Payer: Self-pay | Admitting: Orthopedic Surgery

## 2019-09-20 LAB — CBC
HCT: 28.3 % — ABNORMAL LOW (ref 36.0–46.0)
Hemoglobin: 8.9 g/dL — ABNORMAL LOW (ref 12.0–15.0)
MCH: 29.1 pg (ref 26.0–34.0)
MCHC: 31.4 g/dL (ref 30.0–36.0)
MCV: 92.5 fL (ref 80.0–100.0)
Platelets: 414 10*3/uL — ABNORMAL HIGH (ref 150–400)
RBC: 3.06 MIL/uL — ABNORMAL LOW (ref 3.87–5.11)
RDW: 13.8 % (ref 11.5–15.5)
WBC: 8.7 10*3/uL (ref 4.0–10.5)
nRBC: 0 % (ref 0.0–0.2)

## 2019-09-20 LAB — BASIC METABOLIC PANEL
Anion gap: 9 (ref 5–15)
BUN: 31 mg/dL — ABNORMAL HIGH (ref 8–23)
CO2: 27 mmol/L (ref 22–32)
Calcium: 8.9 mg/dL (ref 8.9–10.3)
Chloride: 104 mmol/L (ref 98–111)
Creatinine, Ser: 0.64 mg/dL (ref 0.44–1.00)
GFR calc Af Amer: >60 mL/min (ref >60)
GFR calc non Af Amer: >60 mL/min (ref >60)
Glucose, Bld: 116 mg/dL — ABNORMAL HIGH (ref 70–99)
Potassium: 4.8 mmol/L (ref 3.5–5.1)
Sodium: 140 mmol/L (ref 135–145)

## 2019-09-20 NOTE — Progress Notes (Signed)
Nutrition Brief Note  Patient assessed by this RD on 9/30. She has advanced dementia and is a/o to self only. Chart reviewed. Patient transitioning to comfort care with plan for discharge to [facility] once a bed becomes available.  No further nutrition interventions warranted at this time.  Please re-consult as needed.      Jarome Matin, MS, RD, LDN, [facility] Inpatient Clinical Dietitian Pager # [phone] After hours/weekend pager # [phone]

## 2019-09-20 NOTE — Progress Notes (Signed)
PROGRESS NOTE  Lisa [name]  DOB: 1928/01/24  PCP: Patient, No Pcp Per ED:[id number]  DOA: 09/13/2019  LOS: 7 days   Brief narrative: Lisa [name] is a 85 y.o. F with advanced dementia, in memory care, under hospice, per report, blindness, hyperthyroidism and HTN who presented with unwitnessed fall and leg pain.  In the ER, she was found to have BP 205/158 mmHg, radiograph showing left distal femur fracture.  Subjective: Patient was seen and examined this morning.  Pleasant elderly female, demented, not in pain.  Assessment/Plan: Femur fracture s/p internal fixation on 9/30 by Dr. Delfino Lovett. Failure to thrive Patient was on Hospice prior to admission for dementia.  This was revoked for operation.    Post-operative course has been complicated by blood loss anemia requiring transfusion, and delirium and failure to thrive (she had lost 30 lbs before admission, and since surgery has had inadequate oral intake of fluids calories to maintain hydration and healing). Family agreed she would not want to prolong life artificially with fluids and feeding tube. At this time, we recommend to continue pain control, supportive care, but we do not start IV fluids, push NG tube for nutrition, and we contact Hospice to make arrangements for discharge either to residential hospice, or back to ALF with Hospice following.  -Consult to social work for coordinating Hospice at discharge  Dementia Delirium The patient is oriented to self only at baseline.  Per family, she knows family, but not oriented to location or year or current events, can only respond to conversations, not initiate them, can make needs known, but has been losing weight in last 6 months, was enrolled in Hospice with [facility] a few months ago.  Here, nursing report she is labile of attention. At times able to make needs known but confused, other times agitated at other times somnolent.     Has to be fed.  The most she  has eaten last 48 hours is one applesauce and part of Ensure.  Delirium precautions:              -Lights and TV off, minimize interruptions at night             -Blinds open and lights on during day             -Glasses/hearing aid with patient             -Frequent reorientation             -PT/OT when able             -Avoid sedation medications/Beers list medications including Ativan   Hypertension BP mostly controlled -Continue amlodipine, losartan, metoprolol  Hyperthyroidism -Continue methimazole  Anxiety -Continue Buspar -Hold Ativan -Continue escitalopram -Continue trazodone  Acute on chronic anemia Post-op blood loss anemia.  Transfused 1 unit on 10/3.  Mobility: Hospice patient DVT prophylaxis: Lovenox Code Status:   Code Status: DNR  Family Communication:  Expected Discharge: when hospice bed is available  Consultants:    Procedures:    Antimicrobials: Anti-infectives (From admission, onward)   Start     Dose/Rate Route Frequency Ordered Stop   09/14/19 2100  ceFAZolin (ANCEF) IVPB 2g/100 mL premix     2 g 200 mL/hr over 30 Minutes Intravenous Every 6 hours 09/14/19 1951 09/15/19 0347   09/14/19 1200  ceFAZolin (ANCEF) IVPB 2g/100 mL premix     2 g 200 mL/hr over 30 Minutes Intravenous On call to O.R. 09/14/19 1153 09/14/19 1500  Diet Order            Diet regular Room service appropriate? Yes; Fluid consistency: Thin  Diet effective now              Infusions:  . sodium chloride 10 mL/hr at 09/14/19 2200    Scheduled Meds: . amLODipine  10 mg Oral Daily  . busPIRone  10 mg Oral BID  . cholecalciferol  1,000 Units Oral Daily  . docusate sodium  100 mg Oral BID  . enoxaparin (LOVENOX) injection  40 mg Subcutaneous Q24H  . escitalopram  10 mg Oral Daily  . feeding supplement (ENSURE ENLIVE)  237 mL Oral TID BM  . latanoprost  1 drop Both Eyes QHS  . losartan  100 mg Oral Daily  . loteprednol  1 drop Both Eyes BID  .  methimazole  5 mg Oral Daily  . metoprolol succinate  25 mg Oral Daily  . traZODone  50 mg Oral QHS    PRN meds: sodium chloride, acetaminophen **OR** [DISCONTINUED] acetaminophen, alum & mag hydroxide-simeth, guaiFENesin, hydrALAZINE, magnesium hydroxide, menthol-cetylpyridinium **OR** phenol, methocarbamol, morphine injection, ondansetron **OR** ondansetron (ZOFRAN) IV, oxyCODONE-acetaminophen   Objective: Vitals:   09/20/19 0156 09/20/19 0636  BP: (!) 124/59 (!) 160/58  Pulse: 78 73  Resp: 14 16  Temp: 98.6 F (37 C) 97.9 F (36.6 C)  SpO2: 95% 96%    Intake/Output Summary (Last 24 hours) at 09/20/2019 1259 Last data filed at 09/20/2019 1037 Gross per 24 hour  Intake 720 ml  Output 700 ml  Net 20 ml   Filed Weights   09/14/19 1318 09/14/19 1421 09/17/19 2100  Weight: 60.1 kg 60.1 kg 62.4 kg   Weight change:  Body mass index is 22.2 kg/m.   Physical Exam: General exam: Appears calm and comfortable.  Skin: No rashes, lesions or ulcers. HEENT: Atraumatic, normocephalic, supple neck, no obvious bleeding Lungs: Clear to auscultation bilaterally CVS: Regular rate and rhythm GI/Abd soft, nondistended, nontender, bowel sound present CNS: Opens eyes on verbal command. Psychiatry: Mood appropriate Extremities: No pedal edema, no calf tenderness  Data Review: I have personally reviewed the laboratory data and studies available.  Recent Labs  Lab 09/13/19 2228  09/16/19 1117 09/17/19 0741 09/17/19 1528 09/18/19 0235 09/19/19 0311 09/20/19 0304  WBC 11.0*   < > 11.1* 8.3  --  8.2 9.6 8.7  NEUTROABS 8.7*  --  6.1  --   --   --   --   --   HGB 11.1*   < > 7.4* 6.7* 8.1* 9.5* 9.2* 8.9*  HCT 34.2*   < > 23.2* 21.4* 25.7* 29.3* 29.0* 28.3*  MCV 91.7   < > 92.8 96.4  --  91.3 93.5 92.5  PLT 315   < > 190 269  --  325 400 414*   < > = values in this interval not displayed.   Recent Labs  Lab 09/16/19 0249 09/17/19 0741 09/18/19 0235 09/19/19 0311 09/20/19 0304  NA  138 136 139 138 140  K 3.9 4.7 4.8 4.0 4.8  CL 107 106 108 103 104  CO2 23 23 26 27 27   GLUCOSE 147* 121* 92 111* 116*  BUN 26* 14 14 28* 31*  CREATININE 0.78 0.65 0.63 0.68 0.64  CALCIUM 8.5* 8.5* 9.1 8.9 8.9    Terrilee Croak, MD  [facility] 09/20/2019

## 2019-09-20 NOTE — Plan of Care (Signed)
  Problem: Education: Goal: Knowledge of General Education information will improve Description: Including pain rating scale, medication(s)/side effects and non-pharmacologic comfort measures Outcome: Progressing   Problem: Health Behavior/Discharge Planning: Goal: Ability to manage health-related needs will improve Outcome: Progressing   Problem: Clinical Measurements: Goal: Ability to maintain clinical measurements within normal limits will improve Outcome: Progressing Goal: Will remain free from infection Outcome: Progressing Goal: Diagnostic test results will improve Outcome: Progressing   Problem: Activity: Goal: Risk for activity intolerance will decrease Outcome: Progressing   Problem: Nutrition: Goal: Adequate nutrition will be maintained Outcome: Progressing   Problem: Coping: Goal: Level of anxiety will decrease Outcome: Progressing   Problem: Elimination: Goal: Will not experience complications related to bowel motility Outcome: Progressing   Problem: Pain Managment: Goal: General experience of comfort will improve Outcome: Progressing   Problem: Safety: Goal: Ability to remain free from injury will improve Outcome: Progressing   Problem: Skin Integrity: Goal: Risk for impaired skin integrity will decrease Outcome: Progressing   Problem: Education: Goal: Verbalization of understanding the information provided (i.e., activity precautions, restrictions, etc) will improve Outcome: Progressing Goal: Individualized Educational Video(s) Outcome: Progressing   Problem: Activity: Goal: Ability to ambulate and perform ADLs will improve Outcome: Progressing   Problem: Clinical Measurements: Goal: Postoperative complications will be avoided or minimized Outcome: Progressing   Problem: Self-Concept: Goal: Ability to maintain and perform role responsibilities to the fullest extent possible will improve Outcome: Progressing   Problem: Pain  Management: Goal: Pain level will decrease Outcome: Progressing

## 2019-09-20 NOTE — Progress Notes (Signed)
Manufacturing engineer [facility]) Hospital Liaison note.  [place] does not have any beds today.  Will update if bed availability changes.  Farrel Gordon,  RN, CCM [facility] Liaison (listed on [place]) [phone]

## 2019-09-21 NOTE — Discharge Summary (Signed)
Physician Discharge Summary  Lisa [name] [id number] DOB: 05-Jul-1928 DOA: 09/13/2019  PCP: Patient, No Pcp Per  Admit date: 09/13/2019 Discharge date: 09/21/2019  Admitted From: Home hospice Discharge disposition: Hospice home   Code Status: DNR  Diet Recommendation: As tolerated   Recommendations for Outpatient Follow-Up:   1. Per hospice policy  Discharge Diagnosis:   Principal Problem:   Closed fracture of shaft of left femur ([place]) Active Problems:   Essential hypertension   Alzheimer disease ([place])   Depression   Fall   Leukocytosis   Hyperthyroidism    History of Present Illness / Brief narrative:  Lisa [name] a 85 y.o.Fwith advanced dementia, in memory care, under hospice, per report, blindness, hyperthyroidism and HTN who presented with unwitnessed fall and leg pain.  In the ER, she was found to have BP 205/158 mmHg, radiograph showing left distal femur fracture.  Subjective:  Seen and examined this morning.  Pleasant elderly Caucasian female.  Sleeping.  Opens eyes and follow command.  Not in distress.  Hospital Course:  Femur fracture s/p internal fixation on 9/30 by Dr. Delfino Lovett. Failure to thrive Patient was on Hospice prior to admission for dementia. This was revoked for operation.   Post-operative course has been complicated by blood loss anemia requiring transfusion, and delirium and failure to thrive (she had lost 30 lbs before admission, and since surgery has had inadequate oral intake of fluids calories to maintain hydration and healing). Family agreed she would not want to prolong life artificially with fluids and feeding tube. At this time, we recommend to continue pain control, supportive care, but we do not start IV fluids, push NG tube for nutrition, and we contact Hospice to make arrangements for discharge either to residential hospice, or back to ALF with Hospice following.  Dementia Delirium The patient is oriented to self only at  baseline.Per family, she knows family, but not oriented to location or year or current events, can only respond to conversations, not initiate them, can make needs known, but has been losing weight in last 6 months, was enrolled in Hospice with [facility] a few months ago.  Here, nursing report she islabile of attention. At times able to make needs known but confused, other times agitated at other times somnolent.   Has to be fed. The most she has eaten last 48 hours is one applesauce and part of Ensure.  Delirium precautions:  -Lights and TV off, minimize interruptions at night -Blinds open and lights on during day -Glasses/hearing aid with patient -Frequent reorientation -PT/OT when able -Avoid sedation medications/Beers list medicationsincluding Ativan  Hypertension BP mostly controlled -Continueamlodipine, losartan, metoprolol  Hyperthyroidism -Continuemethimazole  Anxiety -ContinueBuspar -Continue to holdAtivan -Continueescitalopram -Continuetrazodone  Acute on chronic anemia Post-op blood loss anemia. Transfused 1 unit on 10/3.  Okay to discharge to inpatient hospice today.  Discharge Exam:   Vitals:   09/20/19 1426 09/20/19 2224 09/21/19 0528 09/21/19 0935  BP: (!) 141/53 (!) 145/92 (!) 134/47 (!) 137/51  Pulse: 74 66 66 69  Resp: 15 16 18    Temp:  98.1 F (36.7 C) 97.9 F (36.6 C)   TempSrc:      SpO2: 99% 97% 95% 99%  Weight:      Height:        Body mass index is 22.2 kg/m.  General exam: Appears calm and comfortable.  Skin: No rashes, lesions or ulcers. HEENT: Atraumatic, normocephalic, supple neck, no obvious bleeding Lungs: Clear to auscultation bilaterally CVS: Regular rate  and rhythm, no murmur GI/Abd soft, nontender, nondistended, bowel sound present CNS:  sleep, opens eyes on verbal command, demented Psychiatry: Mood  appropriate Extremities: No pedal edema, no calf tenderness  Discharge Instructions:  Wound care: None Discharge Instructions    Increase activity slowly   Complete by: As directed      Follow-up Information    Swinteck, Aaron Edelman, MD. Schedule an appointment as soon as possible for a visit in 2 weeks.   Specialty: Orthopedic Surgery Why: For wound re-check, For suture removal Contact information: [address] [place] [address] [phone]          Allergies as of 09/21/2019      Reactions   Aricept [donepezil Hydrochloride] Other (See Comments)   Syncope NOT ON [facility] 07/03/18      Medication List    TAKE these medications   acetaminophen 500 MG tablet Commonly known as: TYLENOL Take 500 mg by mouth every 4 (four) hours as needed for mild pain, fever or headache.   acetaminophen 325 MG tablet Commonly known as: Tylenol Take 2 tablets (650 mg total) by mouth every 6 (six) hours as needed for moderate pain.   amLODipine 10 MG tablet Commonly known as: NORVASC Take 10 mg by mouth daily.   amLODipine 5 MG tablet Commonly known as: NORVASC Take 1 tablet (5 mg total) by mouth daily.   Biofreeze 4 % Gel Generic drug: Menthol (Topical Analgesic) Apply 1 application topically 3 (three) times daily as needed (left hip pain).   busPIRone 10 MG tablet Commonly known as: BUSPAR Take 10 mg by mouth 2 (two) times daily.   cephALEXin 500 MG capsule Commonly known as: KEFLEX Take 1 capsule (500 mg total) by mouth 2 (two) times daily.   enoxaparin 40 MG/0.4ML injection Commonly known as: LOVENOX Inject 0.4 mLs (40 mg total) into the skin daily.   erythromycin ophthalmic ointment Place a 1/2 inch ribbon of ointment into the lower eyelid 3 times daily for 1 week   escitalopram 10 MG tablet Commonly known as: LEXAPRO Take 10 mg by mouth daily.   guaifenesin 100 MG/5ML syrup Commonly known as: ROBITUSSIN Take 200 mg by mouth every 6 (six) hours as  needed for cough. Not to exceed 4 doses in 24 hours   loperamide 2 MG capsule Commonly known as: IMODIUM Take 2 mg by mouth as needed for diarrhea or loose stools (Do not exceed 8 doses in 24 hours.).   LORazepam 0.5 MG tablet Commonly known as: ATIVAN Take 0.5 mg by mouth 2 (two) times daily.   LORazepam 0.5 MG tablet Commonly known as: ATIVAN Take 0.5 mg by mouth every 6 (six) hours as needed for anxiety.   losartan 100 MG tablet Commonly known as: COZAAR TAKE 1 TABLET BY MOUTH EVERY DAY   Lotemax 0.5 % Oint Generic drug: Loteprednol Etabonate Place 1 strip into both eyes 2 (two) times daily.   magnesium hydroxide 400 MG/5ML suspension Commonly known as: MILK OF MAGNESIA Take 30 mLs by mouth at bedtime as needed for mild constipation.   methimazole 5 MG tablet Commonly known as: TAPAZOLE Take 5 mg by mouth daily.   metoprolol succinate 25 MG 24 hr tablet Commonly known as: TOPROL-XL Take 25 mg by mouth daily.   Mintox [id number] MG/5ML suspension Generic drug: alum & mag hydroxide-simeth Take 30 mLs by mouth every 6 (six) hours as needed for indigestion or heartburn (Do not exceed 4 doses in 24 hours.).   neomycin-bacitracin-polymyxin ointment  Commonly known as: NEOSPORIN Apply 1 application topically as needed for wound care.   OVER THE COUNTER MEDICATION Take 1 Can by mouth 4 (four) times daily. House Shake   oxyCODONE-acetaminophen 5-325 MG tablet Commonly known as: PERCOCET/ROXICET Take 1 tablet by mouth every 4 (four) hours as needed for moderate pain.   Travatan Z 0.004 % Soln ophthalmic solution Generic drug: Travoprost (BAK Free) Place 2 drops into both eyes daily.   traZODone 50 MG tablet Commonly known as: DESYREL Take 1-2 tablets (50-100 mg total) by mouth at bedtime. What changed: how much to take   Vitamin D3 25 MCG (1000 UT) Caps Take 1 capsule by mouth daily.       Time coordinating discharge: 35 minutes  The results of significant  diagnostics from this hospitalization (including imaging, microbiology, ancillary and laboratory) are listed below for reference.    Procedures and Diagnostic Studies:   Dg Chest 2 View  Result Date: 09/13/2019 CLINICAL DATA:  Left femur fracture. Fall today. EXAM: CHEST - 2 VIEW COMPARISON:  Radiograph 03/31/2019 FINDINGS: Patient had difficulty with positioning and could not lay flat. Rotated exam. Unchanged heart size and mediastinal contours allowing for positioning. No acute airspace disease, large pleural effusion or pneumothorax. No acute osseous abnormalities are seen. IMPRESSION: Rotated exam without acute abnormality. Electronically Signed   By: Keith Rake M.D.   On: 09/13/2019 21:29   Ct Head Wo Contrast  Result Date: 09/13/2019 CLINICAL DATA:  85 year old female status post unwitnessed fall. Left femur fracture. Altered mental status, combative. EXAM: CT HEAD WITHOUT CONTRAST CT CERVICAL SPINE WITHOUT CONTRAST TECHNIQUE: Multidetector CT imaging of the head and cervical spine was performed following the standard protocol without intravenous contrast. Multiplanar CT image reconstructions of the cervical spine were also generated. COMPARISON:  Head and cervical Spine CT 03/31/2019. FINDINGS: CT HEAD FINDINGS Brain: Stable cerebral volume. Stable gray-white matter differentiation throughout the brain. Mild to moderate for age patchy white matter hypodensity. No midline shift, ventriculomegaly, mass effect, evidence of mass lesion, intracranial hemorrhage or evidence of cortically based acute infarction. No cortical encephalomalacia. Vascular: Mild Calcified atherosclerosis at the skull base. No suspicious intracranial vascular hyperdensity. Skull: Stable and intact. Sinuses/Orbits: Visualized paranasal sinuses and mastoids are stable and well pneumatized. Other: No acute orbit or scalp soft tissue finding. CT CERVICAL SPINE FINDINGS Motion artifact, most pronounced at C1-C2. Alignment:  Stable since April, mild degenerative appearing anterolisthesis at C4-C5 and C7-T1. Bilateral posterior element alignment is within normal limits. Skull base and vertebrae: Motion artifact at the skull base, C1 and C2. Chronic degenerative changes there. No acute osseous abnormality identified. Soft tissues and spinal canal: No prevertebral fluid or swelling. No visible canal hematoma. Disc levels: Stable. Chronic severe C1-C2 degeneration. Widespread cervical facet arthropathy. Chronic severe C6-C7 disc and endplate degeneration. Upper chest: Visible upper thoracic levels appear intact. Negative lung apices. IMPRESSION: 1. No acute traumatic injury identified in the head or cervical spine. Detail of C1 and C2 degraded by motion. 2. Stable non contrast CT appearance of the brain since April. Mild to moderate white matter changes most commonly due to small vessel disease. 3. Chronically advanced cervical spine degeneration. Electronically Signed   By: Genevie Ann M.D.   On: 09/13/2019 23:52   Ct Cervical Spine Wo Contrast  Result Date: 09/13/2019 CLINICAL DATA:  85 year old female status post unwitnessed fall. Left femur fracture. Altered mental status, combative. EXAM: CT HEAD WITHOUT CONTRAST CT CERVICAL SPINE WITHOUT CONTRAST TECHNIQUE: Multidetector CT imaging of the  head and cervical spine was performed following the standard protocol without intravenous contrast. Multiplanar CT image reconstructions of the cervical spine were also generated. COMPARISON:  Head and cervical Spine CT 03/31/2019. FINDINGS: CT HEAD FINDINGS Brain: Stable cerebral volume. Stable gray-white matter differentiation throughout the brain. Mild to moderate for age patchy white matter hypodensity. No midline shift, ventriculomegaly, mass effect, evidence of mass lesion, intracranial hemorrhage or evidence of cortically based acute infarction. No cortical encephalomalacia. Vascular: Mild Calcified atherosclerosis at the skull base. No  suspicious intracranial vascular hyperdensity. Skull: Stable and intact. Sinuses/Orbits: Visualized paranasal sinuses and mastoids are stable and well pneumatized. Other: No acute orbit or scalp soft tissue finding. CT CERVICAL SPINE FINDINGS Motion artifact, most pronounced at C1-C2. Alignment: Stable since April, mild degenerative appearing anterolisthesis at C4-C5 and C7-T1. Bilateral posterior element alignment is within normal limits. Skull base and vertebrae: Motion artifact at the skull base, C1 and C2. Chronic degenerative changes there. No acute osseous abnormality identified. Soft tissues and spinal canal: No prevertebral fluid or swelling. No visible canal hematoma. Disc levels: Stable. Chronic severe C1-C2 degeneration. Widespread cervical facet arthropathy. Chronic severe C6-C7 disc and endplate degeneration. Upper chest: Visible upper thoracic levels appear intact. Negative lung apices. IMPRESSION: 1. No acute traumatic injury identified in the head or cervical spine. Detail of C1 and C2 degraded by motion. 2. Stable non contrast CT appearance of the brain since April. Mild to moderate white matter changes most commonly due to small vessel disease. 3. Chronically advanced cervical spine degeneration. Electronically Signed   By: Genevie Ann M.D.   On: 09/13/2019 23:52   Dg C-arm 1-60 Min-no Report  Result Date: 09/14/2019 CLINICAL DATA:  Status post ORIF left femur fracture EXAM: LEFT FEMUR PORTABLE 2 VIEWS; DG C-ARM 1-60 MIN-NO REPORT CONTRAST:  None FLUOROSCOPY TIME:  Fluoroscopy Time:  2 minutes 45 seconds Radiation Exposure Index (if provided by the fluoroscopic device): Not available Number of Acquired Spot Images: 0 COMPARISON:  Earlier today FINDINGS: There is been interval open reduction and internal fixation of the mid distal left femur fracture. Cerclage wires and sideplate and screw device have been used to reduce the fracture. Fracture fragments are in anatomic alignment. Previous left knee  arthroplasty and left hip arthroplasty. IMPRESSION: 1. Status post cerclage wire and screw and plate fixation of the mid to distal left femur fracture. Electronically Signed   By: Kerby Moors M.D.   On: 09/14/2019 19:06   Dg Hip Unilat With Pelvis 2-3 Views Left  Result Date: 09/13/2019 CLINICAL DATA:  Fall at care facility earlier today. Left hip/femur pain. Deformity. EXAM: DG HIP (WITH OR WITHOUT PELVIS) 2-3V LEFT COMPARISON:  Concurrent femur radiograph. FINDINGS: Patient had difficulty with positioning, with combative, and could not lie flat. Left hip arthroplasty is intact. No periprosthetic lucency or fracture. Mid-distal femoral shaft fracture better visualized on concurrent femur exam. IMPRESSION: 1. Intact left hip arthroplasty. No periprosthetic fracture. 2. Mid-distal femoral shaft fracture better visualized on concurrent femur exam. 3. Limited evaluation due to patient medical condition. Electronically Signed   By: Keith Rake M.D.   On: 09/13/2019 21:27   Dg Femur Min 2 Views Left  Result Date: 09/13/2019 CLINICAL DATA:  85 year old female status post fall today. Combative. EXAM: LEFT FEMUR 2 VIEWS COMPARISON:  Left hip series reported separately. FINDINGS: Left hip details are reported separately. Superimposed left total knee arthroplasty. Long segment spiral fracture of the distal left femoral shaft terminating about 4 centimeters proximal to the distal  femoral hardware. 1/2 shaft width medial displacement, posterior displacement, as well as over riding and posterior angulation. Grossly aligned total knee arthroplasty components. IMPRESSION: 1. Long segment spiral fracture of the distal left femoral shaft with 1/2 shaft width displacement, over-riding, and posterior angulation. 2. Superimposed left total knee arthroplasty appears intact. 3. Left hip details reported separately. Electronically Signed   By: Genevie Ann M.D.   On: 09/13/2019 21:30   Dg Femur Port Min 2 Views Left  Result  Date: 09/14/2019 CLINICAL DATA:  85 year old female status post distal left femur ORIF. EXAM: LEFT FEMUR PORTABLE 2 VIEWS COMPARISON:  Intraoperative images 16 21 hours today. FINDINGS: Three portable views at 2018 hours. Along segment lateral plate with multiple screws and cerclage wires has been placed along the left femoral shaft superimposed on preexisting left total knee arthroplasty and left hip arthroplasty. Near anatomic alignment. Overlying skin staples. No new osseous abnormality identified. IMPRESSION: 1. ORIF of the left femur with no adverse features. 2. Pre-existing left hip and knee arthroplasties. Electronically Signed   By: Genevie Ann M.D.   On: 09/14/2019 20:39   Dg Femur Port Min 2 Views Left  Result Date: 09/14/2019 CLINICAL DATA:  Status post ORIF left femur fracture EXAM: LEFT FEMUR PORTABLE 2 VIEWS; DG C-ARM 1-60 MIN-NO REPORT CONTRAST:  None FLUOROSCOPY TIME:  Fluoroscopy Time:  2 minutes 45 seconds Radiation Exposure Index (if provided by the fluoroscopic device): Not available Number of Acquired Spot Images: 0 COMPARISON:  Earlier today FINDINGS: There is been interval open reduction and internal fixation of the mid distal left femur fracture. Cerclage wires and sideplate and screw device have been used to reduce the fracture. Fracture fragments are in anatomic alignment. Previous left knee arthroplasty and left hip arthroplasty. IMPRESSION: 1. Status post cerclage wire and screw and plate fixation of the mid to distal left femur fracture. Electronically Signed   By: Kerby Moors M.D.   On: 09/14/2019 19:06     Labs:   Basic Metabolic Panel: Recent Labs  Lab 09/16/19 0249 09/17/19 0741 09/18/19 0235 09/19/19 0311 09/20/19 0304  NA 138 136 139 138 140  K 3.9 4.7 4.8 4.0 4.8  CL 107 106 108 103 104  CO2 23 23 26 27 27   GLUCOSE 147* 121* 92 111* 116*  BUN 26* 14 14 28* 31*  CREATININE 0.78 0.65 0.63 0.68 0.64  CALCIUM 8.5* 8.5* 9.1 8.9 8.9   GFR Estimated Creatinine  Clearance: 44.6 mL/min (by C-G formula based on SCr of 0.64 mg/dL). Liver Function Tests: No results for input(s): AST, ALT, ALKPHOS, BILITOT, PROT, ALBUMIN in the last 168 hours. No results for input(s): LIPASE, AMYLASE in the last 168 hours. No results for input(s): AMMONIA in the last 168 hours. Coagulation profile No results for input(s): INR, PROTIME in the last 168 hours.  CBC: Recent Labs  Lab 09/16/19 1117 09/17/19 0741 09/17/19 1528 09/18/19 0235 09/19/19 0311 09/20/19 0304  WBC 11.1* 8.3  --  8.2 9.6 8.7  NEUTROABS 6.1  --   --   --   --   --   HGB 7.4* 6.7* 8.1* 9.5* 9.2* 8.9*  HCT 23.2* 21.4* 25.7* 29.3* 29.0* 28.3*  MCV 92.8 96.4  --  91.3 93.5 92.5  PLT 190 269  --  325 400 414*   Cardiac Enzymes: No results for input(s): CKTOTAL, CKMB, CKMBINDEX, TROPONINI in the last 168 hours. BNP: Invalid input(s): POCBNP CBG: No results for input(s): GLUCAP in the last 168 hours. D-Dimer  No results for input(s): DDIMER in the last 72 hours. Hgb A1c No results for input(s): HGBA1C in the last 72 hours. Lipid Profile No results for input(s): CHOL, HDL, LDLCALC, TRIG, CHOLHDL, LDLDIRECT in the last 72 hours. Thyroid function studies No results for input(s): TSH, T4TOTAL, T3FREE, THYROIDAB in the last 72 hours.  Invalid input(s): FREET3 Anemia work up No results for input(s): VITAMINB12, FOLATE, FERRITIN, TIBC, IRON, RETICCTPCT in the last 72 hours. Microbiology Recent Results (from the past 240 hour(s))  SARS Coronavirus 2 [facility] order, Performed in [facility] hospital lab) Nasopharyngeal Nasopharyngeal Swab     Status: None   Collection Time: 09/13/19 10:32 PM   Specimen: Nasopharyngeal Swab  Result Value Ref Range Status   SARS Coronavirus 2 NEGATIVE NEGATIVE Final    Comment: (NOTE) If result is NEGATIVE SARS-CoV-2 target nucleic acids are NOT DETECTED. The SARS-CoV-2 RNA is generally detectable in upper and lower  respiratory specimens during the acute  phase of infection. The lowest  concentration of SARS-CoV-2 viral copies this assay can detect is 250  copies / mL. A negative result does not preclude SARS-CoV-2 infection  and should not be used as the sole basis for treatment or other  patient management decisions.  A negative result may occur with  improper specimen collection / handling, submission of specimen other  than nasopharyngeal swab, presence of viral mutation(s) within the  areas targeted by this assay, and inadequate number of viral copies  (<250 copies / mL). A negative result must be combined with clinical  observations, patient history, and epidemiological information. If result is POSITIVE SARS-CoV-2 target nucleic acids are DETECTED. The SARS-CoV-2 RNA is generally detectable in upper and lower  respiratory specimens dur ing the acute phase of infection.  Positive  results are indicative of active infection with SARS-CoV-2.  Clinical  correlation with patient history and other diagnostic information is  necessary to determine patient infection status.  Positive results do  not rule out bacterial infection or co-infection with other viruses. If result is PRESUMPTIVE POSTIVE SARS-CoV-2 nucleic acids MAY BE PRESENT.   A presumptive positive result was obtained on the submitted specimen  and confirmed on repeat testing.  While 2019 novel coronavirus  (SARS-CoV-2) nucleic acids may be present in the submitted sample  additional confirmatory testing may be necessary for epidemiological  and / or clinical management purposes  to differentiate between  SARS-CoV-2 and other Sarbecovirus currently known to infect humans.  If clinically indicated additional testing with an alternate test  methodology [phone]) is advised. The SARS-CoV-2 RNA is generally  detectable in upper and lower respiratory sp ecimens during the acute  phase of infection. The expected result is Negative. Fact Sheet for Patients:   StrictlyIdeas.no Fact Sheet for Healthcare Providers: BankingDealers.co.za This test is not yet approved or cleared by the [place] FDA and has been authorized for detection and/or diagnosis of SARS-CoV-2 by FDA under an Emergency Use Authorization (EUA).  This EUA will remain in effect (meaning this test can be used) for the duration of the COVID-19 declaration under Section 564(b)(1) of the Act, 21 U.S.C. section 360bbb-3(b)(1), unless the authorization is terminated or revoked sooner. Performed at [facility], [place] [address], [place], [place] [address]   Surgical pcr screen     Status: Abnormal   Collection Time: 09/14/19 12:14 PM   Specimen: Nasal Mucosa; Nasal Swab  Result Value Ref Range Status   MRSA, PCR POSITIVE (A) NEGATIVE Final    Comment:  RESULT CALLED TO, READ BACK BY AND VERIFIED WITH:  DANA FRANKLIN 09/14/19 1428 JM    Staphylococcus aureus POSITIVE (A) NEGATIVE Final    Comment: (NOTE) The Xpert SA Assay (FDA approved for NASAL specimens in patients 61 years of age and older), is one component of a comprehensive surveillance program. It is not intended to diagnose infection nor to guide or monitor treatment. Performed at [facility], [place] [address] [place], [place] [address]     Please note: You were cared for by a hospitalist during your hospital stay. Once you are discharged, your primary care physician will handle any further medical issues. Please note that NO REFILLS for any discharge medications will be authorized once you are discharged, as it is imperative that you return to your primary care physician (or establish a relationship with a primary care physician if you do not have one) for your post hospital discharge needs so that they can reassess your need for medications and monitor your lab values.  Signed: Terrilee Croak  [facility] 09/21/2019, 1:06  PM

## 2019-09-21 NOTE — Plan of Care (Signed)

## 2019-09-21 NOTE — Progress Notes (Signed)
[employer] available today. Plan to complete paper work with daughter Juliann Pulse at 1:00 at [employer].   Please send discharge summary to [phone].  RN please call report to [phone] prior to patient leaving the unit.   Thank you,  Erling Conte, LCSW [phone]

## 2019-09-21 NOTE — Plan of Care (Signed)
  Problem: Education: Goal: Knowledge of General Education information will improve Description: Including pain rating scale, medication(s)/side effects and non-pharmacologic comfort measures 09/21/2019 1436 by Hubert Azure, RN Outcome: Progressing 09/21/2019 1255 by Hubert Azure, RN Outcome: Progressing   Problem: Health Behavior/Discharge Planning: Goal: Ability to manage health-related needs will improve 09/21/2019 1436 by Hubert Azure, RN Outcome: Progressing 09/21/2019 1255 by Hubert Azure, RN Outcome: Progressing   Problem: Clinical Measurements: Goal: Ability to maintain clinical measurements within normal limits will improve 09/21/2019 1436 by Hubert Azure, RN Outcome: Progressing 09/21/2019 1255 by Hubert Azure, RN Outcome: Progressing Goal: Will remain free from infection 09/21/2019 1436 by Hubert Azure, RN Outcome: Progressing 09/21/2019 1255 by Hubert Azure, RN Outcome: Progressing Goal: Diagnostic test results will improve 09/21/2019 1436 by Hubert Azure, RN Outcome: Progressing 09/21/2019 1255 by Hubert Azure, RN Outcome: Progressing   Problem: Activity: Goal: Risk for activity intolerance will decrease 09/21/2019 1436 by Hubert Azure, RN Outcome: Progressing 09/21/2019 1255 by Hubert Azure, RN Outcome: Progressing   Problem: Nutrition: Goal: Adequate nutrition will be maintained 09/21/2019 1436 by Hubert Azure, RN Outcome: Progressing 09/21/2019 1255 by Hubert Azure, RN Outcome: Progressing   Problem: Coping: Goal: Level of anxiety will decrease Outcome: Progressing   Problem: Elimination: Goal: Will not experience complications related to bowel motility Outcome: Progressing   Problem: Pain Managment: Goal: General experience of comfort will improve Outcome: Progressing   Problem: Safety: Goal: Ability to remain free from injury will improve Outcome: Progressing   Problem: Skin Integrity: Goal: Risk for impaired  skin integrity will decrease Outcome: Progressing   Problem: Education: Goal: Verbalization of understanding the information provided (i.e., activity precautions, restrictions, etc) will improve Outcome: Progressing Goal: Individualized Educational Video(s) Outcome: Progressing   Problem: Activity: Goal: Ability to ambulate and perform ADLs will improve Outcome: Progressing   Problem: Clinical Measurements: Goal: Postoperative complications will be avoided or minimized Outcome: Progressing   Problem: Self-Concept: Goal: Ability to maintain and perform role responsibilities to the fullest extent possible will improve Outcome: Progressing   Problem: Pain Management: Goal: Pain level will decrease Outcome: Progressing

## 2019-09-21 NOTE — TOC Transition Note (Addendum)
Transition of Care [facility]) - CM/SW Discharge Note   Patient Details  Name: Aleera Binkowski MRN: [id number] Date of Birth: July 31, 1928  [facility] CM/SW Contact:  Lia Hopping, [place] Phone Number: 09/21/2019, 1:00 PM   Clinical Narrative:    Patient will transfer to [facility] today.  Dr. Juliann Pulse scheduled to complete admission paperwork at 1:00pm today.  CSW notified physician.  D/C Summary faxed to: [phone]   Final next level of care: [place] Barriers to Discharge: No Barriers Identified   Patient Goals and CMS Choice Patient states their goals for this hospitalization and ongoing recovery are:: Daughter: to determine how she will recover this time at [facility] CMS Medicare.gov Compare Post Acute Care list provided to:: Other (Comment Required)(Daughter) Choice offered to / list presented to : Adult Children  Discharge Placement                Patient to be transferred to facility by: [place] Name of family member notified: Daughter: Juliann Pulse Patient and family notified of of transfer: 09/21/19  Discharge Plan and Services In-house Referral: Clinical Social Work Discharge Planning Services: CM Consult Post Acute Care Choice: Nursing Home, [place]                               Social Determinants of Health (SDOH) Interventions     Readmission Risk Interventions No flowsheet data found.

## 2019-10-16 DEATH — deceased

## 2020-01-17 IMAGING — CR DG HUMERUS 2V *L*
2 series · 2 of 2 positions shown · non-contrast
Comparison: Left wrist series of June 03, 2017

CLINICAL DATA: Status post unwitnessed fall now with guarding of
the left arm and hematoma over the elbow.

EXAM:
LEFT HUMERUS - 2+ VIEW

[x humerus ap left (1 of 2)]
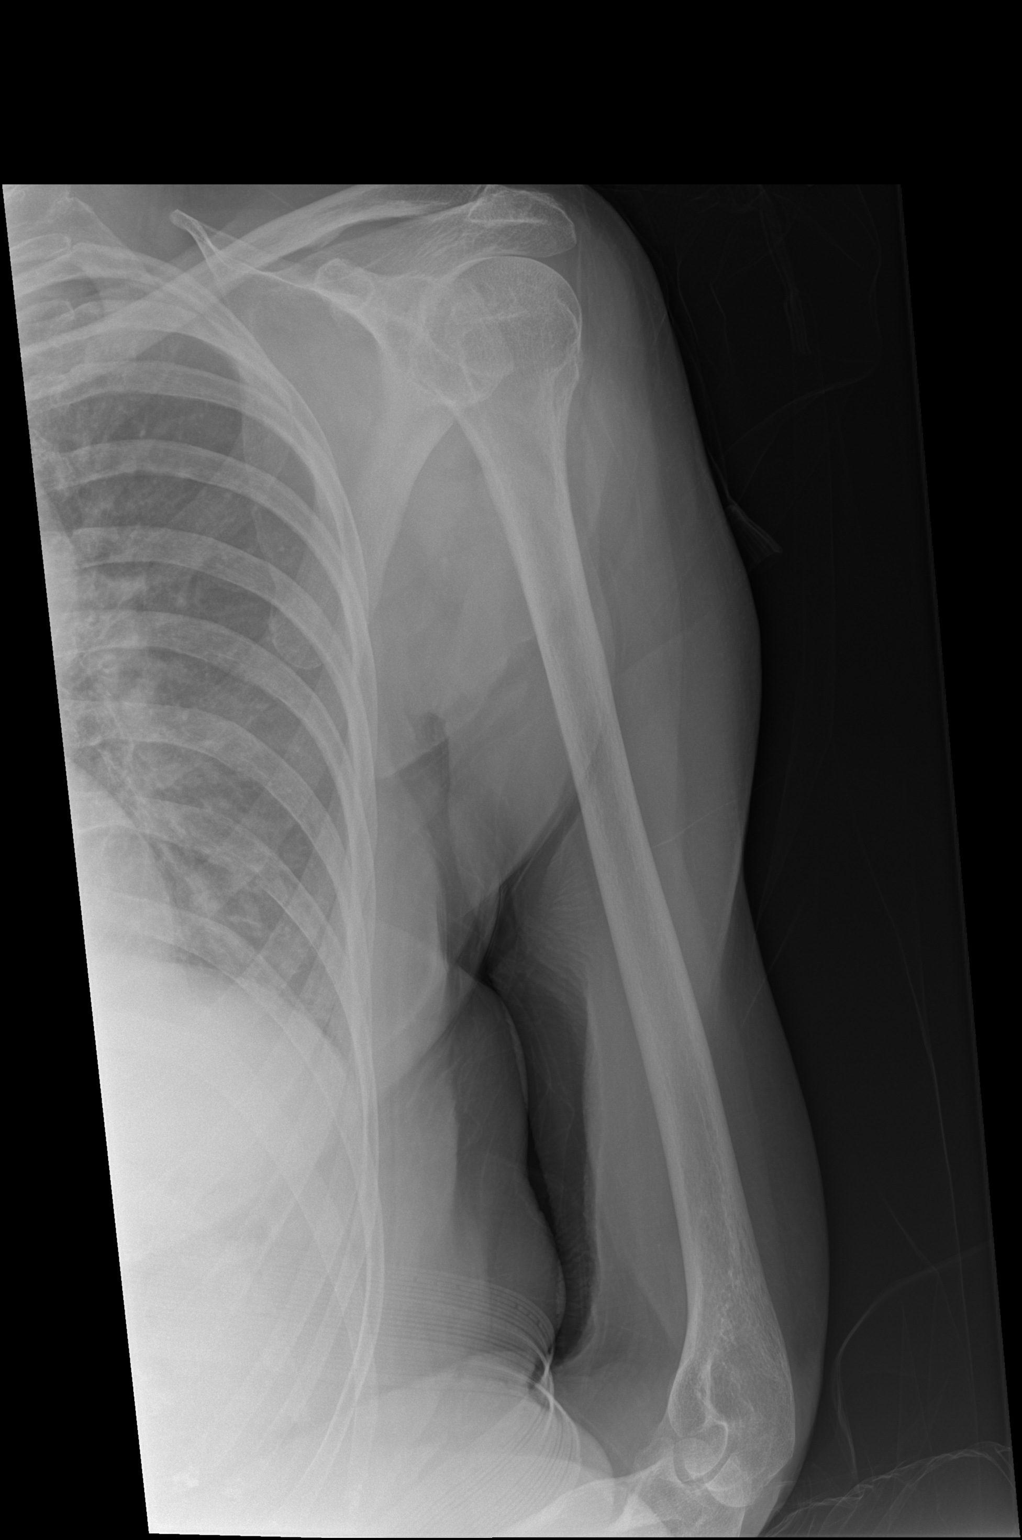

[x humerus ap left (2 of 2)]
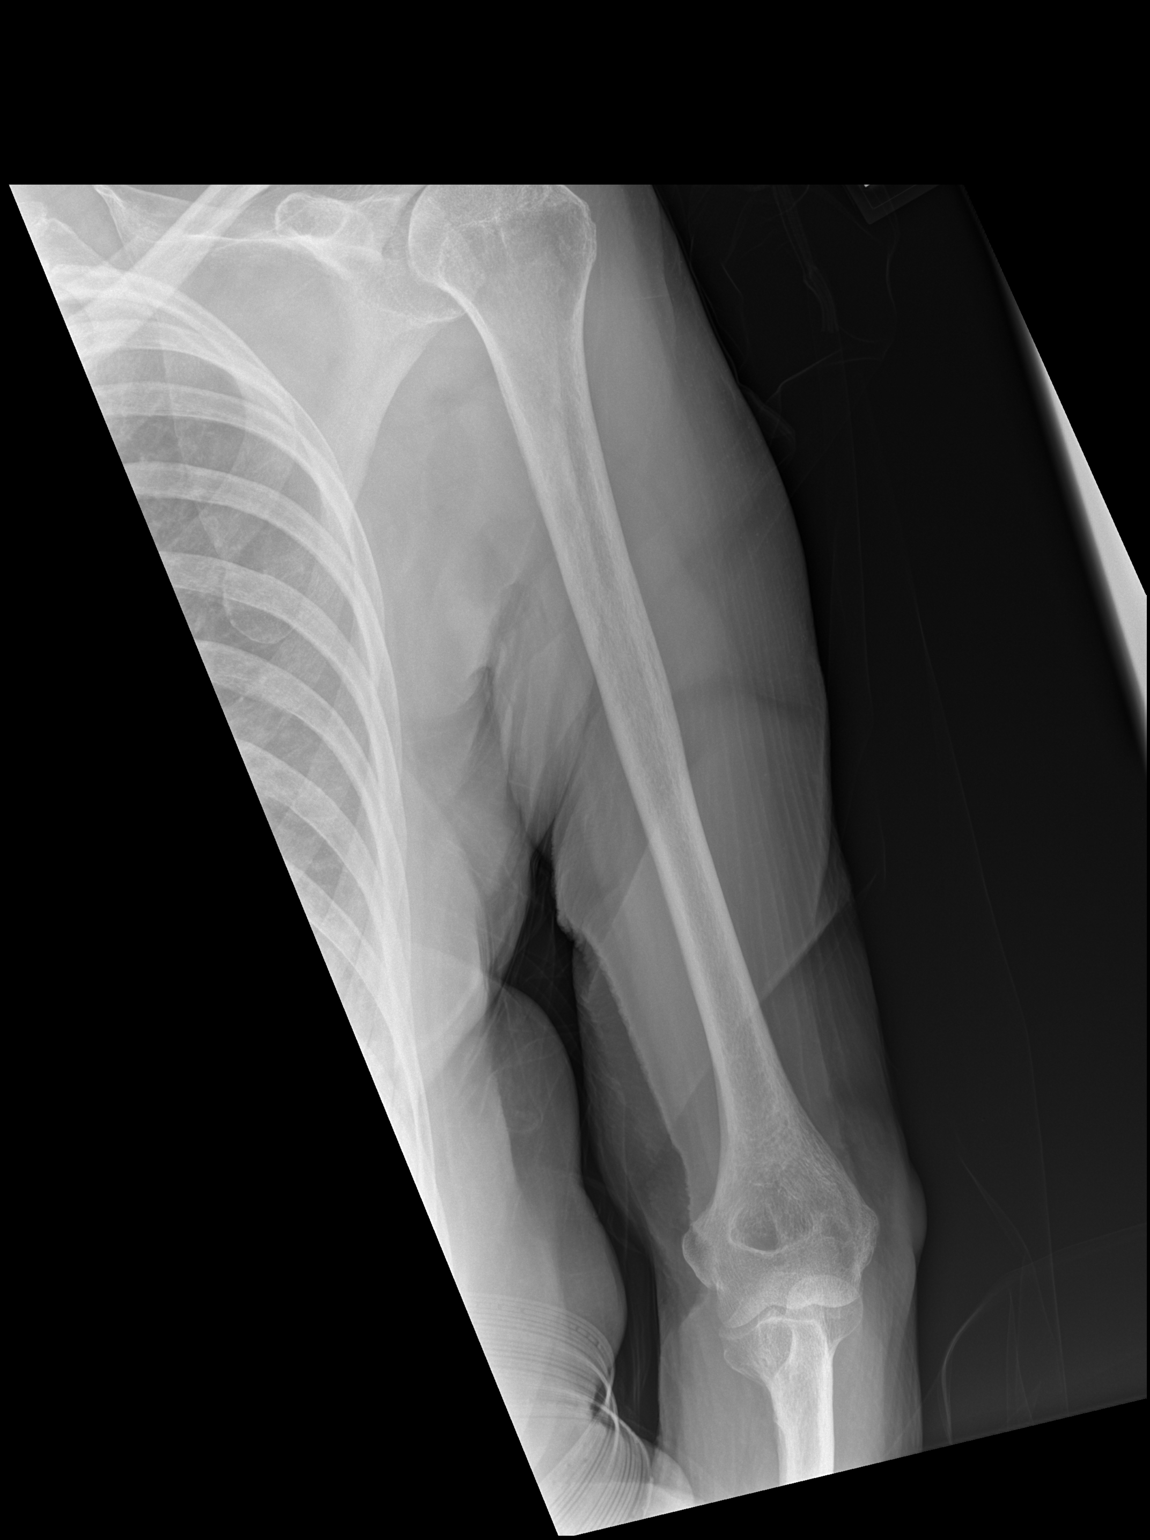

[2 of 2 positions shown; findings below may reference images not displayed]

FINDINGS: The humerus is subjectively adequately mineralized. The glenohumeral
joint space is mildly narrowed. There is some irregularity of the
contour of the lateral aspect of the head of the humerus on one view
which could reflect an impacted fracture.. The limited visualization
of the elbow exhibits no acute abnormality. The soft tissues of the
arm are unremarkable.
IMPRESSION: Possible impacted humeral head fracture laterally. Elsewhere the
humerus is unremarkable. A dedicated shoulder series is recommended.
